# Patient Record
Sex: Female | Born: 1960 | Race: White | Hispanic: No | Marital: Married | Smoking: Current every day smoker
Health system: Southern US, Community
[De-identification: ages and names within clinical notes are randomized; demographics above are authoritative.]

## PROBLEM LIST (undated history)

## (undated) DIAGNOSIS — R569 Unspecified convulsions: Secondary | ICD-10-CM

## (undated) DIAGNOSIS — M5137 Other intervertebral disc degeneration, lumbosacral region: Secondary | ICD-10-CM

## (undated) DIAGNOSIS — M5136 Other intervertebral disc degeneration, lumbar region: Secondary | ICD-10-CM

## (undated) DIAGNOSIS — M51379 Other intervertebral disc degeneration, lumbosacral region without mention of lumbar back pain or lower extremity pain: Secondary | ICD-10-CM

## (undated) DIAGNOSIS — C719 Malignant neoplasm of brain, unspecified: Secondary | ICD-10-CM

## (undated) DIAGNOSIS — C801 Malignant (primary) neoplasm, unspecified: Secondary | ICD-10-CM

## (undated) DIAGNOSIS — K219 Gastro-esophageal reflux disease without esophagitis: Secondary | ICD-10-CM

## (undated) DIAGNOSIS — F419 Anxiety disorder, unspecified: Secondary | ICD-10-CM

## (undated) DIAGNOSIS — M1990 Unspecified osteoarthritis, unspecified site: Secondary | ICD-10-CM

## (undated) HISTORY — DX: Anxiety disorder, unspecified: F41.9

## (undated) HISTORY — DX: Unspecified convulsions: R56.9

## (undated) HISTORY — PX: ABDOMINAL HYSTERECTOMY: SHX81

## (undated) HISTORY — PX: OTHER SURGICAL HISTORY: SHX169

## (undated) HISTORY — PX: CHOLECYSTECTOMY: SHX55

## (undated) HISTORY — PX: APPENDECTOMY: SHX54

## (undated) HISTORY — PX: EXCISIONAL HEMORRHOIDECTOMY: SHX1541

---

## 2011-04-08 ENCOUNTER — Ambulatory Visit: Payer: Self-pay | Admitting: Internal Medicine

## 2011-04-20 ENCOUNTER — Ambulatory Visit: Payer: Self-pay | Admitting: Internal Medicine

## 2011-04-20 LAB — CBC CANCER CENTER
Basophil #: 0 x10 3/mm (ref 0.0–0.1)
Basophil %: 0.6 %
Eosinophil #: 0.2 x10 3/mm (ref 0.0–0.7)
Eosinophil %: 3.8 %
HCT: 37.9 % (ref 35.0–47.0)
HGB: 13 g/dL (ref 12.0–16.0)
Lymphocyte #: 1.8 x10 3/mm (ref 1.0–3.6)
Lymphocyte %: 35.2 %
MCH: 31.4 pg (ref 26.0–34.0)
MCHC: 34.4 g/dL (ref 32.0–36.0)
MCV: 91 fL (ref 80–100)
Monocyte #: 0.5 x10 3/mm (ref 0.0–0.7)
Monocyte %: 9.2 %
Neutrophil #: 2.6 x10 3/mm (ref 1.4–6.5)
Neutrophil %: 51.2 %
Platelet: 242 x10 3/mm (ref 150–440)
RBC: 4.15 10*6/uL (ref 3.80–5.20)
RDW: 13.4 % (ref 11.5–14.5)
WBC: 5.2 x10 3/mm (ref 3.6–11.0)

## 2011-04-20 LAB — COMPREHENSIVE METABOLIC PANEL
Albumin: 3.1 g/dL — ABNORMAL LOW (ref 3.4–5.0)
Alkaline Phosphatase: 102 U/L (ref 50–136)
Anion Gap: 7 (ref 7–16)
BUN: 14 mg/dL (ref 7–18)
Bilirubin,Total: 0.1 mg/dL — ABNORMAL LOW (ref 0.2–1.0)
Calcium, Total: 8.7 mg/dL (ref 8.5–10.1)
Chloride: 105 mmol/L (ref 98–107)
Co2: 31 mmol/L (ref 21–32)
Creatinine: 0.68 mg/dL (ref 0.60–1.30)
EGFR (African American): >60
EGFR (Non-African Amer.): >60
Glucose: 98 mg/dL (ref 65–99)
Osmolality: 285 (ref 275–301)
Potassium: 4 mmol/L (ref 3.5–5.1)
SGOT(AST): 14 U/L — ABNORMAL LOW (ref 15–37)
SGPT (ALT): 34 U/L
Sodium: 143 mmol/L (ref 136–145)
Total Protein: 7.4 g/dL (ref 6.4–8.2)

## 2011-04-20 LAB — APTT: Activated PTT: 31.5 secs (ref 23.6–35.9)

## 2011-04-20 LAB — PROTIME-INR
INR: 0.9
Prothrombin Time: 12.1 secs (ref 11.5–14.7)

## 2011-04-30 ENCOUNTER — Ambulatory Visit: Payer: Self-pay | Admitting: Internal Medicine

## 2011-10-18 ENCOUNTER — Ambulatory Visit: Payer: Self-pay | Admitting: Internal Medicine

## 2011-10-18 LAB — COMPREHENSIVE METABOLIC PANEL
Albumin: 3.7 g/dL (ref 3.4–5.0)
Alkaline Phosphatase: 69 U/L (ref 50–136)
Anion Gap: 10 (ref 7–16)
BUN: 21 mg/dL — ABNORMAL HIGH (ref 7–18)
Bilirubin,Total: 0.2 mg/dL (ref 0.2–1.0)
Calcium, Total: 9.4 mg/dL (ref 8.5–10.1)
Chloride: 102 mmol/L (ref 98–107)
Co2: 30 mmol/L (ref 21–32)
Creatinine: 0.71 mg/dL (ref 0.60–1.30)
EGFR (African American): >60
EGFR (Non-African Amer.): >60
Glucose: 91 mg/dL (ref 65–99)
Osmolality: 286 (ref 275–301)
Potassium: 4.3 mmol/L (ref 3.5–5.1)
SGOT(AST): 12 U/L — ABNORMAL LOW (ref 15–37)
SGPT (ALT): 20 U/L (ref 12–78)
Sodium: 142 mmol/L (ref 136–145)
Total Protein: 7.6 g/dL (ref 6.4–8.2)

## 2011-10-18 LAB — CBC CANCER CENTER
Basophil #: 0 x10 3/mm (ref 0.0–0.1)
Basophil %: 0.5 %
Eosinophil #: 0.2 x10 3/mm (ref 0.0–0.7)
Eosinophil %: 3.8 %
HCT: 41.2 % (ref 35.0–47.0)
HGB: 13.6 g/dL (ref 12.0–16.0)
Lymphocyte #: 2.4 x10 3/mm (ref 1.0–3.6)
Lymphocyte %: 37.3 %
MCH: 30.9 pg (ref 26.0–34.0)
MCHC: 32.9 g/dL (ref 32.0–36.0)
MCV: 94 fL (ref 80–100)
Monocyte #: 0.6 x10 3/mm (ref 0.2–0.9)
Monocyte %: 9.7 %
Neutrophil #: 3.1 x10 3/mm (ref 1.4–6.5)
Neutrophil %: 48.7 %
Platelet: 204 x10 3/mm (ref 150–440)
RBC: 4.39 10*6/uL (ref 3.80–5.20)
RDW: 13.7 % (ref 11.5–14.5)
WBC: 6.3 x10 3/mm (ref 3.6–11.0)

## 2011-10-31 ENCOUNTER — Ambulatory Visit: Payer: Self-pay | Admitting: Internal Medicine

## 2013-02-18 ENCOUNTER — Emergency Department: Payer: Self-pay | Admitting: Emergency Medicine

## 2013-02-18 LAB — CBC
HCT: 39.6 % (ref 35.0–47.0)
HGB: 12.6 g/dL (ref 12.0–16.0)
MCH: 29.6 pg (ref 26.0–34.0)
MCHC: 31.8 g/dL — ABNORMAL LOW (ref 32.0–36.0)
MCV: 93 fL (ref 80–100)
Platelet: 230 10*3/uL (ref 150–440)
RBC: 4.25 10*6/uL (ref 3.80–5.20)
RDW: 13.8 % (ref 11.5–14.5)
WBC: 14.7 10*3/uL — ABNORMAL HIGH (ref 3.6–11.0)

## 2013-02-18 LAB — BASIC METABOLIC PANEL
Anion Gap: 3 — ABNORMAL LOW (ref 7–16)
BUN: 33 mg/dL — ABNORMAL HIGH (ref 7–18)
Calcium, Total: 8.6 mg/dL (ref 8.5–10.1)
Chloride: 104 mmol/L (ref 98–107)
Co2: 30 mmol/L (ref 21–32)
Creatinine: 0.78 mg/dL (ref 0.60–1.30)
EGFR (African American): >60
EGFR (Non-African Amer.): >60
Glucose: 108 mg/dL — ABNORMAL HIGH (ref 65–99)
Osmolality: 282 (ref 275–301)
Potassium: 4 mmol/L (ref 3.5–5.1)
Sodium: 137 mmol/L (ref 136–145)

## 2013-02-18 LAB — TROPONIN I
Troponin-I: <0.02 ng/mL
Troponin-I: <0.02 ng/mL

## 2013-07-04 ENCOUNTER — Other Ambulatory Visit: Payer: Self-pay | Admitting: Obstetrics & Gynecology

## 2013-08-06 ENCOUNTER — Telehealth: Payer: Self-pay | Admitting: *Deleted

## 2013-08-09 MED ORDER — ESTRADIOL 0.52 MG/0.87 GM (0.06%) TD GEL: 1.0000 "application " | Freq: Every day | TRANSDERMAL | Status: DC

## 2013-08-09 NOTE — Telephone Encounter (Signed)
elestrin e prescribed

## 2013-08-20 ENCOUNTER — Encounter: Payer: Self-pay | Admitting: Obstetrics & Gynecology

## 2013-08-20 ENCOUNTER — Ambulatory Visit (INDEPENDENT_AMBULATORY_CARE_PROVIDER_SITE_OTHER): Payer: Medicare Other | Admitting: Obstetrics & Gynecology

## 2013-08-20 VITALS — BP 120/80 | Temp 98.4°F | Ht 64.0 in | Wt 168.0 lb

## 2013-08-20 DIAGNOSIS — N951 Menopausal and female climacteric states: Secondary | ICD-10-CM | POA: Insufficient documentation

## 2013-08-20 MED ORDER — ESTRADIOL 0.52 MG/0.87 GM (0.06%) TD GEL: TRANSDERMAL | Status: DC

## 2013-08-20 NOTE — Progress Notes (Signed)
Patient ID: Jill Harrell, female   DOB: Jan 15, 1961, 53 y.o.   MRN: [id number] Temperature 98.4 F (36.9 C), height 5\' 4"  (1.626 m), weight 168 lb (76.204 kg).  Pt here for refill of elestrin topically Doing well on it, continue two squirts daily  No complaints with it or problems  No burning with urination, frequency or urgency No nausea, vomiting or diarrhea Nor fever chills or other constitutional symptoms   No exam today   Menopausal symptoms: continue elestrin

## 2013-09-18 ENCOUNTER — Other Ambulatory Visit: Payer: Self-pay | Admitting: *Deleted

## 2013-09-18 MED ORDER — ESTRADIOL 0.52 MG/0.87 GM (0.06%) TD GEL: TRANSDERMAL | Status: DC

## 2013-09-24 ENCOUNTER — Telehealth: Payer: Self-pay | Admitting: Obstetrics & Gynecology

## 2013-09-25 MED ORDER — ESTRADIOL 0.52 MG/0.87 GM (0.06%) TD GEL: TRANSDERMAL | Status: DC

## 2013-09-25 NOTE — Telephone Encounter (Signed)
Double her elestrin

## 2013-09-25 NOTE — Telephone Encounter (Signed)
Left message letting pt know Dr. Elonda Husky sent new Rx electronically. Pt to double the Elestrin. [place]

## 2013-10-01 ENCOUNTER — Telehealth: Payer: Self-pay | Admitting: Obstetrics and Gynecology

## 2013-10-01 ENCOUNTER — Telehealth: Payer: Self-pay | Admitting: *Deleted

## 2013-10-01 MED ORDER — ESTRADIOL 2 MG PO TABS: 2.0000 mg | ORAL_TABLET | Freq: Every day | ORAL | Status: DC

## 2013-10-01 NOTE — Telephone Encounter (Signed)
Pt's insurance called and informed me that the pt would need either the tablet or the patch form of the estradiol. The insurance will cover 0.5mg , 1mg , 2mg  strength of the pill and a 90 day supply would only cost the pt around $27 and the weekly or biweekly patch any strength for a 90 day supply would cost the pt around $117. Can we please send this to the pt's pharmacy?

## 2013-10-01 NOTE — Telephone Encounter (Signed)
2mg  estradiol e prescribed

## 2013-10-01 NOTE — Telephone Encounter (Signed)
Pt aware that medication has been sent to the pharmacy.

## 2013-10-03 NOTE — Telephone Encounter (Signed)
Pt states that she had no issues getting her medication and has started taking it as directed.

## 2013-10-08 ENCOUNTER — Emergency Department (HOSPITAL_COMMUNITY): Payer: Medicare Other

## 2013-10-08 ENCOUNTER — Encounter (HOSPITAL_COMMUNITY): Payer: Self-pay | Admitting: Emergency Medicine

## 2013-10-08 ENCOUNTER — Emergency Department (HOSPITAL_COMMUNITY)
Admission: EM | Admit: 2013-10-08 | Discharge: 2013-10-08 | Disposition: A | Discharge status: Home / Self Care | Payer: Medicare Other | Attending: Emergency Medicine | Admitting: Emergency Medicine

## 2013-10-08 DIAGNOSIS — F17200 Nicotine dependence, unspecified, uncomplicated: Secondary | ICD-10-CM | POA: Insufficient documentation

## 2013-10-08 DIAGNOSIS — M129 Arthropathy, unspecified: Secondary | ICD-10-CM | POA: Insufficient documentation

## 2013-10-08 DIAGNOSIS — F411 Generalized anxiety disorder: Secondary | ICD-10-CM | POA: Insufficient documentation

## 2013-10-08 DIAGNOSIS — K5900 Constipation, unspecified: Secondary | ICD-10-CM | POA: Diagnosis not present

## 2013-10-08 DIAGNOSIS — M5430 Sciatica, unspecified side: Secondary | ICD-10-CM | POA: Diagnosis not present

## 2013-10-08 DIAGNOSIS — M545 Low back pain, unspecified: Secondary | ICD-10-CM | POA: Diagnosis present

## 2013-10-08 DIAGNOSIS — M5441 Lumbago with sciatica, right side: Secondary | ICD-10-CM

## 2013-10-08 DIAGNOSIS — Z85841 Personal history of malignant neoplasm of brain: Secondary | ICD-10-CM | POA: Insufficient documentation

## 2013-10-08 DIAGNOSIS — Z79899 Other long term (current) drug therapy: Secondary | ICD-10-CM | POA: Insufficient documentation

## 2013-10-08 DIAGNOSIS — Z7982 Long term (current) use of aspirin: Secondary | ICD-10-CM | POA: Diagnosis not present

## 2013-10-08 HISTORY — DX: Unspecified osteoarthritis, unspecified site: M19.90

## 2013-10-08 HISTORY — DX: Malignant neoplasm of brain, unspecified: C71.9

## 2013-10-08 HISTORY — DX: Malignant (primary) neoplasm, unspecified: C80.1

## 2013-10-08 MED ORDER — CYCLOBENZAPRINE HCL 10 MG PO TABS: 10.0000 mg | ORAL_TABLET | Freq: Two times a day (BID) | ORAL | Status: DC | PRN

## 2013-10-08 MED ORDER — OXYCODONE-ACETAMINOPHEN 5-325 MG PO TABS
2.0000 | ORAL_TABLET | Freq: Once | ORAL | Status: AC
  Administered 2013-10-08: 2 via ORAL
  Filled 2013-10-08: qty 2

## 2013-10-08 MED ORDER — OXYCODONE-ACETAMINOPHEN 5-325 MG PO TABS: 2.0000 | ORAL_TABLET | ORAL | Status: DC | PRN

## 2013-10-08 MED ORDER — IBUPROFEN 800 MG PO TABS: 800.0000 mg | ORAL_TABLET | Freq: Three times a day (TID) | ORAL | Status: DC

## 2013-10-08 NOTE — ED Notes (Signed)
Pt missed a step going down some stairs, did not fall, occurred about a week ago, c/o lower back pain, severe pain with walking and unable to straighten up while up

## 2013-10-08 NOTE — Discharge Instructions (Signed)
X-ray of the spine was normal;  however, given the large amount of stool in your colon. Increase fluids, fruit, fiber.    Use over-the-counter constipation medications including MiraLax, Dulcolax, magnesium citrate, Fleet enema.   Medication for pain, muscle spasm, inflammation

## 2013-10-08 NOTE — ED Provider Notes (Signed)
CSN: [id number]     Arrival date & time 10/08/13  1202 History  This chart was scribed for Nat Christen, MD by Ludger Nutting, ED Scribe. This patient was seen in room APA04/APA04 and the patient's care was started 1:24 PM.    Chief Complaint  Patient presents with  . Back Pain    The history is provided by the patient. No language interpreter was used.    HPI Comments: Jill Harrell is a 53 y.o. female who presents to the Emergency Department complaining of 1 week of constant, unchanged right sided lower back pain with radiation down the right leg. Patient states the pain began when she missed a step walking down stairs. She denies falling or any other injuries. She has taken OTC medications without relief. She denies history of back surgery or chronic back pain. She denies numbness or weakness.   PCP Sharen Hones at [facility]    Past Medical History  Diagnosis Date  . Anxiety   . Cancer   . Brain cancer   . Arthritis    Past Surgical History  Procedure Laterality Date  . Abdominal hysterectomy    . Cholecystectomy    . Brain surgery x2    . Appendectomy     Family History  Problem Relation Age of Onset  . Heart disease Father   . Hypotension Father   . Diabetes Father    History  Substance Use Topics  . Smoking status: Current Every Day Smoker    Types: Cigarettes  . Smokeless tobacco: Not on file  . Alcohol Use: No   OB History   Grav Para Term Preterm Abortions TAB SAB Ect Mult Living                 Review of Systems  A complete 10 system review of systems was obtained and all systems are negative except as noted in the HPI and PMH.    Allergies  Review of patient's allergies indicates no known allergies.  Home Medications   Prior to Admission medications   Medication Sig Start Date End Date Taking? Authorizing Provider  ALPRAZolam [name]) 1 MG tablet Take 1 mg by mouth 3 (three) times daily as needed for anxiety.    Yes Historical Provider, MD  aspirin 81 MG tablet  Take 81 mg by mouth daily.   Yes Historical Provider, MD  BLACK COHOSH PO Take 1 tablet by mouth daily.    Yes Historical Provider, MD  cyclobenzaprine (FLEXERIL) 5 MG tablet Take 5 mg by mouth 3 (three) times daily as needed for muscle spasms.   Yes Historical Provider, MD  divalproex (DEPAKOTE) 500 MG DR tablet Take 1,500 mg by mouth at bedtime.    Yes Historical Provider, MD  doxylamine, Sleep, (UNISOM) 25 MG tablet Take 25 mg by mouth at bedtime as needed for sleep.   Yes Historical Provider, MD  estradiol (ESTRACE) 2 MG tablet Take 1 tablet (2 mg total) by mouth daily. 10/01/13  Yes [name], MD  ferrous fumarate (HEMOCYTE - 106 MG FE) 325 (106 FE) MG TABS tablet Take 1 tablet by mouth.   Yes Historical Provider, MD  glucosamine-chondroitin 500-400 MG tablet Take 1 tablet by mouth daily.    Yes Historical Provider, MD  HYDROcodone-acetaminophen (NORCO) 10-325 MG per tablet Take 1 tablet by mouth every 6 (six) hours as needed.   Yes Historical Provider, MD  Magnesium Salicylate Tetrahyd (DOANS EXTRA STRENGTH) 580 MG TABS Take 2 tablets by mouth every 2 (  two) hours as needed (back pain).   Yes Historical Provider, MD  methadone (DOLOPHINE) 10 MG tablet Take 10 mg by mouth every 8 (eight) hours.   Yes Historical Provider, MD  zolpidem (AMBIEN) 5 MG tablet Take 5 mg by mouth at bedtime as needed for sleep.   Yes Historical Provider, MD  cyclobenzaprine (FLEXERIL) 10 MG tablet Take 1 tablet (10 mg total) by mouth 2 (two) times daily as needed for muscle spasms. 10/08/13   Nat Christen, MD  ibuprofen (ADVIL,MOTRIN) 800 MG tablet Take 1 tablet (800 mg total) by mouth 3 (three) times daily. 10/08/13   Nat Christen, MD  oxyCODONE-acetaminophen (PERCOCET) 5-325 MG per tablet Take 2 tablets by mouth every 4 (four) hours as needed. 10/08/13   Nat Christen, MD   BP 124/78  Pulse 70  Temp(Src) 98.6 F (37 C) (Oral)  Resp 17  Ht 5\' 4"  (1.626 m)  Wt 157 lb (71.215 kg)  BMI 26.94 kg/m2  SpO2 95% Physical Exam   Nursing note and vitals reviewed. Constitutional: She is oriented to person, place, and time. She appears well-developed and well-nourished.  HENT:  Head: Normocephalic and atraumatic.  Eyes: Conjunctivae and EOM are normal. Pupils are equal, round, and reactive to light.  Neck: Normal range of motion. Neck supple.  Cardiovascular: Normal rate, regular rhythm and normal heart sounds.   Pulmonary/Chest: Effort normal and breath sounds normal.  Abdominal: Soft. Bowel sounds are normal.  Musculoskeletal: Normal range of motion.  Tenderness to L3-4 region. Pain with movement of the right leg.   Neurological: She is alert and oriented to person, place, and time.  Skin: Skin is warm and dry.  Psychiatric: She has a normal mood and affect. Her behavior is normal.    ED Course  Procedures (including critical care time)  DIAGNOSTIC STUDIES: Oxygen Saturation is 100% on RA, normal by my interpretation.    COORDINATION OF CARE: 1:28 PM Will order XRAY of lumbar spine. Will prescribe pain medication and muscle relaxants.  Discussed treatment plan with pt at bedside and pt agreed to plan.  Labs Review Labs Reviewed - No data to display  Imaging Review Dg Lumbar Spine Complete  10/08/2013   CLINICAL DATA:  Low back pain radiating to the right buttock.  EXAM: LUMBAR SPINE - COMPLETE 4+ VIEW  COMPARISON:  02/18/2013  FINDINGS: Prominent stool throughout the colon favors constipation. No lumbar spine fracture or malalignment noted. No significant lumbar abnormality is observed.  IMPRESSION: 1. No significant lumbar spine abnormality is observed. 2.  Prominent stool throughout the colon favors constipation.   Electronically Signed   By: Sherryl Barters M.D.   On: 10/08/2013 14:41     EKG Interpretation None      MDM   Final diagnoses:  Right-sided low back pain with right-sided sciatica  Constipation, unspecified constipation type    No bowel or bladder incontinence. Plain films of  lumbar spine negative for bony abnormalities. Excessive stool noted. Discharge medications Percocet, Flexeril 10 mg, ibuprofen 800 mg  I personally performed the services described in this documentation, which was scribed in my presence. The recorded information has been reviewed and is accurate.   Nat Christen, MD 10/08/13 [phone]

## 2013-11-09 ENCOUNTER — Ambulatory Visit: Payer: Self-pay | Admitting: Oncology

## 2013-12-19 ENCOUNTER — Telehealth: Payer: Self-pay | Admitting: Obstetrics & Gynecology

## 2013-12-19 NOTE — Telephone Encounter (Signed)
Pt states the Estradiol tablets are not working is there an alternative?

## 2013-12-26 NOTE — Telephone Encounter (Signed)
That is all her insurance will cover, she can try taking 1 twice a day for a while and see if that helps, then let us know

## 2013-12-27 ENCOUNTER — Telehealth: Payer: Self-pay | Admitting: Obstetrics & Gynecology

## 2013-12-27 NOTE — Telephone Encounter (Signed)
Pt informed per Dr. Elonda Husky to take her Estradiol 1 twice a day for a while to see if there is improvement. Pt states will be getting new insurance coverage.

## 2014-01-08 ENCOUNTER — Ambulatory Visit (INDEPENDENT_AMBULATORY_CARE_PROVIDER_SITE_OTHER): Payer: Medicare Other | Admitting: Obstetrics & Gynecology

## 2014-01-08 ENCOUNTER — Encounter: Payer: Self-pay | Admitting: Obstetrics & Gynecology

## 2014-01-08 VITALS — BP 120/80 | Ht 64.0 in | Wt 169.0 lb

## 2014-01-08 DIAGNOSIS — Z1211 Encounter for screening for malignant neoplasm of colon: Secondary | ICD-10-CM

## 2014-01-08 DIAGNOSIS — Z01419 Encounter for gynecological examination (general) (routine) without abnormal findings: Secondary | ICD-10-CM

## 2014-01-08 DIAGNOSIS — Z1212 Encounter for screening for malignant neoplasm of rectum: Secondary | ICD-10-CM

## 2014-01-08 MED ORDER — ESTRADIOL 0.52 MG/0.87 GM (0.06%) TD GEL: TRANSDERMAL | Status: DC

## 2014-01-08 MED ORDER — ESTRADIOL 2 MG PO TABS: 2.0000 mg | ORAL_TABLET | Freq: Every day | ORAL | Status: DC

## 2014-01-08 NOTE — Progress Notes (Signed)
Patient ID: Jill Harrell, female   DOB: 03-16-1960, 53 y.o.   MRN: [id number] Subjective:     Jill Harrell is a 53 y.o. female here for a routine exam.  No LMP recorded. Patient has had a hysterectomy. No obstetric history on file. Birth Control Method:  n/a Menstrual Calendar(currently): n/a  Current complaints: Hot flashes worsening over past 1 to 2 months, previously taking Estradiol 2mg  PO daily, called and advised to increase to Estradiol 2mg  PO BID without significant relief. Also reports, "skin bump" on Left Labial region noticed about 5 weeks ago, some improvement, initially felt like a "hard bump" tender to touch, without any redness, itching, draining.  PMH: - History of brain cancer, followed by [facility] s/p surgical resection 2008, chemo, continues with MRI Head q 9 months   Recent Gynecologic History No LMP recorded. Patient has had a hysterectomy. Last Pap: n/a Last mammogram: 03/2013,  normal  Past Medical History  Diagnosis Date  . Anxiety   . Cancer   . Brain cancer   . Arthritis     Past Surgical History  Procedure Laterality Date  . Abdominal hysterectomy    . Cholecystectomy    . Brain surgery x2    . Appendectomy      OB History    No data available      History   Social History  . Marital Status: Married    Spouse Name: N/A    Number of Children: N/A  . Years of Education: N/A   Social History Main Topics  . Smoking status: Current Every Day Smoker    Types: Cigarettes  . Smokeless tobacco: None  . Alcohol Use: No  . Drug Use: No  . Sexual Activity: None   Other Topics Concern  . None   Social History Narrative    Family History  Problem Relation Age of Onset  . Heart disease Father   . Hypotension Father   . Diabetes Father     Current outpatient prescriptions: ALPRAZolam (XANAX) 1 MG tablet, Take 1 mg by mouth 3 (three) times daily as needed for anxiety. , Disp: , Rfl: ;  aspirin 81 MG tablet, Take 81 mg by mouth daily., Disp: ,  Rfl: ;  BLACK COHOSH PO, Take 1 tablet by mouth daily. , Disp: , Rfl: ;  divalproex (DEPAKOTE) 500 MG DR tablet, Take 1,500 mg by mouth at bedtime. , Disp: , Rfl:  doxylamine, Sleep, (UNISOM) 25 MG tablet, Take 25 mg by mouth at bedtime as needed for sleep., Disp: , Rfl: ;  estradiol (ESTRACE) 2 MG tablet, Take 1 tablet (2 mg total) by mouth daily., Disp: 90 tablet, Rfl: 3;  ferrous fumarate (HEMOCYTE - 106 MG FE) 325 (106 FE) MG TABS tablet, Take 1 tablet by mouth., Disp: , Rfl: ;  HYDROcodone-acetaminophen (NORCO) 10-325 MG per tablet, Take 1 tablet by mouth every 6 (six) hours as needed., Disp: , Rfl:  methadone (DOLOPHINE) 10 MG tablet, Take 10 mg by mouth every 8 (eight) hours., Disp: , Rfl: ;  cyclobenzaprine (FLEXERIL) 10 MG tablet, Take 1 tablet (10 mg total) by mouth 2 (two) times daily as needed for muscle spasms., Disp: 15 tablet, Rfl: 0;  cyclobenzaprine (FLEXERIL) 5 MG tablet, Take 5 mg by mouth 3 (three) times daily as needed for muscle spasms., Disp: , Rfl:  Estradiol (ELESTRIN) 0.52 MG/0.87 GM (0.06%) GEL, Apply 2 squirts topically daily, Disp: 2 Bottle, Rfl: 11;  glucosamine-chondroitin 500-400 MG tablet, Take 1 tablet  by mouth daily. , Disp: , Rfl: ;  ibuprofen (ADVIL,MOTRIN) 800 MG tablet, Take 1 tablet (800 mg total) by mouth 3 (three) times daily., Disp: 15 tablet, Rfl: 0 Magnesium Salicylate Tetrahyd (DOANS EXTRA STRENGTH) 580 MG TABS, Take 2 tablets by mouth every 2 (two) hours as needed (back pain)., Disp: , Rfl: ;  oxyCODONE-acetaminophen (PERCOCET) 5-325 MG per tablet, Take 2 tablets by mouth every 4 (four) hours as needed., Disp: 15 tablet, Rfl: 0;  zolpidem (AMBIEN) 5 MG tablet, Take 5 mg by mouth at bedtime as needed for sleep., Disp: , Rfl:   Review of Systems  Review of Systems  Constitutional: Negative for fever, chills, weight loss, malaise/fatigue and diaphoresis.  HENT: Negative for hearing loss, ear pain, nosebleeds, congestion, sore throat, neck pain, tinnitus and ear  discharge.   Eyes: Negative for blurred vision, double vision, photophobia, pain, discharge and redness.  Respiratory: Negative for cough, hemoptysis, sputum production, shortness of breath, wheezing and stridor.   Cardiovascular: Negative for chest pain, palpitations, orthopnea, claudication, leg swelling and PND.  Gastrointestinal: negative for abdominal pain. Negative for heartburn, nausea, vomiting, diarrhea, constipation, blood in stool and melena.  Genitourinary: Negative for dysuria, urgency, frequency, hematuria and flank pain.  Musculoskeletal: Negative for myalgias, back pain, joint pain and falls.  Skin: Negative for itching and rash.  Neurological: Negative for dizziness, tingling, tremors, sensory change, speech change, focal weakness, seizures, loss of consciousness, weakness and headaches.  Endo/Heme/Allergies: Negative for environmental allergies and polydipsia. Does not bruise/bleed easily.  Psychiatric/Behavioral: Negative for depression, suicidal ideas, hallucinations, memory loss and substance abuse. The patient is not nervous/anxious and does not have insomnia.        Objective:  Blood pressure 120/80, height 5\' 4"  (1.626 m), weight 169 lb (76.658 kg).   Physical Exam  Vitals reviewed. Constitutional: She is oriented to person, place, and time. She appears well-developed and well-nourished.  HENT:  Head: Normocephalic and atraumatic.        Right Ear: External ear normal.  Left Ear: External ear normal.  Nose: Nose normal.  Mouth/Throat: Oropharynx is clear and moist.  Eyes: Conjunctivae and EOM are normal. Pupils are equal, round, and reactive to light. Right eye exhibits no discharge. Left eye exhibits no discharge. No scleral icterus.  Neck: Normal range of motion. Neck supple. No tracheal deviation present. No thyromegaly present.  Cardiovascular: Normal rate, regular rhythm, normal heart sounds and intact distal pulses.  Exam reveals no gallop and no friction rub.    No murmur heard. Respiratory: Effort normal and breath sounds normal. No respiratory distress. She has no wheezes. She has no rales. She exhibits no tenderness.  GI: Soft. Bowel sounds are normal. She exhibits no distension and no mass. There is no tenderness. There is no rebound and no guarding.  Genitourinary:  Breasts no masses skin changes or nipple changes bilaterally      Vulva is normal without lesions Vagina is pink moist without discharge Cervix surgically removed with hysterectomy, no abnormalities on exam. Pap not performed due to no cervix. Uterus is normal size shape and contour Adnexa surgically removed, some residual chronic tenderness on Right side, without mass. Rectal   hemoccult negative, normal tone, no masses  Musculoskeletal: Normal range of motion. She exhibits no edema and no tenderness.  Neurological: She is alert and oriented to person, place, and time. She has normal reflexes. She displays normal reflexes. No cranial nerve deficit. She exhibits normal muscle tone. Coordination normal.  Skin: Skin is  warm and dry. No rash noted. No erythema. No pallor.  Psychiatric: She has a normal mood and affect. Her behavior is normal. Judgment and thought content normal.       Assessment:    Healthy female exam.    Plan:    Education reviewed: depression evaluation, low fat, low cholesterol diet, self breast exams and smoking cessation. Hormone replacement therapy: hormone replacement therapy: Resume Estradiol gel daily (previously non-adherent), continue Esradial PO 2mg  daily (stop taking BID). Mammogram ordered. Follow up in: 1 year.

## 2014-01-16 ENCOUNTER — Telehealth: Payer: Self-pay | Admitting: Obstetrics & Gynecology

## 2014-01-16 NOTE — Telephone Encounter (Signed)
Pt states her pharmacy said she need PA on Elestrin gel Dr. Elonda Husky prescribed.

## 2014-02-01 ENCOUNTER — Telehealth: Payer: Self-pay | Admitting: *Deleted

## 2014-02-01 NOTE — Telephone Encounter (Signed)
Pt informed insurance would not give Prior Auth for Elestrin gel but would cover Estradiol Gel generic. Pt aware will call RX to pharmacy today. Pt verbalized understanding.

## 2014-02-01 NOTE — Telephone Encounter (Signed)
Pt insurance will not cover Elestrin gel, will only cover estrace cream or estradiol patches please advise.

## 2014-02-16 ENCOUNTER — Emergency Department (HOSPITAL_COMMUNITY)
Admission: EM | Admit: 2014-02-16 | Discharge: 2014-02-16 | Disposition: A | Discharge status: Home / Self Care | Payer: Medicare Other | Attending: Emergency Medicine | Admitting: Emergency Medicine

## 2014-02-16 ENCOUNTER — Encounter (HOSPITAL_COMMUNITY): Payer: Self-pay | Admitting: Emergency Medicine

## 2014-02-16 DIAGNOSIS — Z85841 Personal history of malignant neoplasm of brain: Secondary | ICD-10-CM | POA: Insufficient documentation

## 2014-02-16 DIAGNOSIS — M5416 Radiculopathy, lumbar region: Secondary | ICD-10-CM | POA: Diagnosis not present

## 2014-02-16 DIAGNOSIS — M25551 Pain in right hip: Secondary | ICD-10-CM | POA: Diagnosis present

## 2014-02-16 DIAGNOSIS — M5431 Sciatica, right side: Secondary | ICD-10-CM | POA: Diagnosis not present

## 2014-02-16 MED ORDER — HYDROMORPHONE HCL 1 MG/ML IJ SOLN
1.0000 mg | Freq: Once | INTRAMUSCULAR | Status: AC
  Administered 2014-02-16: 1 mg via INTRAMUSCULAR
  Filled 2014-02-16: qty 1

## 2014-02-16 NOTE — Discharge Instructions (Signed)
Continue to take your pain medicines per normal. Follow-up with your primary doctor and specialist early this week as he may require MRI for further details of your pain. Return to the ER if you develop weakness, persistent numbness, bowel or bladder changes, fevers or other concerns.  If you were given medicines take as directed.  If you are on coumadin or contraceptives realize their levels and effectiveness is altered by many different medicines.  If you have any reaction (rash, tongues swelling, other) to the medicines stop taking and see a physician.   Please follow up as directed and return to the ER or see a physician for new or worsening symptoms.  Thank you. Filed Vitals:   02/16/14 0827  BP: 102/61  Pulse: 85  Temp: 97.7 F (36.5 C)  TempSrc: Oral  Resp: 16  Height: 5\' 4"  (1.626 m)  Weight: 147 lb (66.679 kg)  SpO2: 97%

## 2014-02-16 NOTE — ED Notes (Addendum)
Patient c/o right hip pain. Per patient seen chiropractor on Monday for herniated discs> Per patient pain started on Tuesday and she  had to travel to and from [facility]. Patient seen by chiropractor again on Friday but was unable to get adjustment due to swelling. Patient reports that chiropractor called neurologist for and appointment. Per patient she is now unable to straighten right leg and has pain that radiates behind right knee and in calf.

## 2014-02-16 NOTE — ED Provider Notes (Signed)
CSN: [id number]     Arrival date & time 02/16/14  0818 History  This chart was scribed for Mariea Clonts, MD by Stephania Fragmin, ED Scribe. This patient was seen in room APA18/APA18 and the patient's care was started at 8:43 AM.    Chief Complaint  Patient presents with  . Hip Pain   Patient is a 53 y.o. female presenting with hip pain. The history is provided by the spouse and the patient. No language interpreter was used.  Hip Pain This is a chronic problem. The current episode started more than 1 week ago. The problem occurs constantly. Treatments tried: Hydrocodone 10-325. The treatment provided mild relief.    HPI Comments: Jill Harrell is a 53 y.o. female with a history of glioblastoma who presents to the Emergency Department complaining of constant pain to her right leg extending to the arch of right foot -- on the front, back, and side -- onset over two months go. She complains of associated weakness in her legs and states that she was unable to lift her leg yesterday. Per husband, patient's disc is out, and her chiropractor usually pops it back in. However, her chiropractor wouldn't do it 3 days ago because her leg was swollen, and instead stated that she needs to see a neurologist. Per patient, her orthopedic doctor recommended she see a PT. Patient last had an MRI of her back done 3 weeks. She has tried Hydrocodone 10-325 with minimal relief, as well as Ambien to help her sleep, but she has run out of Ambien. Patient has glioblastoma, treated by Dr. Tommi Rumps at [facility]. She denies a history of back surgery. She also denies fever, chills, and incontinence. Patient states that she uses a walker. She appears and states that she is very sleepy while in the room.   Past Medical History  Diagnosis Date  . Anxiety   . Cancer   . Brain cancer   . Arthritis    Past Surgical History  Procedure Laterality Date  . Abdominal hysterectomy    . Cholecystectomy    . Brain surgery x2    .  Appendectomy     Family History  Problem Relation Age of Onset  . Heart disease Father   . Hypotension Father   . Diabetes Father    History  Substance Use Topics  . Smoking status: Current Every Day Smoker -- 1.00 packs/day for 23 years    Types: Cigarettes  . Smokeless tobacco: Never Used  . Alcohol Use: No   OB History    Gravida Para Term Preterm AB TAB SAB Ectopic Multiple Living            0     Review of Systems  Constitutional: Negative for fever and chills.  Musculoskeletal: Positive for myalgias.  Neurological: Positive for weakness.  All other systems reviewed and are negative.     Allergies  Review of patient's allergies indicates no known allergies.  Home Medications   Prior to Admission medications   Medication Sig Start Date End Date Taking? Authorizing Provider  ALPRAZolam [name]) 1 MG tablet Take 1 mg by mouth 3 (three) times daily as needed for anxiety.    Yes Historical Provider, MD  aspirin EC 81 MG tablet Take 81 mg by mouth daily.   Yes Historical Provider, MD  BLACK COHOSH PO Take 1 tablet by mouth daily.    Yes Historical Provider, MD  cyclobenzaprine (FLEXERIL) 5 MG tablet Take 5 mg by mouth 3 (  three) times daily as needed for muscle spasms.   Yes Historical Provider, MD  divalproex (DEPAKOTE) 500 MG DR tablet Take 1,500 mg by mouth at bedtime.    Yes Historical Provider, MD  doxylamine, Sleep, (UNISOM) 25 MG tablet Take 25 mg by mouth at bedtime as needed for sleep.   Yes Historical Provider, MD  estradiol (ESTRACE) 2 MG tablet Take 1 tablet (2 mg total) by mouth daily. 01/08/14  Yes [name], MD  ferrous fumarate (HEMOCYTE - 106 MG FE) 325 (106 FE) MG TABS tablet Take 1 tablet by mouth.   Yes Historical Provider, MD  glucosamine-chondroitin 500-400 MG tablet Take 1 tablet by mouth daily.    Yes Historical Provider, MD  HYDROcodone-acetaminophen (NORCO) 10-325 MG per tablet Take 1 tablet by mouth every 6 (six) hours as needed (pain).    Yes  Historical Provider, MD  methadone (DOLOPHINE) 10 MG tablet Take 10 mg by mouth every 8 (eight) hours.   Yes Historical Provider, MD  zolpidem (AMBIEN) 5 MG tablet Take 5 mg by mouth at bedtime as needed for sleep.   Yes Historical Provider, MD  cyclobenzaprine (FLEXERIL) 10 MG tablet Take 1 tablet (10 mg total) by mouth 2 (two) times daily as needed for muscle spasms. Patient not taking: Reported on 02/16/2014 10/08/13   Nat Christen, MD  Estradiol (ELESTRIN) 0.52 MG/0.87 GM (0.06%) GEL Apply 2 squirts topically daily Patient not taking: Reported on 02/16/2014 01/08/14   [name], MD  ibuprofen (ADVIL,MOTRIN) 800 MG tablet Take 1 tablet (800 mg total) by mouth 3 (three) times daily. Patient not taking: Reported on 02/16/2014 10/08/13   Nat Christen, MD  Magnesium Salicylate Tetrahyd (DOANS EXTRA STRENGTH) 580 MG TABS Take 2 tablets by mouth every 2 (two) hours as needed (back pain).    Historical Provider, MD  oxyCODONE-acetaminophen (PERCOCET) 5-325 MG per tablet Take 2 tablets by mouth every 4 (four) hours as needed. Patient not taking: Reported on 02/16/2014 10/08/13   Nat Christen, MD   BP 102/61 mmHg  Pulse 85  Temp(Src) 97.7 F (36.5 C) (Oral)  Resp 16  Ht 5\' 4"  (1.626 m)  Wt 147 lb (66.679 kg)  BMI 25.22 kg/m2  SpO2 97% Physical Exam  Constitutional: She is oriented to person, place, and time. She appears well-developed.  HENT:  Head: Normocephalic.  Eyes: Conjunctivae are normal.  Neck: No tracheal deviation present.  Cardiovascular:  No murmur heard. Equal 2+ pulses in patella and Achilles.   Musculoskeletal: Normal range of motion. She exhibits tenderness. She exhibits no edema.  Paraspinal lumbosacral junction tenderness.  Neurological: She is oriented to person, place, and time. GCS eye subscore is 4. GCS verbal subscore is 5. GCS motor subscore is 6.  Reflex Scores:      Patellar reflexes are 2+ on the right side and 2+ on the left side.      Achilles reflexes are 1+ on  the right side and 1+ on the left side. 5+ strength with flexor extension of knee, hip and toe with pain throughout exam. Left side is less painful.  Sensation intact in the major nerve areas of both legs; however, decreased in dorsum of foot.   Skin: Skin is warm.  Psychiatric: She has a normal mood and affect.  Nursing note and vitals reviewed.   ED Course  Procedures (including critical care time)  DIAGNOSTIC STUDIES: Oxygen Saturation is 97% on room air, normal by my interpretation.    COORDINATION OF CARE: 8:48  AM - Discussed treatment plan with pt at bedside and pt agreed to plan.   Labs Review Labs Reviewed - No data to display  Imaging Review No results found.   EKG Interpretation None      MDM   Final diagnoses:  Sciatica, right  Lumbar back pain with radiculopathy affecting right lower extremity    I personally performed the services described in this documentation, which was scribed in my presence. The recorded information has been reviewed and is accurate.  Patient with unfortunate brain cancer history presents with acute on chronic right back pain with radiation down posterior right leg. Patient has had this in the past however this is more severe. Patient has normal neuro exam of the lower extremities in the ER. No fever, vitals unremarkable. Patient has no history of spread of cancer to the bone.  Discussed strict reasons to return and close follow-up with oncologist and patient's primary Dr. as if symptoms do not improve she will need an MRI outpatient. Discussed strict reasons to return for emergent MRI.  On recheck patient comfortable pain control. Results and differential diagnosis were discussed with the patient/parent/guardian. Close follow up outpatient was discussed, comfortable with the plan.   Medications  HYDROmorphone (DILAUDID) injection 1 mg (1 mg Intramuscular Given 02/16/14 0914)    Filed Vitals:   02/16/14 0827  BP: 102/61  Pulse: 85   Temp: 97.7 F (36.5 C)  TempSrc: Oral  Resp: 16  Height: 5\' 4"  (1.626 m)  Weight: 147 lb (66.679 kg)  SpO2: 97%    Final diagnoses:  Sciatica, right  Lumbar back pain with radiculopathy affecting right lower extremity      Mariea Clonts, MD 02/16/14 1022

## 2014-04-26 ENCOUNTER — Encounter: Payer: Self-pay | Admitting: Family Medicine

## 2014-05-06 ENCOUNTER — Ambulatory Visit: Payer: Medicare Other | Admitting: Family Medicine

## 2014-05-06 ENCOUNTER — Ambulatory Visit (INDEPENDENT_AMBULATORY_CARE_PROVIDER_SITE_OTHER): Payer: Medicare Other | Admitting: Family Medicine

## 2014-05-06 ENCOUNTER — Encounter: Payer: Self-pay | Admitting: Family Medicine

## 2014-05-06 VITALS — BP 94/68 | HR 80 | Temp 97.7°F | Resp 18 | Ht 64.0 in | Wt 183.0 lb

## 2014-05-06 DIAGNOSIS — Z23 Encounter for immunization: Secondary | ICD-10-CM

## 2014-05-06 DIAGNOSIS — J019 Acute sinusitis, unspecified: Secondary | ICD-10-CM | POA: Diagnosis not present

## 2014-05-06 MED ORDER — AMOXICILLIN-POT CLAVULANATE 875-125 MG PO TABS: 1.0000 | ORAL_TABLET | Freq: Two times a day (BID) | ORAL | Status: DC

## 2014-05-06 MED ORDER — FLUCONAZOLE 150 MG PO TABS: 150.0000 mg | ORAL_TABLET | Freq: Once | ORAL | Status: DC

## 2014-05-06 NOTE — Progress Notes (Signed)
Subjective:    Patient ID: Jill Harrell, female    DOB: [birth year], 54 y.o.   MRN: [id number]  HPI Patient is a very pleasant 54 year old white female who is here today to establish care. She has a very complicated past medical history. 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 She underwent surgical resection on 2 separate occasions as well as chemotherapy. Patient is stage IV and incurable. However she has been in stable condition for more than 6 years.  Initially she was only given 6 months to live but that is more than 8 years ago. Due to the surgery, the patient does have a history of seizure disorder. She takes Depakote for seizure prevention. She has not had seizures in several years. She is on methadone and Norco for chronic pain. This is prescribed by her oncologist at [facility]. She also has generalized anxiety disorder for which she takes Xanax 3 times a day as well as Ambien to help her sleep. She has never taken an SSRI for prevention of her anxiety. At the present time she is not interested in starting one of these medication. Her biggest concern is a sinus infection. She reports pain in her frontal and maxillary sinuses that has been there since February. She reports postnasal drip and headaches and fever. She is taking Levaquin without relief. She is tried Flonase without relief. She is tried Allegra-D without relief.  Patient already has a colonoscopy scheduled at [facility] along with a Pap smear and mammogram. She does not need to schedule these things. Because her cancer has been stable for so many years she is proceeding with regular cancer screening. She is overdue for a pneumonia vaccine. Her flu shot is up-to-date. Her lab work is checked every month at the cancer center. Past Medical History  Diagnosis Date  . Anxiety   . Cancer   . Brain cancer     2009 glioblastoma (stage 4) Dr. Bayard Hugger  . Seizures   . Arthritis     hip and wrist   Past  Surgical History  Procedure Laterality Date  . Abdominal hysterectomy    . Cholecystectomy    . Brain surgery x2    . Appendectomy     Current Outpatient Prescriptions on File Prior to Visit  Medication Sig Dispense Refill  . ALPRAZolam (XANAX) 1 MG tablet Take 1 mg by mouth 3 (three) times daily as needed for anxiety.     . divalproex (DEPAKOTE) 500 MG DR tablet Take 1,500 mg by mouth at bedtime.     . fexofenadine-pseudoephedrine (ALLEGRA-D 24) 180-240 MG per 24 hr tablet Take 1 tablet by mouth daily.    Marland Kitchen HYDROcodone-acetaminophen (NORCO) 10-325 MG per tablet Take 1 tablet by mouth every 6 (six) hours as needed (pain).     . methadone (DOLOPHINE) 10 MG tablet Take 10 mg by mouth every 8 (eight) hours.    Marland Kitchen zolpidem (AMBIEN) 5 MG tablet Take 5 mg by mouth at bedtime as needed for sleep.     No current facility-administered medications on file prior to visit.   No Known Allergies History   Social History  . Marital Status: Married    Spouse Name: N/A  . Number of Children: N/A  . Years of Education: N/A   Occupational History  . Not on file.   Social History Main Topics  . Smoking status: Current Every Day Smoker -- 1.00 packs/day for 23 years    Types: Cigarettes  .  Smokeless tobacco: Never Used  . Alcohol Use: No  . Drug Use: No  . Sexual Activity: Yes    Birth Control/ Protection: Other-see comments   Other Topics Concern  . Not on file   Social History Narrative   Family History  Problem Relation Age of Onset  . Heart disease Father   . Hypotension Father   . Diabetes Father   . Hyperlipidemia Maternal Grandfather   . Hypertension Maternal Grandfather   . Heart disease Maternal Grandfather       Review of Systems  All other systems reviewed and are negative.      Objective:   Physical Exam  Constitutional: She appears well-developed and well-nourished. No distress.  HENT:  Head: Normocephalic and atraumatic.  Right Ear: External ear normal.    Left Ear: External ear normal.  Nose: Mucosal edema and rhinorrhea present. Right sinus exhibits maxillary sinus tenderness and frontal sinus tenderness. Left sinus exhibits maxillary sinus tenderness and frontal sinus tenderness.  Mouth/Throat: Oropharynx is clear and moist. No oropharyngeal exudate.  Neck: Neck supple.  Cardiovascular: Normal rate, regular rhythm and normal heart sounds.   Pulmonary/Chest: Effort normal and breath sounds normal. No respiratory distress. She has no wheezes. She has no rales.  Abdominal: Soft. Bowel sounds are normal.  Lymphadenopathy:    She has no cervical adenopathy.  Skin: She is not diaphoretic.  Vitals reviewed.         Assessment & Plan:  Acute rhinosinusitis - Plan: amoxicillin-clavulanate (AUGMENTIN) 875-125 MG per tablet, fluconazole (DIFLUCAN) 150 MG tablet  Need for prophylactic vaccination against Streptococcus pneumoniae (pneumococcus) - Plan: Pneumococcal polysaccharide vaccine 23-valent greater than or equal to 2yo subcutaneous/IM  Begin Augmentin 875 mg by mouth twice a day for 10 days. Also gave the patient prescription for Diflucan to be used if needed for yeast infection. If this does not help, the patient would probably benefit from a steroid taper pack however I would want to run this by her physician at [facility] prior to giving the patient's steroids having just met her. Patient also received Pneumovax 23 today. I will give her Prevnar 13 in one year. The remainder of her lab work in her cancer screening is up-to-date and being managed at [facility]. Therefore I'll be glad to see the patient annually and as needed.

## 2014-05-16 ENCOUNTER — Other Ambulatory Visit: Payer: Self-pay | Admitting: *Deleted

## 2014-05-16 MED ORDER — ESTRADIOL 1 MG/GM TD GEL: 1.0000 | Freq: Every day | TRANSDERMAL | Status: DC

## 2014-05-20 ENCOUNTER — Ambulatory Visit (INDEPENDENT_AMBULATORY_CARE_PROVIDER_SITE_OTHER): Payer: Medicare Other | Admitting: Obstetrics & Gynecology

## 2014-05-20 ENCOUNTER — Other Ambulatory Visit: Payer: Self-pay | Admitting: Obstetrics & Gynecology

## 2014-05-20 VITALS — BP 100/70 | HR 72 | Wt 176.0 lb

## 2014-05-20 DIAGNOSIS — M940 Chondrocostal junction syndrome [Tietze]: Secondary | ICD-10-CM | POA: Diagnosis not present

## 2014-05-20 DIAGNOSIS — N951 Menopausal and female climacteric states: Secondary | ICD-10-CM

## 2014-05-20 DIAGNOSIS — Z1231 Encounter for screening mammogram for malignant neoplasm of breast: Secondary | ICD-10-CM

## 2014-05-20 MED ORDER — PIROXICAM 20 MG PO CAPS: 20.0000 mg | ORAL_CAPSULE | Freq: Every day | ORAL | Status: DC

## 2014-05-20 NOTE — Progress Notes (Signed)
Patient ID: Jill Harrell, female   DOB: 1960-03-17, 54 y.o.   MRN: [id number]   Chief Complaint  Patient presents with  . gyn visit    c/c lt breast sore. RX Estradiol gel.     HPI:    54 y.o. No obstetric history on file. No LMP recorded. Patient has had a hysterectomy.  Complaining of pain left breast for 6 weeks, she just had a normal mammogram in December Location:  Left > right breast. Quality:  sharp. Severity:  moderate. Timing:  episodic. Duration:  6 weeks. Context:  No preciptating or worsening issues. Modifying factors:   Signs/Symptoms:      Current outpatient prescriptions:  .  ALPRAZolam (XANAX) 1 MG tablet, Take 1 mg by mouth 3 (three) times daily as needed for anxiety. , Disp: , Rfl:  .  amoxicillin-clavulanate (AUGMENTIN) 875-125 MG per tablet, Take 1 tablet by mouth 2 (two) times daily., Disp: 20 tablet, Rfl: 0 .  aspirin EC 81 MG tablet, Take by mouth., Disp: , Rfl:  .  Calcium-Vitamin D 600-200 MG-UNIT per tablet, Take by mouth., Disp: , Rfl:  .  divalproex (DEPAKOTE) 500 MG DR tablet, Take 1,500 mg by mouth at bedtime. , Disp: , Rfl:  .  estradiol (ESTRACE) 2 MG tablet, Take 2 mg by mouth daily., Disp: , Rfl:  .  fexofenadine-pseudoephedrine (ALLEGRA-D 24) 180-240 MG per 24 hr tablet, Take 1 tablet by mouth daily., Disp: , Rfl:  .  fluticasone (FLONASE) 50 MCG/ACT nasal spray, Place 2 sprays into both nostrils daily., Disp: , Rfl:  .  HYDROcodone-acetaminophen (NORCO) 10-325 MG per tablet, Take 1 tablet by mouth every 6 (six) hours as needed (pain). , Disp: , Rfl:  .  methadone (DOLOPHINE) 10 MG tablet, Take 10 mg by mouth every 8 (eight) hours., Disp: , Rfl:  .  Multiple Vitamins-Minerals (MULTIVITAMIN WITH MINERALS) tablet, Take 1 tablet by mouth daily., Disp: , Rfl:  .  Omega-3 Fatty Acids (FISH OIL) 1000 MG CAPS, Take by mouth., Disp: , Rfl:  .  zolpidem (AMBIEN) 5 MG tablet, Take 5 mg by mouth at bedtime as needed for sleep., Disp: , Rfl:  .  Estradiol  (DIVIGEL) 1 MG/GM GEL, Place 1 packet onto the skin daily. (Patient not taking: Reported on 05/20/2014), Disp: 30 g, Rfl: 11 .  ferrous sulfate 325 (65 FE) MG tablet, Take by mouth., Disp: , Rfl:  .  fluconazole (DIFLUCAN) 150 MG tablet, Take 1 tablet (150 mg total) by mouth once. (Patient not taking: Reported on 05/20/2014), Disp: 1 tablet, Rfl: 0 .  piroxicam (FELDENE) 20 MG capsule, Take 1 capsule (20 mg total) by mouth daily., Disp: 30 capsule, Rfl: 1  Problem Pertinent ROS:       No breast discharge or erythema or evidence of infection, no hot areas No SOB No chest pain  Extended ROS:        PMFSH:             Past Medical History  Diagnosis Date  . Anxiety   . Cancer   . Brain cancer     2009 glioblastoma (stage 4) Dr. Bayard Hugger  . Seizures   . Arthritis     hip and wrist    Past Surgical History  Procedure Laterality Date  . Abdominal hysterectomy    . Cholecystectomy    . Brain surgery x2    . Appendectomy      OB History    Gravida Para Term Preterm  AB TAB SAB Ectopic Multiple Living            0      No Known Allergies  History   Social History  . Marital Status: Married    Spouse Name: N/A  . Number of Children: N/A  . Years of Education: N/A   Social History Main Topics  . Smoking status: Current Every Day Smoker -- 1.00 packs/day for 23 years    Types: Cigarettes  . Smokeless tobacco: Never Used  . Alcohol Use: No  . Drug Use: No  . Sexual Activity: Yes    Birth Control/ Protection: Other-see comments   Other Topics Concern  . Not on file   Social History Narrative    Family History  Problem Relation Age of Onset  . Heart disease Father   . Hypotension Father   . Diabetes Father   . Hyperlipidemia Maternal Grandfather   . Hypertension Maternal Grandfather   . Heart disease Maternal Grandfather      Examination:  Vitals:  Blood pressure 100/70, pulse 72, weight 176 lb (79.833 kg).    Physical Examination:     Breasts  isolated from chest wall, no pain in breasts themselves However there is tenderness in the chest wall when moving the breast around the area on the chest wall remains the same No skin or nipple changes   DATA orders and reviews: Labs were not ordered today:   Imaging studies were not ordered today:    Lab tests were not reviewed today:    Imaging studies were not reviewed today:    I did not independently review/view images, tracing or specimen(not simply the report) myself.  Prescription Drug Management:  New Prescriptions: Feldene 20 mg daily prn, for costochondritis Renewed Prescriptions:  Estradiol gel divigel 1mg  Current prescription changes:     Impression/Plan(Problem Based): 1.  costochondritis      (new problem) : Additional workup is needed:  Mammogram just to rule out association  2.  Menopausal symptoms      (follow up of a pre-existing problem:are improving) : Additional workup is not needed:  Divigel 1mg

## 2014-05-27 ENCOUNTER — Ambulatory Visit (HOSPITAL_COMMUNITY)
Admission: RE | Admit: 2014-05-27 | Discharge: 2014-05-27 | Disposition: A | Discharge status: Home / Self Care | Payer: Medicare Other | Source: Ambulatory Visit | Attending: Obstetrics & Gynecology | Admitting: Obstetrics & Gynecology

## 2014-05-27 DIAGNOSIS — Z1231 Encounter for screening mammogram for malignant neoplasm of breast: Secondary | ICD-10-CM | POA: Insufficient documentation

## 2014-06-24 ENCOUNTER — Encounter: Payer: Self-pay | Admitting: Family Medicine

## 2014-06-24 ENCOUNTER — Ambulatory Visit (INDEPENDENT_AMBULATORY_CARE_PROVIDER_SITE_OTHER): Payer: Medicare Other | Admitting: Family Medicine

## 2014-06-24 VITALS — BP 110/64 | HR 82 | Temp 97.8°F | Resp 18 | Ht 64.0 in | Wt 176.0 lb

## 2014-06-24 DIAGNOSIS — F411 Generalized anxiety disorder: Secondary | ICD-10-CM

## 2014-06-24 MED ORDER — VENLAFAXINE HCL ER 37.5 MG PO CP24: ORAL_CAPSULE | ORAL | Status: DC

## 2014-06-24 NOTE — Progress Notes (Signed)
Subjective:    Patient ID: Jill Harrell, female    DOB: Mar 18, 1960, 54 y.o.   MRN: [id number]  HPI 05/06/14 Patient is a very pleasant 54 year old white female who is here today to establish care. She has a very complicated past medical history. 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 She underwent surgical resection on 2 separate occasions as well as chemotherapy. Patient is stage IV and incurable. However she has been in stable condition for more than 6 years.  Initially she was only given 6 months to live but that is more than 8 years ago. Due to the surgery, the patient does have a history of seizure disorder. She takes Depakote for seizure prevention. She has not had seizures in several years. She is on methadone and Norco for chronic pain. This is prescribed by her oncologist at [facility]. She also has generalized anxiety disorder for which she takes Xanax 3 times a day as well as Ambien to help her sleep. She has never taken an SSRI for prevention of her anxiety. At the present time she is not interested in starting one of these medication. Her biggest concern is a sinus infection. She reports pain in her frontal and maxillary sinuses that has been there since February. She reports postnasal drip and headaches and fever. She is taking Levaquin without relief. She is tried Flonase without relief. She is tried Allegra-D without relief.  Patient already has a colonoscopy scheduled at [facility] along with a Pap smear and mammogram. She does not need me to schedule these things. Because her cancer has been stable for so many years she is proceeding with regular cancer screening. She is overdue for a pneumonia vaccine. Her flu shot is up-to-date. Her lab work is checked every month at the cancer center.  At that time, my plan was:  Begin Augmentin 52 mg by mouth twice a day for 10 days. Also gave the patient prescription for Diflucan to be used if needed for yeast infection. If  this does not help, the patient would probably benefit from a steroid taper pack however I would want to run this by her physician at [facility] prior to giving the patient's steroids having just met her. Patient also received Pneumovax 23 today. I will give her Prevnar 13 in one year. The remainder of her lab work in her cancer screening is up-to-date and being managed at [facility]. Therefore I'll be glad to see the patient annually and as needed.  06/24/14 Interested in taking an antidepressant medication. Patient reports depression. She reports anhedonia. She reports decreased energy and she denies suicidal ideation. She does have frequent severe panic attacks for which she has to take Xanax. She denies any symptoms of bipolar or mania. She is taking Prozac in the past and had side effects from the Prozac. She would like an antidepressant medication that may help give her more energy and may also help combat hot flashes Past Medical History  Diagnosis Date  . Anxiety   . Cancer   . Brain cancer     2009 glioblastoma (stage 4) Dr. Bayard Hugger  . Seizures   . Arthritis     hip and wrist   Past Surgical History  Procedure Laterality Date  . Abdominal hysterectomy    . Cholecystectomy    . Brain surgery x2    . Appendectomy     Current Outpatient Prescriptions on File Prior to Visit  Medication Sig Dispense Refill  .  ALPRAZolam (XANAX) 1 MG tablet Take 1 mg by mouth 3 (three) times daily as needed for anxiety.     Marland Kitchen aspirin EC 81 MG tablet Take by mouth.    . Calcium-Vitamin D 600-200 MG-UNIT per tablet Take by mouth.    . divalproex (DEPAKOTE) 500 MG DR tablet Take 1,500 mg by mouth at bedtime.     . Estradiol (DIVIGEL) 1 MG/GM GEL Place 1 packet onto the skin daily. (Patient not taking: Reported on 05/20/2014) 30 g 11  . estradiol (ESTRACE) 2 MG tablet Take 2 mg by mouth daily.    . ferrous sulfate 325 (65 FE) MG tablet Take by mouth.    . fexofenadine-pseudoephedrine (ALLEGRA-D 24) 180-240 MG  per 24 hr tablet Take 1 tablet by mouth daily.    . fluconazole (DIFLUCAN) 150 MG tablet Take 1 tablet (150 mg total) by mouth once. (Patient not taking: Reported on 05/20/2014) 1 tablet 0  . fluticasone (FLONASE) 50 MCG/ACT nasal spray Place 2 sprays into both nostrils daily.    Marland Kitchen HYDROcodone-acetaminophen (NORCO) 10-325 MG per tablet Take 1 tablet by mouth every 6 (six) hours as needed (pain).     . methadone (DOLOPHINE) 10 MG tablet Take 10 mg by mouth every 8 (eight) hours.    . Multiple Vitamins-Minerals (MULTIVITAMIN WITH MINERALS) tablet Take 1 tablet by mouth daily.    . Omega-3 Fatty Acids (FISH OIL) 1000 MG CAPS Take by mouth.    . piroxicam (FELDENE) 20 MG capsule Take 1 capsule (20 mg total) by mouth daily. 30 capsule 1  . zolpidem (AMBIEN) 5 MG tablet Take 5 mg by mouth at bedtime as needed for sleep.     No current facility-administered medications on file prior to visit.   No Known Allergies History   Social History  . Marital Status: Married    Spouse Name: N/A  . Number of Children: N/A  . Years of Education: N/A   Occupational History  . Not on file.   Social History Main Topics  . Smoking status: Current Every Day Smoker -- 1.00 packs/day for 23 years    Types: Cigarettes  . Smokeless tobacco: Never Used  . Alcohol Use: No  . Drug Use: No  . Sexual Activity: Yes    Birth Control/ Protection: Other-see comments   Other Topics Concern  . Not on file   Social History Narrative   Family History  Problem Relation Age of Onset  . Heart disease Father   . Hypotension Father   . Diabetes Father   . Hyperlipidemia Maternal Grandfather   . Hypertension Maternal Grandfather   . Heart disease Maternal Grandfather       Review of Systems  All other systems reviewed and are negative.      Objective:   Physical Exam  Constitutional: She appears well-developed and well-nourished. No distress.  HENT:  Head: Normocephalic and atraumatic.  Right Ear:  External ear normal.  Left Ear: External ear normal.  Nose: Mucosal edema and rhinorrhea present. Right sinus exhibits maxillary sinus tenderness and frontal sinus tenderness. Left sinus exhibits maxillary sinus tenderness and frontal sinus tenderness.  Mouth/Throat: Oropharynx is clear and moist. No oropharyngeal exudate.  Neck: Neck supple.  Cardiovascular: Normal rate, regular rhythm and normal heart sounds.   Pulmonary/Chest: Effort normal and breath sounds normal. No respiratory distress. She has no wheezes. She has no rales.  Abdominal: Soft. Bowel sounds are normal.  Lymphadenopathy:    She has no cervical adenopathy.  Skin: She is not diaphoretic.  Vitals reviewed.         Assessment & Plan:  GAD (generalized anxiety disorder) - Plan: venlafaxine XR (EFFEXOR-XR) 37.5 MG 24 hr capsule, DISCONTINUED: venlafaxine XR (EFFEXOR-XR) 37.5 MG 24 hr capsule  We will start the patient on Effexor XR 37.5 mg by mouth every morning. In one week increase to 75 mg by mouth daily. If the patient is tolerating that well in a month we will continue the dose we could even consider increasing to help decrease the frequency of panic attacks.

## 2014-07-30 ENCOUNTER — Emergency Department (HOSPITAL_COMMUNITY)
Admission: EM | Admit: 2014-07-30 | Discharge: 2014-07-30 | Disposition: A | Discharge status: Home / Self Care | Payer: Medicare Other | Attending: Emergency Medicine | Admitting: Emergency Medicine

## 2014-07-30 ENCOUNTER — Encounter (HOSPITAL_COMMUNITY): Payer: Self-pay | Admitting: Emergency Medicine

## 2014-07-30 DIAGNOSIS — G43909 Migraine, unspecified, not intractable, without status migrainosus: Secondary | ICD-10-CM | POA: Diagnosis not present

## 2014-07-30 DIAGNOSIS — Z7982 Long term (current) use of aspirin: Secondary | ICD-10-CM | POA: Diagnosis not present

## 2014-07-30 DIAGNOSIS — R51 Headache: Secondary | ICD-10-CM | POA: Diagnosis present

## 2014-07-30 DIAGNOSIS — Z79899 Other long term (current) drug therapy: Secondary | ICD-10-CM | POA: Diagnosis not present

## 2014-07-30 DIAGNOSIS — Z7951 Long term (current) use of inhaled steroids: Secondary | ICD-10-CM | POA: Insufficient documentation

## 2014-07-30 DIAGNOSIS — Z720 Tobacco use: Secondary | ICD-10-CM | POA: Diagnosis not present

## 2014-07-30 DIAGNOSIS — M1990 Unspecified osteoarthritis, unspecified site: Secondary | ICD-10-CM | POA: Diagnosis not present

## 2014-07-30 DIAGNOSIS — Z85841 Personal history of malignant neoplasm of brain: Secondary | ICD-10-CM | POA: Diagnosis not present

## 2014-07-30 DIAGNOSIS — F419 Anxiety disorder, unspecified: Secondary | ICD-10-CM | POA: Insufficient documentation

## 2014-07-30 DIAGNOSIS — G40909 Epilepsy, unspecified, not intractable, without status epilepticus: Secondary | ICD-10-CM | POA: Diagnosis not present

## 2014-07-30 DIAGNOSIS — G43009 Migraine without aura, not intractable, without status migrainosus: Secondary | ICD-10-CM

## 2014-07-30 MED ORDER — HYDROMORPHONE HCL 1 MG/ML IJ SOLN
1.0000 mg | Freq: Once | INTRAMUSCULAR | Status: AC
  Administered 2014-07-30: 1 mg via INTRAMUSCULAR
  Filled 2014-07-30: qty 1

## 2014-07-30 MED ORDER — KETOROLAC TROMETHAMINE 30 MG/ML IJ SOLN
60.0000 mg | Freq: Once | INTRAMUSCULAR | Status: AC
  Administered 2014-07-30: 60 mg via INTRAMUSCULAR
  Filled 2014-07-30: qty 2

## 2014-07-30 MED ORDER — ONDANSETRON 8 MG PO TBDP
8.0000 mg | ORAL_TABLET | Freq: Once | ORAL | Status: AC
  Administered 2014-07-30: 8 mg via ORAL
  Filled 2014-07-30: qty 1

## 2014-07-30 NOTE — ED Notes (Signed)
Pt c/o headache and states she took hydrocodone and methadone with no relief.

## 2014-07-30 NOTE — ED Provider Notes (Signed)
TIME SEEN: 2:40 AM  CHIEF COMPLAINT: Migraine headache  HPI: Pt is a 54 y.o. female with history of glioblastoma status post resection in 2008 and again in 2009 and status post chemotherapy who reports she has had a normal brain MRI February 2016 at [facility], history of chronic back pain who presents to the emergency department with complaints of a migraine headache. States she's had similar headaches multiple times in the past. States she has had to come to the hospital for treatment for her migraines before. States this started approximately 2 hours prior to arrival. History of hydrocodone and methadone at home without relief. Denies any head injury. No fever. Is not on anticoagulation. Denies numbness, tingling or focal weakness. Is also complaining of her chronic back pain. No urinary retention, new urinary incontinence. No bowel incontinence. No history of injury to the back recently. Denies it this was a thunderclap, sudden onset headache.  ROS: See HPI Constitutional: no fever  Eyes: no drainage  ENT: no runny nose   Cardiovascular:  no chest pain  Resp: no SOB  GI: no vomiting GU: no dysuria Integumentary: no rash  Allergy: no hives  Musculoskeletal: no leg swelling  Neurological: no slurred speech ROS otherwise negative  PAST MEDICAL HISTORY/PAST SURGICAL HISTORY:  Past Medical History  Diagnosis Date  . Anxiety   . Cancer   . Brain cancer     2009 glioblastoma (stage 4) Dr. Bayard Hugger  . Seizures   . Arthritis     hip and wrist    MEDICATIONS:  Prior to Admission medications   Medication Sig Start Date End Date Taking? Authorizing Provider  ALPRAZolam [name]) 1 MG tablet Take 1 mg by mouth 3 (three) times daily as needed for anxiety.     Historical Provider, MD  aspirin EC 81 MG tablet Take by mouth.    Historical Provider, MD  Calcium-Vitamin D 600-200 MG-UNIT per tablet Take by mouth.    Historical Provider, MD  divalproex (DEPAKOTE) 500 MG DR tablet Take 1,500 mg by  mouth at bedtime.     Historical Provider, MD  Estradiol (DIVIGEL) 1 MG/GM GEL Place 1 packet onto the skin daily. 05/16/14   [name], MD  estradiol (ESTRACE) 2 MG tablet Take 2 mg by mouth daily.    Historical Provider, MD  ferrous sulfate 325 (65 FE) MG tablet Take by mouth.    Historical Provider, MD  fluticasone (FLONASE) 50 MCG/ACT nasal spray Place 2 sprays into both nostrils daily.    Historical Provider, MD  HYDROcodone-acetaminophen (NORCO) 10-325 MG per tablet Take 1 tablet by mouth every 6 (six) hours as needed (pain).     Historical Provider, MD  loratadine-pseudoephedrine (CLARITIN-D 24-HOUR) 10-240 MG per 24 hr tablet Take 1 tablet by mouth daily.    Historical Provider, MD  methadone (DOLOPHINE) 10 MG tablet Take 10 mg by mouth every 8 (eight) hours.    Historical Provider, MD  Multiple Vitamins-Minerals (MULTIVITAMIN WITH MINERALS) tablet Take 1 tablet by mouth daily.    Historical Provider, MD  Omega-3 Fatty Acids (FISH OIL) 1000 MG CAPS Take by mouth.    Historical Provider, MD  venlafaxine XR (EFFEXOR-XR) 37.5 MG 24 hr capsule 1 pill poqam x 1 week and then increase to 2 pills a day thereafter 06/24/14   [name], MD  zolpidem (AMBIEN) 5 MG tablet Take 5 mg by mouth at bedtime as needed for sleep.    Historical Provider, MD    ALLERGIES:  No Known Allergies  SOCIAL HISTORY:  History  Substance Use Topics  . Smoking status: Current Every Day Smoker -- 1.00 packs/day for 23 years    Types: Cigarettes  . Smokeless tobacco: Never Used  . Alcohol Use: No    FAMILY HISTORY: Family History  Problem Relation Age of Onset  . Heart disease Father   . Hypotension Father   . Diabetes Father   . Hyperlipidemia Maternal Grandfather   . Hypertension Maternal Grandfather   . Heart disease Maternal Grandfather     EXAM: BP 108/45 mmHg  Pulse 72  Temp(Src) 98.1 F (36.7 C) (Oral)  Resp 20  Ht 5\' 4"  (1.626 m)  Wt 160 lb (72.576 kg)  BMI 27.45 kg/m2  SpO2  93% CONSTITUTIONAL: Alert and oriented and responds appropriately to questions. Well-appearing; well-nourished, appears uncomfortable but nontoxic HEAD: Normocephalic, well-healed surgical scar and indentation of the skull over the right temporal region EYES: Conjunctivae clear, PERRL, photophobia, extraocular movements intact ENT: normal nose; no rhinorrhea; moist mucous membranes; pharynx without lesions noted NECK: Supple, no meningismus, no LAD  CARD: RRR; S1 and S2 appreciated; no murmurs, no clicks, no rubs, no gallops RESP: Normal chest excursion without splinting or tachypnea; breath sounds clear and equal bilaterally; no wheezes, no rhonchi, no rales, no hypoxia or respiratory distress, speaking full sentences ABD/GI: Normal bowel sounds; non-distended; soft, non-tender, no rebound, no guarding, no peritoneal signs BACK:  The back appears normal and is non-tender to palpation, there is no CVA tenderness EXT: Normal ROM in all joints; non-tender to palpation; no edema; normal capillary refill; no cyanosis, no calf tenderness or swelling    SKIN: Normal color for age and race; warm NEURO: Moves all extremities equally, sensation to light touch intact diffusely, cranial nerves II through XII intact, no pronator drift PSYCH: The patient's mood and manner are appropriate. Grooming and personal hygiene are appropriate.  MEDICAL DECISION MAKING: Patient here with her typical migraine headache. Also complaining of chronic back pain. Neurologically intact. Report she has had glioblastoma in the past but has had recent negative MRI. No fevers, history of head injury. States she has had similar headaches in the past. States when she has had to come to the hospital before she gets an IM injection of the medication that normally works within 10 minutes. She is not sure of the name of this medication. Given she is chronically on narcotics will give her dose of Dilaudid and Zofran in the ED. I do not feel  she needs any emergent head imaging.  ED PROGRESS: Patient reports minimal improvement with Dilaudid. Will give dose of Toradol and reassess.   4:00 AM Pt pain free after Toradol. We'll discharge home. Discussed return precautions. She verbalized understanding and is comfortable with this plan.  [place], DO 07/30/14 [phone]

## 2014-07-30 NOTE — Discharge Instructions (Signed)

## 2014-08-01 ENCOUNTER — Ambulatory Visit (INDEPENDENT_AMBULATORY_CARE_PROVIDER_SITE_OTHER): Payer: Medicare Other | Admitting: Family Medicine

## 2014-08-01 ENCOUNTER — Encounter: Payer: Self-pay | Admitting: Family Medicine

## 2014-08-01 VITALS — BP 100/64 | HR 80 | Temp 98.0°F | Resp 18 | Ht 64.0 in | Wt 178.0 lb

## 2014-08-01 DIAGNOSIS — Z79899 Other long term (current) drug therapy: Secondary | ICD-10-CM | POA: Diagnosis not present

## 2014-08-01 DIAGNOSIS — R5382 Chronic fatigue, unspecified: Secondary | ICD-10-CM

## 2014-08-01 MED ORDER — VARENICLINE TARTRATE 0.5 MG X 11 & 1 MG X 42 PO MISC: ORAL | Status: DC

## 2014-08-01 NOTE — Progress Notes (Signed)
Subjective:    Patient ID: Jill Harrell, female    DOB: 09/26/1960, 54 y.o.   MRN: [id number]  HPI 05/06/14 Patient is a very pleasant 54 year old white female who is here today to establish care. She has a very complicated past medical history. 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 She underwent surgical resection on 2 separate occasions as well as chemotherapy. Patient is stage IV and incurable. However she has been in stable condition for more than 6 years.  Initially she was only given 6 months to live but that is more than 8 years ago. Due to the surgery, the patient does have a history of seizure disorder. She takes Depakote for seizure prevention. She has not had seizures in several years. She is on methadone and Norco for chronic pain. This is prescribed by her oncologist at [facility]. She also has generalized anxiety disorder for which she takes Xanax 3 times a day as well as Ambien to help her sleep. She has never taken an SSRI for prevention of her anxiety. At the present time she is not interested in starting one of these medication. Her biggest concern is a sinus infection. She reports pain in her frontal and maxillary sinuses that has been there since February. She reports postnasal drip and headaches and fever. She is taking Levaquin without relief. She is tried Flonase without relief. She is tried Allegra-D without relief.  Patient already has a colonoscopy scheduled at [facility] along with a Pap smear and mammogram. She does not need me to schedule these things. Because her cancer has been stable for so many years she is proceeding with regular cancer screening. She is overdue for a pneumonia vaccine. Her flu shot is up-to-date. Her lab work is checked every month at the cancer center.  At that time, my plan was:  Begin Augmentin 875 mg by mouth twice a day for 10 days. Also gave the patient prescription for Diflucan to be used if needed for yeast infection. If  this does not help, the patient would probably benefit from a steroid taper pack however I would want to run this by her physician at [facility] prior to giving the patient's steroids having just met her. Patient also received Pneumovax 23 today. I will give her Prevnar 13 in one year. The remainder of her lab work in her cancer screening is up-to-date and being managed at [facility]. Therefore I'll be glad to see the patient annually and as needed.  06/24/14 Interested in taking an antidepressant medication. Patient reports depression. She reports anhedonia. She reports decreased energy and she denies suicidal ideation. She does have frequent severe panic attacks for which she has to take Xanax. She denies any symptoms of bipolar or mania. She is taking Prozac in the past and had side effects from the Prozac. She would like an antidepressant medication that may help give her more energy and may also help combat hot flashes.  At that time, my plan was: We will start the patient on Effexor XR 37.5 mg by mouth every morning. In one week increase to 75 mg by mouth daily. If the patient is tolerating that well in a month we will continue the dose we could even consider increasing to help decrease the frequency of panic attacks.  08/02/14 Patient stopped effexor after 4 days due to intractable nausea.  The patient believes she may have fibromyalgia. She states the last several months she is developed pain and stiffness in  her neck and her shoulders and her hips and in her thighs. She denies any injuries to these areas. She also has chronic fatigue. Internet research has had low at her to a conclusion that she may have fibromyalgia. However she also admits that she does not sleep well at night. She will sleep only 2-3 hours at a time. She then finds that she sleeps all day long. She never feels rested. She has no energy. She can sleep all day and when she is awake she feels lightheaded and "drunk "or even "hung over. Of note she  does take her methadone in the morning. She also uses hydrocodone throughout the day for breakthrough pain in addition to Xanax. She is also on Depakote for seizure prophylaxis. I explained to the patient that some of her symptoms may be from overmedication and she says that it is possible. She agrees that it is also possible that the reason she is not sleeping well at night is because she is sleeping so much during the day and because of her lack of physical exercise throughout the day. She is also interested in using Chantix for smoking cessation.  Past Medical History  Diagnosis Date  . Anxiety   . Cancer   . Brain cancer     2009 glioblastoma (stage 4) Dr. Bayard Hugger  . Seizures   . Arthritis     hip and wrist   Past Surgical History  Procedure Laterality Date  . Abdominal hysterectomy    . Cholecystectomy    . Brain surgery x2    . Appendectomy     Current Outpatient Prescriptions on File Prior to Visit  Medication Sig Dispense Refill  . ALPRAZolam (XANAX) 1 MG tablet Take 1 mg by mouth 3 (three) times daily as needed for anxiety.     Marland Kitchen aspirin EC 81 MG tablet Take by mouth.    . Calcium-Vitamin D 600-200 MG-UNIT per tablet Take by mouth.    . divalproex (DEPAKOTE) 500 MG DR tablet Take 1,500 mg by mouth at bedtime.     . Estradiol (DIVIGEL) 1 MG/GM GEL Place 1 packet onto the skin daily. 30 g 11  . estradiol (ESTRACE) 2 MG tablet Take 2 mg by mouth daily.    . ferrous sulfate 325 (65 FE) MG tablet Take by mouth.    . fluticasone (FLONASE) 50 MCG/ACT nasal spray Place 2 sprays into both nostrils daily.    Marland Kitchen HYDROcodone-acetaminophen (NORCO) 10-325 MG per tablet Take 1 tablet by mouth every 6 (six) hours as needed (pain).     Marland Kitchen loratadine-pseudoephedrine (CLARITIN-D 24-HOUR) 10-240 MG per 24 hr tablet Take 1 tablet by mouth daily.    . methadone (DOLOPHINE) 10 MG tablet Take 10 mg by mouth every 8 (eight) hours.    . Multiple Vitamins-Minerals (MULTIVITAMIN WITH MINERALS)  tablet Take 1 tablet by mouth daily.    . Omega-3 Fatty Acids (FISH OIL) 1000 MG CAPS Take by mouth.    . venlafaxine XR (EFFEXOR-XR) 37.5 MG 24 hr capsule 1 pill poqam x 1 week and then increase to 2 pills a day thereafter 60 capsule 3  . zolpidem (AMBIEN) 5 MG tablet Take 5 mg by mouth at bedtime as needed for sleep.     No current facility-administered medications on file prior to visit.   No Known Allergies History   Social History  . Marital Status: Married    Spouse Name: N/A  . Number of Children: N/A  . Years of  Education: N/A   Occupational History  . Not on file.   Social History Main Topics  . Smoking status: Current Every Day Smoker -- 1.00 packs/day for 23 years    Types: Cigarettes  . Smokeless tobacco: Never Used  . Alcohol Use: No  . Drug Use: No  . Sexual Activity: Yes    Birth Control/ Protection: Other-see comments   Other Topics Concern  . Not on file   Social History Narrative      Review of Systems  All other systems reviewed and are negative.      Objective:   Physical Exam  Constitutional: She is oriented to person, place, and time. She appears well-developed and well-nourished.  Cardiovascular: Normal rate, regular rhythm and normal heart sounds.   Pulmonary/Chest: Effort normal and breath sounds normal.  Neurological: She is alert and oriented to person, place, and time. She has normal reflexes. She displays normal reflexes. No cranial nerve deficit. She exhibits normal muscle tone. Coordination normal.  Vitals reviewed.         Assessment & Plan:  Chronic fatigue  Polypharmacy  Fibromyalgia is certainly a possibility. However I believe most likely cause of her symptoms is fatigued secondary to polypharmacy and overmedication or medication side effect.  I believe that the patient was more alert during the day and more active during the day, she will sleep better at night. I then believe that some of the muscle tension she is  developed in her neck and shoulders and legs would improve after she was resting better and more consistently at night. Differential diagnosis includes hypersomnolence due to obstructive sleep apnea, narcolepsy, fibromyalgia, Lyme disease. I will give the patient Chantix to help with smoking cessation. I have suggested trying the patient on a stimulant such as Provigil in the morning to increase her alertness during the day such that she can increase her activity during the day and then see if this improves her sleeping patterns at night. I hesitate to give her even more sedative medications for insomnia or muscle relaxers given her medication list at the present time. Patient is willing to try this but she would like me to discuss it with her neurosurgeon Dr. Imagene Riches (sp?) at [facility] first.

## 2014-08-02 ENCOUNTER — Telehealth: Payer: Self-pay | Admitting: Family Medicine

## 2014-08-02 ENCOUNTER — Encounter: Payer: Self-pay | Admitting: Family Medicine

## 2014-08-02 NOTE — Telephone Encounter (Signed)
Have no clue what she is talking about.  Do you know what she is referring too

## 2014-08-02 NOTE — Telephone Encounter (Signed)
Not yet, we were very busy today.  I will try Monday.

## 2014-08-02 NOTE — Telephone Encounter (Signed)
[phone] PT states that she was seen in our office 6/2 and [name] was suppose to contact a Dr at [facility] in regards to one of her medications and she is wanting to know if he has done so.

## 2014-08-06 ENCOUNTER — Telehealth: Payer: Self-pay | Admitting: Family Medicine

## 2014-08-06 NOTE — Telephone Encounter (Signed)
[facility]

## 2014-08-06 NOTE — Telephone Encounter (Signed)
Notify patient we have called and left a message for her doctor and I am waiting to hear back.

## 2014-08-06 NOTE — Telephone Encounter (Signed)
Pt called back again wanting to know if you have contacted the Dr. At [facility] in reference to some medication. She did not tel me the name of it. I informed her that Dr. Dennard Schaumann will try to contact today and will hear something from [place] once he has contacted. Pt states she will call [facility] her self and have someone from there to contact us.  Received a call around 12:30pm from a nurse at [facility] stating that pt called and left message to have dr. To call us in reference to medication. I told the nurse that Dr. Dennard Schaumann will be calling to confirm when he is able d/t to seeing pt's. Nurse states she understands and will wait for the call.

## 2014-08-08 ENCOUNTER — Telehealth: Payer: Self-pay | Admitting: Family Medicine

## 2014-08-08 MED ORDER — METHYLPHENIDATE HCL 10 MG PO TABS: 10.0000 mg | ORAL_TABLET | ORAL | Status: DC

## 2014-08-08 NOTE — Telephone Encounter (Signed)
Pt would like to try medication and she states that she would sign a waiver to take the medication if necessary

## 2014-08-08 NOTE — Telephone Encounter (Signed)
RX printed, left up front and patient aware to pick up  

## 2014-08-08 NOTE — Telephone Encounter (Signed)
-----   Message from [name], MD sent at 08/08/2014  8:24 AM EDT ----- Can we call patient and tell her we have left message but I have not heard back.  Would she like to try methylphenidate 10 mg poqam for fatigue in the morning to see if the stimulant can help with her fatigue?

## 2014-08-08 NOTE — Telephone Encounter (Signed)
Please notify patient.

## 2014-08-08 NOTE — Telephone Encounter (Signed)
Cecila from [facility] call to say that the doctor agree that patient can take the Ritalin medication [phone]

## 2014-08-08 NOTE — Telephone Encounter (Signed)
Methylphenidate 10 mg poqam

## 2014-08-09 NOTE — Telephone Encounter (Signed)
Pt aware via vm 

## 2014-08-13 ENCOUNTER — Telehealth: Payer: Self-pay | Admitting: Family Medicine

## 2014-09-10 ENCOUNTER — Telehealth: Payer: Self-pay | Admitting: Family Medicine

## 2014-09-10 NOTE — Telephone Encounter (Signed)
?   OK to Refill  

## 2014-09-10 NOTE — Telephone Encounter (Signed)
[facility] with ritalin refill.

## 2014-09-10 NOTE — Telephone Encounter (Signed)
[phone] PT is needing a refill on methylphenidate (RITALIN) 10 MG tablet  PT has infection in her tooth and she doesn't have a dentist apt till 7/26 (and this is with a new dentist) amoxicillin [place]

## 2014-09-11 MED ORDER — AMOXICILLIN 500 MG PO TABS: 500.0000 mg | ORAL_TABLET | Freq: Two times a day (BID) | ORAL | Status: DC

## 2014-09-11 MED ORDER — METHYLPHENIDATE HCL 10 MG PO TABS: 10.0000 mg | ORAL_TABLET | ORAL | Status: DC

## 2014-09-11 NOTE — Telephone Encounter (Signed)
Amox sent to pharm as per [facility] and rx printed and will leave for him to sign in the am and pt aware to pick up tom.

## 2014-10-03 ENCOUNTER — Ambulatory Visit: Payer: Medicare Other | Admitting: Physician Assistant

## 2014-10-07 ENCOUNTER — Ambulatory Visit: Payer: Medicare Other | Admitting: Physician Assistant

## 2014-10-24 ENCOUNTER — Other Ambulatory Visit: Payer: Medicare Other

## 2014-10-24 DIAGNOSIS — R7989 Other specified abnormal findings of blood chemistry: Secondary | ICD-10-CM

## 2014-10-24 DIAGNOSIS — R945 Abnormal results of liver function studies: Principal | ICD-10-CM

## 2014-10-25 ENCOUNTER — Encounter: Payer: Self-pay | Admitting: Family Medicine

## 2014-10-25 LAB — COMPLETE METABOLIC PANEL WITH GFR
ALT: 12 U/L (ref 6–29)
AST: 16 U/L (ref 10–35)
Albumin: 3.5 g/dL — ABNORMAL LOW (ref 3.6–5.1)
Alkaline Phosphatase: 51 U/L (ref 33–130)
BUN: 19 mg/dL (ref 7–25)
CO2: 26 mmol/L (ref 20–31)
Calcium: 8.5 mg/dL — ABNORMAL LOW (ref 8.6–10.4)
Chloride: 107 mmol/L (ref 98–110)
Creat: 0.72 mg/dL (ref 0.50–1.05)
GFR, Est African American: >89 mL/min (ref >=60)
GFR, Est Non African American: >89 mL/min (ref >=60)
Glucose, Bld: 87 mg/dL (ref 70–99)
Potassium: 4.2 mmol/L (ref 3.5–5.3)
Sodium: 142 mmol/L (ref 135–146)
Total Bilirubin: 0.3 mg/dL (ref 0.2–1.2)
Total Protein: 6.2 g/dL (ref 6.1–8.1)

## 2014-11-07 ENCOUNTER — Telehealth: Payer: Self-pay | Admitting: Family Medicine

## 2014-11-07 NOTE — Telephone Encounter (Signed)
?   OK to Refill  

## 2014-11-07 NOTE — Telephone Encounter (Signed)
Patient is calling to ask for rx for her ritalin  Call her at [phone]

## 2014-11-07 NOTE — Telephone Encounter (Signed)
ok 

## 2014-11-08 MED ORDER — METHYLPHENIDATE HCL 10 MG PO TABS: 10.0000 mg | ORAL_TABLET | ORAL | Status: DC

## 2014-11-08 NOTE — Telephone Encounter (Signed)
RX printed, left up front and patient aware to pick up after 2 pm  

## 2014-11-26 ENCOUNTER — Ambulatory Visit: Payer: Medicare Other | Admitting: Family Medicine

## 2014-12-23 ENCOUNTER — Encounter: Payer: Self-pay | Admitting: Family Medicine

## 2014-12-23 ENCOUNTER — Ambulatory Visit (INDEPENDENT_AMBULATORY_CARE_PROVIDER_SITE_OTHER): Payer: Medicare Other | Admitting: Family Medicine

## 2014-12-23 VITALS — BP 120/68 | HR 76 | Temp 97.6°F | Resp 18 | Ht 64.0 in | Wt 166.0 lb

## 2014-12-23 DIAGNOSIS — S46311A Strain of muscle, fascia and tendon of triceps, right arm, initial encounter: Secondary | ICD-10-CM | POA: Diagnosis not present

## 2014-12-23 NOTE — Progress Notes (Signed)
Subjective:    Patient ID: Jill Harrell, female    DOB: 1960/03/17, 54 y.o.   MRN: [id number]  HPI  2 weeks ago, the patient developed a painful mass on her posterior lateral upper right arm just below the deltoid. The mass is approximately 3-4 cm in diameter. It feels like a large muscle bundle. She is unable to abduct her shoulder at all. She is unable to extend her elbow. She has severe pain with any range of motion in the right shoulder. It appears that she is either ruptured a tendon of the triceps.  I feel the location of the muscle mass is too inferior to be anything to do with rotator cuff. It is also too lateral to be part of the biceps. However it certainly appears to be a swollen mass of muscle Past Medical History  Diagnosis Date  . Anxiety   . Cancer ([place])   . 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 Bayard Hugger  . Seizures ([place])   . Arthritis     hip and wrist   Past Surgical History  Procedure Laterality Date  . Abdominal hysterectomy    . Cholecystectomy    . Brain surgery x2    . Appendectomy     Current Outpatient Prescriptions on File Prior to Visit  Medication Sig Dispense Refill  . ALPRAZolam (XANAX) 1 MG tablet Take 1 mg by mouth 3 (three) times daily as needed for anxiety.     Marland Kitchen aspirin EC 81 MG tablet Take by mouth.    . Calcium-Vitamin D 600-200 MG-UNIT per tablet Take by mouth.    . divalproex (DEPAKOTE) 500 MG DR tablet Take 1,500 mg by mouth at bedtime.     . Estradiol (DIVIGEL) 1 MG/GM GEL Place 1 packet onto the skin daily. 30 g 11  . estradiol (ESTRACE) 2 MG tablet Take 2 mg by mouth daily.    . ferrous sulfate 325 (65 FE) MG tablet Take by mouth.    . fluticasone (FLONASE) 50 MCG/ACT nasal spray Place 2 sprays into both nostrils daily.    Marland Kitchen HYDROcodone-acetaminophen (NORCO) 10-325 MG per tablet Take 1 tablet by mouth every 6 (six) hours as needed (pain).     Marland Kitchen loratadine-pseudoephedrine (CLARITIN-D 24-HOUR) 10-240 MG per 24 hr  tablet Take 1 tablet by mouth daily.    . methadone (DOLOPHINE) 10 MG tablet Take 10 mg by mouth every 8 (eight) hours.    . methylphenidate (RITALIN) 10 MG tablet Take 1 tablet (10 mg total) by mouth every morning. 30 tablet 0  . Multiple Vitamins-Minerals (MULTIVITAMIN WITH MINERALS) tablet Take 1 tablet by mouth daily.    . Omega-3 Fatty Acids (FISH OIL) 1000 MG CAPS Take by mouth.    . varenicline (CHANTIX STARTING MONTH PAK) 0.5 MG X 11 & 1 MG X 42 tablet Take one 0.5 mg tablet by mouth once daily for 3 days, then increase to one 0.5 mg tablet twice daily for 4 days, then increase to one 1 mg tablet twice daily. 53 tablet 0  . zolpidem (AMBIEN) 5 MG tablet Take 5 mg by mouth at bedtime as needed for sleep.     No current facility-administered medications on file prior to visit.   No Known Allergies Social History   Social History  . Marital Status: Married    Spouse Name: N/A  . Number of Children: N/A  . Years of Education: N/A   Occupational History  .  Not on file.   Social History Main Topics  . Smoking status: Current Every Day Smoker -- 1.00 packs/day for 23 years    Types: Cigarettes  . Smokeless tobacco: Never Used  . Alcohol Use: No  . Drug Use: No  . Sexual Activity: Yes    Birth Control/ Protection: Other-see comments   Other Topics Concern  . Not on file   Social History Narrative     Review of Systems  All other systems reviewed and are negative.      Objective:   Physical Exam  Cardiovascular: Normal rate and regular rhythm.   Pulmonary/Chest: Effort normal and breath sounds normal.  Musculoskeletal:       Right shoulder: She exhibits decreased range of motion, tenderness, swelling, deformity, pain and decreased strength.       Right upper arm: She exhibits tenderness and deformity.  Vitals reviewed.  3 x 4 cm muscle mass on the posterior lateral upper right humerus consistent with a ruptured triceps.       Assessment & Plan:  Triceps tendon  rupture, right, initial encounter - Plan: MR Humerus Right W Wo Contrast  Given the location, I am not 100% sure which muscle group has ruptured but I believe it to be the tricep or possibly a distal portion of the deltoid. I will obtain an MRI of the right humerus to evaluate further. Patient was placed in a sling for comfort until we are able to obtain MRI.

## 2014-12-25 ENCOUNTER — Telehealth: Payer: Self-pay | Admitting: *Deleted

## 2014-12-25 NOTE — Telephone Encounter (Signed)
Pt has appt scheduled for Nov 11 at 4:30pm at [facility], per pt insurance does not require precert. Forestine Na is putting pt on call wait list, left message to return my call to pt.

## 2014-12-26 NOTE — Telephone Encounter (Signed)
Pt called back and aware of appt 

## 2015-01-10 ENCOUNTER — Ambulatory Visit (HOSPITAL_COMMUNITY)
Admission: RE | Admit: 2015-01-10 | Discharge: 2015-01-10 | Disposition: A | Discharge status: Home / Self Care | Payer: Medicare Other | Source: Ambulatory Visit | Attending: Family Medicine | Admitting: Family Medicine

## 2015-01-10 DIAGNOSIS — M79621 Pain in right upper arm: Secondary | ICD-10-CM | POA: Insufficient documentation

## 2015-01-10 DIAGNOSIS — M25621 Stiffness of right elbow, not elsewhere classified: Secondary | ICD-10-CM | POA: Diagnosis not present

## 2015-01-10 DIAGNOSIS — M25421 Effusion, right elbow: Secondary | ICD-10-CM | POA: Diagnosis not present

## 2015-01-10 DIAGNOSIS — M87821 Other osteonecrosis, right humerus: Secondary | ICD-10-CM | POA: Insufficient documentation

## 2015-01-10 DIAGNOSIS — S46311A Strain of muscle, fascia and tendon of triceps, right arm, initial encounter: Secondary | ICD-10-CM

## 2015-01-10 MED ORDER — GADOBENATE DIMEGLUMINE 529 MG/ML IV SOLN
15.0000 mL | Freq: Once | INTRAVENOUS | Status: AC | PRN
  Administered 2015-01-10: 15 mL via INTRAVENOUS

## 2015-01-16 ENCOUNTER — Other Ambulatory Visit: Payer: Self-pay | Admitting: Family Medicine

## 2015-01-16 DIAGNOSIS — M7581 Other shoulder lesions, right shoulder: Principal | ICD-10-CM

## 2015-01-16 DIAGNOSIS — M87021 Idiopathic aseptic necrosis of right humerus: Secondary | ICD-10-CM

## 2015-01-16 DIAGNOSIS — M778 Other enthesopathies, not elsewhere classified: Secondary | ICD-10-CM

## 2015-01-22 ENCOUNTER — Telehealth: Payer: Self-pay | Admitting: Family Medicine

## 2015-01-22 NOTE — Telephone Encounter (Signed)
Pt is calling to request a referral to an allergist. She is having a lot of trouble with her sinuses, head and eyes and it does not seem to be resolving with OTC meds.  Please advise [phone]

## 2015-01-27 ENCOUNTER — Telehealth: Payer: Self-pay | Admitting: Family Medicine

## 2015-01-27 NOTE — Telephone Encounter (Signed)
?   With referral, I believe she needs to see an ENT vs Allergist

## 2015-01-27 NOTE — Telephone Encounter (Signed)
Pt aware of message

## 2015-01-27 NOTE — Telephone Encounter (Signed)
Tried to call pt and no answer and no vm 

## 2015-01-27 NOTE — Telephone Encounter (Signed)
What patch is she referring to?  There are none on her med list.  As long as it is not fentanyl, I would be fine with the others (i.e lidoderm, flector, etc).

## 2015-01-27 NOTE — Telephone Encounter (Signed)
Contacted pt to inform her needs ov and pt stated that she has no idea what I was speaking about and does not need an allergist., pt stated she was inquiring about her orthopedic appt coming up ijn Dec. Please see phone note dated 01/27/15

## 2015-01-27 NOTE — Telephone Encounter (Signed)
No, cannot combine fentanyl and dilaudid and methadone.

## 2015-01-27 NOTE — Telephone Encounter (Signed)
I'd be glad to see her because prednisone would likely help.

## 2015-01-27 NOTE — Telephone Encounter (Signed)
Pt is referring to Fentanyl because her Aunt had them and she tried it and was working and wanted to know if she could get some presribed. Please advise

## 2015-01-27 NOTE — Telephone Encounter (Signed)
Patient called and states that she isn't able to see the orthopedic doctor until 02/10/15 and she is in pain with her shoulder. She would like to know if you can call her in a patch for pain. Please advise.

## 2015-01-30 ENCOUNTER — Ambulatory Visit: Payer: Medicare Other | Admitting: Family Medicine

## 2015-02-14 ENCOUNTER — Telehealth: Payer: Self-pay | Admitting: Family Medicine

## 2015-02-14 MED ORDER — METHYLPHENIDATE HCL 10 MG PO TABS: 10.0000 mg | ORAL_TABLET | ORAL | Status: DC

## 2015-02-14 NOTE — Telephone Encounter (Signed)
?   OK to Refill  

## 2015-02-14 NOTE — Telephone Encounter (Signed)
ok 

## 2015-02-14 NOTE — Telephone Encounter (Signed)
Rx printed and given to husband while at [facility]

## 2015-02-14 NOTE — Telephone Encounter (Signed)
Patient is calling to get her ritalin rx, her husband has appt today and would like to know if it can be picked up today  [phone] (M)

## 2015-03-19 DIAGNOSIS — G4700 Insomnia, unspecified: Secondary | ICD-10-CM | POA: Diagnosis not present

## 2015-03-19 DIAGNOSIS — C711 Malignant neoplasm of frontal lobe: Secondary | ICD-10-CM | POA: Diagnosis not present

## 2015-03-19 DIAGNOSIS — F411 Generalized anxiety disorder: Secondary | ICD-10-CM | POA: Diagnosis not present

## 2015-03-19 DIAGNOSIS — G894 Chronic pain syndrome: Secondary | ICD-10-CM | POA: Diagnosis not present

## 2015-03-20 ENCOUNTER — Telehealth: Payer: Self-pay | Admitting: Family Medicine

## 2015-03-20 DIAGNOSIS — C719 Malignant neoplasm of brain, unspecified: Secondary | ICD-10-CM

## 2015-03-20 DIAGNOSIS — Z859 Personal history of malignant neoplasm, unspecified: Secondary | ICD-10-CM

## 2015-03-20 NOTE — Telephone Encounter (Signed)
Patient would like to know if you can refer her to a Hematologist/oncologist in [place] within the [employer] plan. She states she went yesterday to [facility] for her month follow up and decided that was too much. You can call her at [phone].

## 2015-03-21 NOTE — Telephone Encounter (Signed)
[facility]

## 2015-03-24 DIAGNOSIS — M1990 Unspecified osteoarthritis, unspecified site: Secondary | ICD-10-CM | POA: Insufficient documentation

## 2015-03-24 DIAGNOSIS — G43909 Migraine, unspecified, not intractable, without status migrainosus: Secondary | ICD-10-CM | POA: Diagnosis not present

## 2015-03-24 DIAGNOSIS — Z85841 Personal history of malignant neoplasm of brain: Secondary | ICD-10-CM | POA: Insufficient documentation

## 2015-03-24 DIAGNOSIS — F17210 Nicotine dependence, cigarettes, uncomplicated: Secondary | ICD-10-CM | POA: Insufficient documentation

## 2015-03-24 DIAGNOSIS — Z79899 Other long term (current) drug therapy: Secondary | ICD-10-CM | POA: Insufficient documentation

## 2015-03-24 DIAGNOSIS — Z7982 Long term (current) use of aspirin: Secondary | ICD-10-CM | POA: Diagnosis not present

## 2015-03-24 DIAGNOSIS — Z9889 Other specified postprocedural states: Secondary | ICD-10-CM | POA: Insufficient documentation

## 2015-03-24 DIAGNOSIS — Z7951 Long term (current) use of inhaled steroids: Secondary | ICD-10-CM | POA: Diagnosis not present

## 2015-03-24 DIAGNOSIS — Z793 Long term (current) use of hormonal contraceptives: Secondary | ICD-10-CM | POA: Diagnosis not present

## 2015-03-24 DIAGNOSIS — F419 Anxiety disorder, unspecified: Secondary | ICD-10-CM | POA: Insufficient documentation

## 2015-03-24 DIAGNOSIS — G8929 Other chronic pain: Secondary | ICD-10-CM | POA: Insufficient documentation

## 2015-03-24 DIAGNOSIS — G43009 Migraine without aura, not intractable, without status migrainosus: Secondary | ICD-10-CM | POA: Diagnosis not present

## 2015-03-24 DIAGNOSIS — R51 Headache: Secondary | ICD-10-CM | POA: Diagnosis not present

## 2015-03-25 ENCOUNTER — Encounter (HOSPITAL_COMMUNITY): Payer: Self-pay | Admitting: *Deleted

## 2015-03-25 ENCOUNTER — Ambulatory Visit: Payer: Medicare Other | Admitting: Family Medicine

## 2015-03-25 ENCOUNTER — Emergency Department (HOSPITAL_COMMUNITY)
Admission: EM | Admit: 2015-03-25 | Discharge: 2015-03-25 | Disposition: A | Discharge status: Home / Self Care | Payer: PPO | Attending: Emergency Medicine | Admitting: Emergency Medicine

## 2015-03-25 DIAGNOSIS — G43009 Migraine without aura, not intractable, without status migrainosus: Secondary | ICD-10-CM

## 2015-03-25 MED ORDER — SODIUM CHLORIDE 0.9 % IV BOLUS (SEPSIS)
1000.0000 mL | Freq: Once | INTRAVENOUS | Status: AC
  Administered 2015-03-25: 1000 mL via INTRAVENOUS

## 2015-03-25 MED ORDER — DEXAMETHASONE SODIUM PHOSPHATE 10 MG/ML IJ SOLN
10.0000 mg | Freq: Once | INTRAMUSCULAR | Status: AC
  Administered 2015-03-25: 10 mg via INTRAVENOUS
  Filled 2015-03-25: qty 1

## 2015-03-25 MED ORDER — DIPHENHYDRAMINE HCL 50 MG/ML IJ SOLN
25.0000 mg | Freq: Once | INTRAMUSCULAR | Status: AC
  Administered 2015-03-25: 25 mg via INTRAVENOUS
  Filled 2015-03-25: qty 1

## 2015-03-25 MED ORDER — METOCLOPRAMIDE HCL 5 MG/ML IJ SOLN
10.0000 mg | Freq: Once | INTRAMUSCULAR | Status: AC
  Administered 2015-03-25: 10 mg via INTRAVENOUS
  Filled 2015-03-25: qty 2

## 2015-03-25 NOTE — ED Provider Notes (Signed)
CSN: [id number]     Arrival date & time 03/24/15  2359 History  By signing my name below, I, Jill Harrell, attest that this documentation has been prepared under the direction and in the presence of Jill Porter, MD at [phone]. Electronically Signed: Doran Harrell, ED Scribe. 03/25/2015. 1:35 AM.   Chief Complaint  Patient presents with  . Migraine    The history is provided by the patient. No language interpreter was used.   HPI Comments: Jill Harrell is a 55 y.o. female with a h/o brain cancer and migraines who presents to the Emergency Department complaining of a constant throbbing HA that began 3 days ago, January 20. Pt localized her pain to the top and right front of her head. Pt reports photophobia, phonophobia, vomiting x 2. Pt states she usually gets HA every couple of months. Pt denies any numbness and paresthesia. This is like headaches she has had since she was young. She has a FH of migraines also.  Pt is a current smoker. Pt takes methadone and hydrocodone for chronic pain and they did not help tonight. No known allergies to medications.  PCP [name]  Past Medical History  Diagnosis Date  . Anxiety   . Cancer ([place])   . Brain cancer ([place])     2009 glioblastoma (stage 4) Dr. Bayard Hugger  . Seizures ([place])   . Arthritis     hip and wrist   Past Surgical History  Procedure Laterality Date  . Abdominal hysterectomy    . Cholecystectomy    . Brain surgery x2    . Appendectomy     Family History  Problem Relation Age of Onset  . Heart disease Father   . Hypotension Father   . Diabetes Father   . Hyperlipidemia Maternal Grandfather   . Hypertension Maternal Grandfather   . Heart disease Maternal Grandfather    Social History  Substance Use Topics  . Smoking status: Current Every Day Smoker -- 1.00 packs/day for 23 years    Types: Cigarettes  . Smokeless tobacco: Never Used  . Alcohol Use: No  smokes 1/2 ppd Lives with spouse  OB History    Gravida Para Term  Preterm AB TAB SAB Ectopic Multiple Living            0     Review of Systems  Constitutional: Negative for fever and chills.  Respiratory: Negative for cough and shortness of breath.   Cardiovascular: Negative for chest pain.  Gastrointestinal: Positive for vomiting. Negative for nausea.  Neurological: Positive for headaches. Negative for weakness and numbness.  Psychiatric/Behavioral: Negative for confusion.      Allergies  Contrast media  Home Medications   Prior to Admission medications   Medication Sig Start Date End Date Taking? Authorizing Provider  ALPRAZolam [name]) 1 MG tablet Take 1 mg by mouth 3 (three) times daily as needed for anxiety.     Historical Provider, MD  aspirin EC 81 MG tablet Take by mouth.    Historical Provider, MD  Calcium-Vitamin D 600-200 MG-UNIT per tablet Take by mouth.    Historical Provider, MD  divalproex (DEPAKOTE) 500 MG DR tablet Take 1,500 mg by mouth at bedtime.     Historical Provider, MD  Estradiol (DIVIGEL) 1 MG/GM GEL Place 1 packet onto the skin daily. 05/16/14   [name], MD  estradiol (ESTRACE) 2 MG tablet Take 2 mg by mouth daily.    Historical Provider, MD  ferrous sulfate 325 (65 FE)  MG tablet Take by mouth.    Historical Provider, MD  fluticasone (FLONASE) 50 MCG/ACT nasal spray Place 2 sprays into both nostrils daily.    Historical Provider, MD  HYDROcodone-acetaminophen (NORCO) 10-325 MG per tablet Take 1 tablet by mouth every 6 (six) hours as needed (pain).     Historical Provider, MD  loratadine-pseudoephedrine (CLARITIN-D 24-HOUR) 10-240 MG per 24 hr tablet Take 1 tablet by mouth daily.    Historical Provider, MD  methadone (DOLOPHINE) 10 MG tablet Take 10 mg by mouth every 8 (eight) hours.    Historical Provider, MD  methylphenidate (RITALIN) 10 MG tablet Take 1 tablet (10 mg total) by mouth every morning. 02/14/15   [name], MD  Multiple Vitamins-Minerals (MULTIVITAMIN WITH MINERALS) tablet Take 1 tablet by  mouth daily.    Historical Provider, MD  Omega-3 Fatty Acids (FISH OIL) 1000 MG CAPS Take by mouth.    Historical Provider, MD  varenicline (CHANTIX STARTING MONTH PAK) 0.5 MG X 11 & 1 MG X 42 tablet Take one 0.5 mg tablet by mouth once daily for 3 days, then increase to one 0.5 mg tablet twice daily for 4 days, then increase to one 1 mg tablet twice daily. 08/01/14   [name], MD  zolpidem (AMBIEN) 5 MG tablet Take 5 mg by mouth at bedtime as needed for sleep.    Historical Provider, MD   BP 105/52 mmHg  Pulse 78  Temp(Src) 97.8 F (36.6 C) (Oral)  Resp 18  Ht 5\' 5"  (1.651 m)  Wt 160 lb (72.576 kg)  BMI 26.63 kg/m2  SpO2 95%  Vital signs normal   Physical Exam  Constitutional: She is oriented to person, place, and time. She appears well-developed and well-nourished.  Non-toxic appearance. She does not appear ill. No distress.  HENT:  Right Ear: External ear normal.  Left Ear: External ear normal.  Nose: Nose normal. No mucosal edema or rhinorrhea.  Mouth/Throat: Oropharynx is clear and moist and mucous membranes are normal. No dental abscesses or uvula swelling.  Right craniotomy scar with hair loss in same area.   Eyes: Conjunctivae and EOM are normal. Pupils are equal, round, and reactive to light.  Pupils are miotic  Neck: Normal range of motion and full passive range of motion without pain. Neck supple.  Cardiovascular: Normal rate, regular rhythm and normal heart sounds.  Exam reveals no gallop and no friction rub.   No murmur heard. Pulmonary/Chest: Effort normal and breath sounds normal. No respiratory distress. She has no wheezes. She has no rhonchi. She has no rales. She exhibits no tenderness and no crepitus.  Abdominal: Soft. Normal appearance and bowel sounds are normal. She exhibits no distension. There is no tenderness. There is no rebound and no guarding.  Musculoskeletal: Normal range of motion. She exhibits no edema or tenderness.  Moves all extremities well.    Neurological: She is alert and oriented to person, place, and time. She has normal strength. No cranial nerve deficit.  Slow slurred speech  Skin: Skin is warm, dry and intact. No rash noted. No erythema. No pallor.  Psychiatric: She has a normal mood and affect. Her speech is normal and behavior is normal. Her mood appears not anxious.  Nursing note and vitals reviewed.   ED Course  Procedures   Medications  sodium chloride 0.9 % bolus 1,000 mL (0 mLs Intravenous Stopped 03/25/15 0212)  metoCLOPramide (REGLAN) injection 10 mg (10 mg Intravenous Given 03/25/15 0052)  diphenhydrAMINE (BENADRYL) injection  25 mg (25 mg Intravenous Given 03/25/15 0052)  dexamethasone (DECADRON) injection 10 mg (10 mg Intravenous Given 03/25/15 0052)   DIAGNOSTIC STUDIES: Oxygen Saturation is 95% on room air, normal by my interpretation.    COORDINATION OF CARE: 12:44 AM Will give fluids and migraine cocktail medication. Discussed treatment plan with pt at bedside and pt agreed to plan.  Patient was rechecked at discharge. She states her headache is much improved. She feels ready to be discharged. Her husband states she seems improved.     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  COMPARISON: April 18, 2014  TECHNIQUE/PROTOCOL: Standard adult brain protocol.  CONTRAST: 98mL MultiHance IV. This MRI was performed before and after IV administration of contrast material. IV contrast was administered to improve disease detection and further define anatomy.  *    Complications:  No immediate patient complications or events noted.  FINDINGS: Right frontal craniotomy and underlying surgical defect. There is abnormal T2 hyperintensity involving the right frontal lobe. To a lesser extent there is T2 hyperintensity in the left frontal lobe and when compared to the  prior exam this is unchanged. There is minimal thin peripheral intrinsic T1 hyperintensity along the posterior margins of the surgical cavity. No definite enhancement.  No acute infarction or hemorrhage. The extra-axial spaces, basal cisterns, ventricles and sulci are normal. Intracranial flow-voids appear normal. Mildly prominent optic nerve sheaths. The mastoid sinuses, and visualized paranasal sinuses are unremarkable.  IMPRESSION: Stable exam without evidence of disease progression.   Electronically Reviewed by:  Jerrol Banana, MD Electronically Reviewed on:  02/27/2015 9:59 AM  I have reviewed the images and concur with the above findings.  Electronically Signed by:  Romelle Starcher, MD Electronically Signed on:  02/27/2015 2:33 PM     MDM    patient has a history of a drain tumor that is stable according to her last MRI 3 weeks ago at [facility]. She has a long history of migraine headaches, she presents tonight with a typical migraine headache. Her symptoms improved with a migraine cocktail. She had no worrisome symptoms to suggest something else was causing her headache tonight.    Final diagnoses:  Migraine without aura and without status migrainosus, not intractable    Plan discharge  Jill Porter, MD, FACEP   I personally performed the services described in this documentation, which was scribed in my presence. The recorded information has been reviewed and considered.  Jill Porter, MD, Barbette Or, MD 03/25/15 [phone]

## 2015-03-25 NOTE — Telephone Encounter (Signed)
Call pt husband if can not contact pt

## 2015-03-25 NOTE — ED Notes (Signed)
Pt c/o migraine x 3 days; pt has stage 4 brain cancer and states her regular meds are not helping with the pain like they usually do

## 2015-03-25 NOTE — Telephone Encounter (Signed)
Pt called back and states she is needing to go somewhere local for her oncology

## 2015-03-25 NOTE — Discharge Instructions (Signed)
Go home and rest. Recheck if you are getting worse instead of better.    Recurrent Migraine Headache A migraine headache is very bad, throbbing pain on one or both sides of your head. Recurrent migraines keep coming back. Talk to your doctor about what things may bring on (trigger) your migraine headaches. HOME CARE  Only take medicines as told by your doctor.  Lie down in a dark, quiet room when you have a migraine.  Keep a journal to find out if certain things bring on migraine headaches. For example, write down:  What you eat and drink.  How much sleep you get.  Any change to your diet or medicines.  Lessen how much alcohol you drink.  Quit smoking if you smoke.  Get enough sleep.  Lessen any stress in your life.  Keep lights dim if bright lights bother you or make your migraines worse. GET HELP IF:  Medicine does not help your migraines.  Your pain keeps coming back.  You have a fever. GET HELP RIGHT AWAY IF:   Your migraine becomes really bad.  You have a stiff neck.  You have trouble seeing.  Your muscles are weak, or you lose muscle control.  You lose your balance or have trouble walking.  You feel like you will pass out (faint), or you pass out.  You have really bad symptoms that are different than your first symptoms. MAKE SURE YOU:   Understand these instructions.  Will watch your condition.  Will get help right away if you are not doing well or get worse.   This information is not intended to replace advice given to you by your health care provider. Make sure you discuss any questions you have with your health care provider.   Document Released: 11/25/2007 Document Revised: 02/20/2013 Document Reviewed: 10/23/2012 Elsevier Interactive Patient Education [employer].

## 2015-04-01 ENCOUNTER — Other Ambulatory Visit (HOSPITAL_COMMUNITY): Payer: Self-pay | Admitting: Oncology

## 2015-04-01 NOTE — Telephone Encounter (Signed)
Referral placed.

## 2015-04-10 ENCOUNTER — Ambulatory Visit (INDEPENDENT_AMBULATORY_CARE_PROVIDER_SITE_OTHER): Payer: PPO | Admitting: Physician Assistant

## 2015-04-10 ENCOUNTER — Encounter: Payer: Self-pay | Admitting: Physician Assistant

## 2015-04-10 VITALS — BP 118/64 | HR 72 | Temp 97.8°F | Resp 18 | Wt 168.0 lb

## 2015-04-10 DIAGNOSIS — R4182 Altered mental status, unspecified: Secondary | ICD-10-CM | POA: Diagnosis not present

## 2015-04-10 DIAGNOSIS — R569 Unspecified convulsions: Secondary | ICD-10-CM | POA: Insufficient documentation

## 2015-04-10 DIAGNOSIS — C719 Malignant neoplasm of brain, unspecified: Secondary | ICD-10-CM

## 2015-04-10 LAB — CBC WITH DIFFERENTIAL/PLATELET
Basophils Absolute: 0 10*3/uL (ref 0.0–0.1)
Basophils Relative: 0 % (ref 0–1)
Eosinophils Absolute: 0.1 10*3/uL (ref 0.0–0.7)
Eosinophils Relative: 2 % (ref 0–5)
HCT: 40.2 % (ref 36.0–46.0)
Hemoglobin: 13.4 g/dL (ref 12.0–15.0)
Lymphocytes Relative: 49 % — ABNORMAL HIGH (ref 12–46)
Lymphs Abs: 3.4 10*3/uL (ref 0.7–4.0)
MCH: 29.9 pg (ref 26.0–34.0)
MCHC: 33.3 g/dL (ref 30.0–36.0)
MCV: 89.7 fL (ref 78.0–100.0)
MPV: 9.9 fL (ref 8.6–12.4)
Monocytes Absolute: 0.6 10*3/uL (ref 0.1–1.0)
Monocytes Relative: 8 % (ref 3–12)
Neutro Abs: 2.8 10*3/uL (ref 1.7–7.7)
Neutrophils Relative %: 41 % — ABNORMAL LOW (ref 43–77)
Platelets: 318 10*3/uL (ref 150–400)
RBC: 4.48 MIL/uL (ref 3.87–5.11)
RDW: 13.8 % (ref 11.5–15.5)
WBC: 6.9 10*3/uL (ref 4.0–10.5)

## 2015-04-10 LAB — URINALYSIS, ROUTINE W REFLEX MICROSCOPIC
Bilirubin Urine: NEGATIVE
Glucose, UA: NEGATIVE
Hgb urine dipstick: NEGATIVE
Ketones, ur: NEGATIVE
Leukocytes, UA: NEGATIVE
Nitrite: NEGATIVE
Protein, ur: NEGATIVE
Specific Gravity, Urine: 1.03 (ref 1.001–1.035)
pH: 5.5 (ref 5.0–8.0)

## 2015-04-10 NOTE — Progress Notes (Addendum)
Patient ID: Jill Harrell MRN: [id number], DOB: [birth year], 55 y.o. Date of Encounter: @DATE @  Chief Complaint:  Chief Complaint  Patient presents with  . x 4 days feels like in slow motion    has stage IV brain tumor, called spec told to see PCP for eval and have depakote level done, check for UTI  . Medication Refill    ritalin    HPI: 55 y.o. year old female  presents with above.   States that she has brain cancer. States that her doctors are at [facility] and her oncologist is in [place].  States that she did call those doctors offices about current symptoms but those doctors are in surgery currently today-- recommended she come here for some lab work and then they are going to call her tonight when they get out of surgery. They wanted her to come here and have urinalysis a Depakote level and check her blood pressure etc. to rule out other causes for her current symptoms.  She states that she has been noticing the following symptoms for the past few days: States that on Sunday night she started feeling pain in her right front for head. Says when she felt of the area with her hand, she feels knots at the surface that she has not felt there before. Says that these symptoms have also started since then. Says that one morning she tried to pick up her orange juice and spilled it. Says that she has been standing up to do something and forget why she stood up. Says that people have asked her questions and she hears something different than what they asked and so she answers them inappropriately. She says "I think I know what I am doing but my brain does not know what my body is doing" Says that she has been having problems with her grip holding to a cup or a mug so she has been doing that using 2 hands for the last few days. Says that even with using 2 hands on her coffee mug this morning the coffee mug hit her chin instead of her mouth and she spilled hot coffee on her chest and her  gown. Says that she had similar symptoms in 2008 when her cancer was diagnosed and she is very scared.  I reviewed her medication list which includes methadone. She states that she has been on that same dose of methadone for 8 years and that this has not changed and this is not contributing to her current symptoms.   Past Medical History  Diagnosis Date  . Anxiety   . Cancer (HCC)   . 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 Bayard Hugger  . Seizures ([place])   . Arthritis     hip and wrist     Home Meds: Outpatient Prescriptions Prior to Visit  Medication Sig Dispense Refill  . ALPRAZolam (XANAX) 1 MG tablet Take 1 mg by mouth 3 (three) times daily as needed for anxiety.     Marland Kitchen aspirin EC 81 MG tablet Take by mouth.    . Calcium-Vitamin D 600-200 MG-UNIT per tablet Take by mouth.    . divalproex (DEPAKOTE) 500 MG DR tablet Take 1,500 mg by mouth at bedtime.     . ferrous sulfate 325 (65 FE) MG tablet Take by mouth.    Marland Kitchen HYDROcodone-acetaminophen (NORCO) 10-325 MG per tablet Take 1 tablet by mouth every 6 (six) hours as needed (pain).     Marland Kitchen  methadone (DOLOPHINE) 10 MG tablet Take 10 mg by mouth every 8 (eight) hours.    . methylphenidate (RITALIN) 10 MG tablet Take 1 tablet (10 mg total) by mouth every morning. 30 tablet 0  . Multiple Vitamins-Minerals (MULTIVITAMIN WITH MINERALS) tablet Take 1 tablet by mouth daily.    . Omega-3 Fatty Acids (FISH OIL) 1000 MG CAPS Take by mouth.    . zolpidem (AMBIEN) 5 MG tablet Take 5 mg by mouth at bedtime as needed for sleep.    . Estradiol (DIVIGEL) 1 MG/GM GEL Place 1 packet onto the skin daily. (Patient not taking: Reported on 04/10/2015) 30 g 11  . estradiol (ESTRACE) 2 MG tablet Take 2 mg by mouth daily. Reported on 04/10/2015    . fluticasone (FLONASE) 50 MCG/ACT nasal spray Place 2 sprays into both nostrils daily. Reported on 04/10/2015    . loratadine-pseudoephedrine (CLARITIN-D 24-HOUR) 10-240 MG per 24 hr tablet Take 1 tablet by  mouth daily. Reported on 04/10/2015    . varenicline (CHANTIX STARTING MONTH PAK) 0.5 MG X 11 & 1 MG X 42 tablet Take one 0.5 mg tablet by mouth once daily for 3 days, then increase to one 0.5 mg tablet twice daily for 4 days, then increase to one 1 mg tablet twice daily. (Patient not taking: Reported on 04/10/2015) 53 tablet 0   No facility-administered medications prior to visit.    Allergies:  Allergies  Allergen Reactions  . Contrast Media [Iodinated Diagnostic Agents] Nausea And Vomiting    Social History   Social History  . Marital Status: Married    Spouse Name: N/A  . Number of Children: N/A  . Years of Education: N/A   Occupational History  . Not on file.   Social History Main Topics  . Smoking status: Current Every Day Smoker -- 1.00 packs/day for 23 years    Types: Cigarettes  . Smokeless tobacco: Never Used  . Alcohol Use: No  . Drug Use: No  . Sexual Activity: Yes    Birth Control/ Protection: Other-see comments   Other Topics Concern  . Not on file   Social History Narrative    Family History  Problem Relation Age of Onset  . Heart disease Father   . Hypotension Father   . Diabetes Father   . Hyperlipidemia Maternal Grandfather   . Hypertension Maternal Grandfather   . Heart disease Maternal Grandfather      Review of Systems:  See HPI for pertinent ROS. All other ROS negative.    Physical Exam: Blood pressure 118/64, pulse 72, temperature 97.8 F (36.6 C), temperature source Oral, resp. rate 18, weight 168 lb (76.204 kg)., Body mass index is 27.96 kg/(m^2). General: WNWD WF. Wearing scarf on her head. Appears in no acute distress. Neck: Supple. No thyromegaly. No lymphadenopathy. Lungs: Clear bilaterally to auscultation without wheezes, rales, or rhonchi. Breathing is unlabored. Heart: RRR with S1 S2. No murmurs, rubs, or gallops. Musculoskeletal:  Strength and tone normal for age. Extremities/Skin: Warm and dry. Neuro: Alert and oriented X 3.  Moves all extremities spontaneously. Gait is normal. CNII-XII grossly in tact. Psych:  Responds to questions appropriately with a normal affect.     ASSESSMENT AND PLAN:  55 y.o. year old female with  1. Altered mental status, unspecified altered mental status type Symptoms very concerning for being secondary to her cancer. Will check the following labs and fax all results to [facility] --Phone: [phone]---Fax---Not written clearly---will call above # to confirm fax #--looks  like [phone] (or [phone]) - Urinalysis, Routine w reflex microscopic (not at [facility]) - CBC with Differential/Platelet - COMPLETE METABOLIC PANEL WITH GFR - TSH - Urinalysis, Routine w reflex microscopic (not at [facility]) - Valproic acid level  2. Malignant neoplasm of brain, unspecified location ([place])  3. Seizures ([place]) - Valproic acid level  She states that her Oncologist is going to call her later today, when they get out of surgery.  Pt aware to call 911 to go to ER via EMS if symptoms worsen.  As well, I will f/u with sending results to her Oncologist.   Signed, Karis Juba, [place], [facility] 04/10/2015 3:20 PM

## 2015-04-11 LAB — COMPLETE METABOLIC PANEL WITH GFR
ALT: 8 U/L (ref 6–29)
AST: 11 U/L (ref 10–35)
Albumin: 3.7 g/dL (ref 3.6–5.1)
Alkaline Phosphatase: 52 U/L (ref 33–130)
BUN: 26 mg/dL — ABNORMAL HIGH (ref 7–25)
CO2: 24 mmol/L (ref 20–31)
Calcium: 9.3 mg/dL (ref 8.6–10.4)
Chloride: 101 mmol/L (ref 98–110)
Creat: 0.6 mg/dL (ref 0.50–1.05)
GFR, Est African American: >89 mL/min (ref >=60)
GFR, Est Non African American: >89 mL/min (ref >=60)
Glucose, Bld: 76 mg/dL (ref 70–99)
Potassium: 4.6 mmol/L (ref 3.5–5.3)
Sodium: 135 mmol/L (ref 135–146)
Total Bilirubin: 0.2 mg/dL (ref 0.2–1.2)
Total Protein: 6.8 g/dL (ref 6.1–8.1)

## 2015-04-11 LAB — TSH: TSH: 2.34 mIU/L

## 2015-04-11 LAB — VALPROIC ACID LEVEL: Valproic Acid Lvl: 61.5 ug/mL (ref 50.0–100.0)

## 2015-04-14 ENCOUNTER — Ambulatory Visit (HOSPITAL_COMMUNITY): Payer: PPO | Admitting: Hematology & Oncology

## 2015-04-16 DIAGNOSIS — R110 Nausea: Secondary | ICD-10-CM | POA: Diagnosis not present

## 2015-04-16 DIAGNOSIS — G4700 Insomnia, unspecified: Secondary | ICD-10-CM | POA: Diagnosis not present

## 2015-04-16 DIAGNOSIS — Z23 Encounter for immunization: Secondary | ICD-10-CM | POA: Diagnosis not present

## 2015-04-16 DIAGNOSIS — C711 Malignant neoplasm of frontal lobe: Secondary | ICD-10-CM | POA: Diagnosis not present

## 2015-04-16 DIAGNOSIS — F411 Generalized anxiety disorder: Secondary | ICD-10-CM | POA: Diagnosis not present

## 2015-04-16 DIAGNOSIS — G894 Chronic pain syndrome: Secondary | ICD-10-CM | POA: Diagnosis not present

## 2015-04-17 ENCOUNTER — Other Ambulatory Visit: Payer: Self-pay | Admitting: Family Medicine

## 2015-04-17 NOTE — Telephone Encounter (Signed)
Okay 

## 2015-04-17 NOTE — Telephone Encounter (Signed)
Are we going to refill this??  LRF 02/14/15 #30

## 2015-04-17 NOTE — Telephone Encounter (Signed)
Patient requesting a prescription on hermethylphenidate (RITALIN) 10 MG tablet  she would like to pick this up tomorrow.    Her CB# is  [phone]

## 2015-04-18 MED ORDER — METHYLPHENIDATE HCL 10 MG PO TABS: 10.0000 mg | ORAL_TABLET | ORAL | Status: DC

## 2015-04-18 NOTE — Telephone Encounter (Signed)
Rx printed, left pt message ready for pick up

## 2015-04-21 ENCOUNTER — Encounter (HOSPITAL_COMMUNITY): Payer: PPO | Attending: Hematology & Oncology | Admitting: Hematology & Oncology

## 2015-04-21 ENCOUNTER — Encounter (HOSPITAL_COMMUNITY): Payer: Self-pay | Admitting: Hematology & Oncology

## 2015-04-21 VITALS — BP 118/45 | HR 76 | Temp 98.2°F | Resp 18 | Wt 170.5 lb

## 2015-04-21 DIAGNOSIS — R569 Unspecified convulsions: Secondary | ICD-10-CM

## 2015-04-21 DIAGNOSIS — G43909 Migraine, unspecified, not intractable, without status migrainosus: Secondary | ICD-10-CM | POA: Insufficient documentation

## 2015-04-21 DIAGNOSIS — Z720 Tobacco use: Secondary | ICD-10-CM | POA: Diagnosis not present

## 2015-04-21 DIAGNOSIS — G43709 Chronic migraine without aura, not intractable, without status migrainosus: Secondary | ICD-10-CM

## 2015-04-21 DIAGNOSIS — C719 Malignant neoplasm of brain, unspecified: Secondary | ICD-10-CM | POA: Insufficient documentation

## 2015-04-21 MED ORDER — METHADONE HCL 10 MG PO TABS: 10.0000 mg | ORAL_TABLET | Freq: Three times a day (TID) | ORAL | Status: DC

## 2015-04-21 MED ORDER — DIVALPROEX SODIUM 500 MG PO DR TAB: 1500.0000 mg | DELAYED_RELEASE_TABLET | Freq: Every day | ORAL | Status: DC

## 2015-04-21 MED ORDER — HYDROCODONE-ACETAMINOPHEN 10-325 MG PO TABS: 1.0000 | ORAL_TABLET | Freq: Four times a day (QID) | ORAL | Status: DC | PRN

## 2015-04-21 MED ORDER — ALPRAZOLAM 1 MG PO TABS: 1.0000 mg | ORAL_TABLET | Freq: Three times a day (TID) | ORAL | Status: DC | PRN

## 2015-04-21 NOTE — Progress Notes (Signed)
[place] at [place] NOTE  Patient Care Team: [name], MD as PCP - General (Family Medicine)  CHIEF COMPLAINTS/PURPOSE OF CONSULTATION:  07/08/06: Craniotomy and resection performed by Dr. Dyann Kief. Patient opted for alternative therapies only.  08/07/07: New patient evaluation at [facility]. Recently moved from [place] to [place].  08/23/07: Status post Brain LAB guided right frontal craniotomy with resection of tumor. 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  08/30/07: Patient presents on postoperative day seven following right frontal craniotomy for recurrent disease. Patient admitted for emergency psychiatric evaluation secondary to pre-existing history, steroids, and tumor location; please refer to admission notes.  09/14/07: Patient has not initiated treatment as of this note. Patient initiates radiation therapy with concurrent low dose daily Temozolomide.  02/19/08: Status post five-day Temozolomide since October 2009 with a two-week break due to thrombocytopenia.  07/18/08: Patient is status post six cycles of five-day Temozolomide and two cycles of metronomic Temozolomide and will now come off treatment due to toxicities.    HISTORY OF PRESENTING ILLNESS:  Jill Harrell 55 y.o. female is here because of glioblastoma originally diagnosed in 2008.  Jill Harrell is accompanied by her husband today.  Original symptoms that initiated her diagnosis included memory loss, dilated pupils, headaches, forgetting names and issues focusing.  She came from [place] when she was originally diagnosed with a glioblastoma in 2008. She had her first brain surgery in [place] with chemotherapy, Temodar, and radiation treatment. She was then referred to [facility] for follow-up. She was never told she was cured because there is an "area very deep in her brain that they cannot get to." She follows up at  [facility] every third week of the month where they fill several medications regularly. She prefers to have an oncologist locally to fill her prescriptions, however she would like to continue having her scans done at [facility].  She uses one 5 mg ambien nightly. She takes her depakote before bed.  She has never had a colonoscopy. She gets regular mammograms here at [facility], her next is in March. She has received a flu shot this year. She has never received a pneumonia shot.  She would like to be tested for Hepatitis C.  The patient experienced discomfort while laying back on the exam table due to the bright overhead lights and light coming in from the window, stating "I can't handle bright lights". She carries sunglasses with her.   She is here today to discuss ongoing follow up for her glioblastoma originally diagnosed in 2008. She denies vision changes, hearing changes, ataxia, seizures, numbness, tingling, nausea, vomiting, changes in bowel or bladder habits, chest pain, cough, shortness of breath, and lower extremity pain/swelling. MEDICAL HISTORY:  Past Medical History  Diagnosis Date  . Anxiety   . Cancer ([place])   . Brain cancer ([place])     2009 glioblastoma (stage 4) Dr. Bayard Hugger  . Seizures ([place])   . Arthritis     hip and wrist    SURGICAL HISTORY: Past Surgical History  Procedure Laterality Date  . Abdominal hysterectomy    . Cholecystectomy    . Brain surgery x2    . Appendectomy      SOCIAL HISTORY: Social History   Social History  . Marital Status: Married    Spouse Name: N/A  . Number of Children: N/A  . Years of Education: N/A   Occupational History  . Not on  file.   Social History Main Topics  . Smoking status: Current Every Day Smoker -- 1.00 packs/day for 23 years    Types: Cigarettes  . Smokeless tobacco: Never Used  . Alcohol Use: No  . Drug Use: No  . Sexual Activity: Yes    Birth Control/ Protection: Other-see comments   Other Topics Concern  .  Not on file   Social History Narrative  Married 4 years in November 0 children Born in [place], [place]. Her biological family is from here. 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 Currently smokes, she has been cutting back. Works with [employer].   FAMILY HISTORY: Family History  Problem Relation Age of Onset  . Heart disease Father   . Hypotension Father   . Diabetes Father   . Hyperlipidemia Maternal Grandfather   . Hypertension Maternal Grandfather   . Heart disease Maternal Grandfather    indicated that her mother is alive. She indicated that her father is alive. She indicated that her maternal grandmother is deceased. She indicated that her maternal grandfather is deceased.   She is not close to her biological parents. All of her natural grandparents are deceased. Raised by her grandparents. Her aunts are still living. She is close to her biological maternal aunts. She has no natural siblings. 3 half brothers and 1 half sister, all living.  ALLERGIES:  is allergic to contrast media.  MEDICATIONS:  Current Outpatient Prescriptions  Medication Sig Dispense Refill  . ALPRAZolam (XANAX) 1 MG tablet Take 1 mg by mouth 3 (three) times daily as needed for anxiety.     . Calcium-Vitamin D 600-200 MG-UNIT per tablet Take by mouth.    . divalproex (DEPAKOTE) 500 MG DR tablet Take 1,500 mg by mouth at bedtime.     Marland Kitchen HYDROcodone-acetaminophen (NORCO) 10-325 MG per tablet Take 1 tablet by mouth every 6 (six) hours as needed (pain).     . methadone (DOLOPHINE) 10 MG tablet Take 10 mg by mouth every 8 (eight) hours.    . methylphenidate (RITALIN) 10 MG tablet Take 1 tablet (10 mg total) by mouth every morning. 30 tablet 0  . Multiple Vitamins-Minerals (MULTIVITAMIN WITH MINERALS) tablet Take 1 tablet by mouth daily.    . ondansetron (ZOFRAN) 8 MG tablet TAKE 1 TABLET BY MOUTH EVERY 8 HOURS AS NEEDED FOR NAUSEA AND VOMITING  1  . varenicline  (CHANTIX STARTING MONTH PAK) 0.5 MG X 11 & 1 MG X 42 tablet Take one 0.5 mg tablet by mouth once daily for 3 days, then increase to one 0.5 mg tablet twice daily for 4 days, then increase to one 1 mg tablet twice daily. 53 tablet 0  . zolpidem (AMBIEN) 5 MG tablet Take 5 mg by mouth at bedtime as needed for sleep.    . Omega-3 Fatty Acids (FISH OIL) 1000 MG CAPS Take by mouth.     No current facility-administered medications for this visit.    Review of Systems  Constitutional: Negative.   HENT: Negative.   Eyes: Negative.   Respiratory: Negative.   Cardiovascular: Negative.   Gastrointestinal: Negative.   Genitourinary: Negative.   Musculoskeletal: Negative.   Skin: Negative.   Neurological: Negative.        Sensitive to bright light.  Endo/Heme/Allergies: Negative.   Psychiatric/Behavioral: Positive for memory loss.  All other systems reviewed and are negative.  14 point ROS was done and is otherwise as detailed above or  in HPI   PHYSICAL EXAMINATION: ECOG PERFORMANCE STATUS: 1 - Symptomatic but completely ambulatory  Filed Vitals:   04/21/15 1520  BP: 118/45  Pulse: 76  Temp: 98.2 F (36.8 C)  Resp: 18   Filed Weights   04/21/15 1531  Weight: 170 lb 8 oz (77.338 kg)    Physical Exam  Constitutional: She is oriented to person, place, and time and well-developed, well-nourished, and in no distress.  HENT:  Head: Normocephalic and atraumatic.  Nose: Nose normal.  Mouth/Throat: Oropharynx is clear and moist. No oropharyngeal exudate.  Large craniotomy scar on the anterior skull with a little bit of a deficit there. 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  Eyes: Conjunctivae and EOM are normal. Pupils are equal, round, and reactive to light. Right eye exhibits no discharge. Left eye exhibits no discharge. No scleral icterus.  Neck: Normal range of motion. Neck supple. No tracheal deviation present. No thyromegaly present.  Cardiovascular: Normal rate, regular rhythm and normal heart  sounds.  Exam reveals no gallop and no friction rub.   No murmur heard. Pulmonary/Chest: Effort normal and breath sounds normal. She has no wheezes. She has no rales.  Abdominal: Soft. Bowel sounds are normal. She exhibits no distension and no mass. There is no tenderness. There is no rebound and no guarding.  Musculoskeletal: Normal range of motion. She exhibits no edema.  Lymphadenopathy:    She has no cervical adenopathy.  Neurological: She is alert and oriented to person, place, and time. She has normal reflexes. No cranial nerve deficit. Gait normal. Coordination normal.  Skin: Skin is warm and dry. No rash noted.  Psychiatric: Mood, memory, affect and judgment normal.  Nursing note and vitals reviewed.  LABORATORY DATA:  I have reviewed the data as listed Lab Results  Component Value Date   WBC 6.9 04/10/2015   HGB 13.4 04/10/2015   HCT 40.2 04/10/2015   MCV 89.7 04/10/2015   PLT 318 04/10/2015   CMP     Component Value Date/Time   NA 135 04/10/2015 1532   NA 137 02/18/2013 1233   K 4.6 04/10/2015 1532   K 4.0 02/18/2013 1233   CL 101 04/10/2015 1532   CL 104 02/18/2013 1233   CO2 24 04/10/2015 1532   CO2 30 02/18/2013 1233   GLUCOSE 76 04/10/2015 1532   GLUCOSE 108* 02/18/2013 1233   BUN 26* 04/10/2015 1532   BUN 33* 02/18/2013 1233   CREATININE 0.60 04/10/2015 1532   CREATININE 0.78 02/18/2013 1233   CALCIUM 9.3 04/10/2015 1532   CALCIUM 8.6 02/18/2013 1233   PROT 6.8 04/10/2015 1532   PROT 7.6 10/18/2011 0919   ALBUMIN 3.7 04/10/2015 1532   ALBUMIN 3.7 10/18/2011 0919   AST 11 04/10/2015 1532   AST 12* 10/18/2011 0919   ALT 8 04/10/2015 1532   ALT 20 10/18/2011 0919   ALKPHOS 52 04/10/2015 1532   ALKPHOS 69 10/18/2011 0919   BILITOT 0.2 04/10/2015 1532   BILITOT 0.2 10/18/2011 0919   GFRNONAA >89 04/10/2015 1532   GFRNONAA >60 02/18/2013 1233   GFRNONAA >60 04/20/2011 1608   GFRAA >89 04/10/2015 1532   GFRAA >60 02/18/2013 1233   GFRAA >60 04/20/2011  1608     RADIOGRAPHIC STUDIES: I have personally reviewed the radiological images as listed and agreed with the findings in the report.  Result Narrative  MRI BRAIN WITHOUT AND WITH CONTRAST   INDICATION: C71.1 Malignant neoplasm of frontal lobe ([place]), brain tumor provided.  COMPARISON: April 18, 2014  TECHNIQUE/PROTOCOL: Standard adult brain protocol.  CONTRAST: 25mL MultiHance IV. This MRI was performed before and after IV administration of contrast material. IV contrast was administered to improve disease detection and further define anatomy.  Complications:  No immediate patient complications or events noted.  FINDINGS: Right frontal craniotomy and underlying surgical defect. There is abnormal T2 hyperintensity involving the right frontal lobe. To a lesser extent there is T2 hyperintensity in the left frontal lobe and when compared to the prior exam this is unchanged. There is minimal thin peripheral intrinsic T1 hyperintensity along the posterior margins of the surgical cavity. No definite enhancement.  No acute infarction or hemorrhage. The extra-axial spaces, basal cisterns, ventricles and sulci are normal. Intracranial flow-voids appear normal. Mildly prominent optic nerve sheaths. The mastoid sinuses, and visualized paranasal sinuses are unremarkable.  IMPRESSION: Stable exam without evidence of disease progression.   Electronically Reviewed by:  Jerrol Banana, MD Electronically Reviewed on:  02/27/2015 9:59 AM  I have reviewed the images and concur with the above findings.  Electronically Signed by:  Romelle Starcher, MD Electronically Signed on:  02/27/2015 2:33 PM    ASSESSMENT & PLAN:  07/08/06: Craniotomy and resection performed by Dr. Dyann Kief. Patient opted for alternative therapies only.  08/07/07: New patient evaluation at [facility]. Recently moved from [place] to [place].  08/23/07: Status post Brain  LAB guided right frontal craniotomy with resection of tumor. 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  08/30/07: Patient presents on postoperative day seven following right frontal craniotomy for recurrent disease. Patient admitted for emergency psychiatric evaluation secondary to pre-existing history, steroids, and tumor location; please refer to admission notes.  09/14/07: Patient has not initiated treatment as of this note. Patient initiates radiation therapy with concurrent low dose daily Temozolomide.  02/19/08: Status post five-day Temozolomide since October 2009 with a two-week break due to thrombocytopenia.  07/18/08: Patient is status post six cycles of five-day Temozolomide and two cycles of metronomic Temozolomide and will now come off treatment due to toxicities.  Last MRI Brain performed on 02/26/2015 at [facility]  She prefers to have an oncologist locally to fill her prescriptions, however she would like to continue having her scans performed at [facility]. We have refilled her methadone, norco, ambien, xanax and depakote ER. These medications are listed on her MAR at [facility] as well.   She will return in 1 month for follow up with Kirby Crigler, PA-C. At her next visit, she will receive her first pneumonia shot and she will be tested for Hepatitis C. We will perform a CBC, CMP and depakote level at this next visit, as well.  Orders Placed This Encounter  Procedures  . Phenytoin level, free and total  . Hepatitis c antibody (reflex)    Standing Status: Future     Number of Occurrences:      Standing Expiration Date: 04/20/2016  . CBC with Differential    Standing Status: Future     Number of Occurrences:      Standing Expiration Date: 04/20/2016  . Comprehensive metabolic panel    Standing Status: Future     Number of Occurrences:      Standing Expiration Date: 04/20/2016  . Phenytoin level, free and total    Standing Status: Future     Number of Occurrences:       Standing Expiration Date: 04/20/2016    All questions were answered. The patient knows to call the clinic with any problems,  questions or concerns.  This document serves as a record of services personally performed by Ancil Linsey, MD. It was created on her behalf by Arlyce Harman, a trained medical scribe. The creation of this record is based on the scribe's personal observations and the provider's statements to them. This document has been checked and approved by the attending provider.  I have reviewed the above documentation for accuracy and completeness, and I agree with the above.  This note was electronically signed.  Molli Hazard, MD  04/21/2015 3:43 PM

## 2015-04-21 NOTE — Patient Instructions (Addendum)
[place] at [facility] Discharge Instructions  RECOMMENDATIONS MADE BY THE CONSULTANT AND ANY TEST RESULTS WILL BE SENT TO YOUR REFERRING PHYSICIAN.  Exam and discussion by Dr Whitney Muse today HEP C with next office visit  Return to see the doctor in 1 month with labs Prevnar at the next office visit Refilled your medication Please call the clinic if you have any questions or concerns    Thank you for choosing [place] at [facility] to provide your oncology and hematology care.  To afford each patient quality time with our provider, please arrive at least 15 minutes before your scheduled appointment time.   Beginning January 23rd 2017 lab work for the [facility] will be done in the  Main lab at [facility] on 1st floor. If you have a lab appointment with the [facility] please come in thru the  Main Entrance and check in at the main information desk  You need to re-schedule your appointment should you arrive 10 or more minutes late.  We strive to give you quality time with our providers, and arriving late affects you and other patients whose appointments are after yours.  Also, if you no show three or more times for appointments you may be dismissed from the clinic at the providers discretion.     Again, thank you for choosing [facility].  Our hope is that these requests will decrease the amount of time that you wait before being seen by our physicians.       _____________________________________________________________  Should you have questions after your visit to [facility], please contact our office at [phone] between the hours of 8:30 a.m. and 4:30 p.m.  Voicemails left after 4:30 p.m. will not be returned until the following business day.  For prescription refill requests, have your pharmacy contact our office.

## 2015-04-25 ENCOUNTER — Telehealth: Payer: Self-pay | Admitting: Family Medicine

## 2015-04-25 MED ORDER — FLUTICASONE PROPIONATE 50 MCG/ACT NA SUSP: 2.0000 | Freq: Every day | NASAL | Status: DC

## 2015-04-25 NOTE — Telephone Encounter (Signed)
Allergies are very bad.  OTC not helping. Please advise something she can take??  Already taking Claritin.  Looking for some sort of nasal spray.

## 2015-04-25 NOTE — Telephone Encounter (Signed)
Rx to pharmacy, left pt message about Rx

## 2015-04-25 NOTE — Telephone Encounter (Signed)
flonase 2 sprays each nostril once daily

## 2015-05-08 ENCOUNTER — Ambulatory Visit (INDEPENDENT_AMBULATORY_CARE_PROVIDER_SITE_OTHER): Payer: PPO | Admitting: Physician Assistant

## 2015-05-08 ENCOUNTER — Encounter: Payer: Self-pay | Admitting: Physician Assistant

## 2015-05-08 VITALS — BP 104/60 | HR 80 | Temp 98.0°F | Resp 18 | Wt 175.0 lb

## 2015-05-08 DIAGNOSIS — J029 Acute pharyngitis, unspecified: Secondary | ICD-10-CM

## 2015-05-08 LAB — STREP GROUP A AG, W/REFLEX TO CULT: STREGTOCOCCUS GROUP A AG SCREEN: NOT DETECTED

## 2015-05-08 NOTE — Progress Notes (Signed)
Patient ID: Jill Harrell MRN: [id number], DOB: [birth year], 55 y.o. Date of Encounter: 05/08/2015, 4:04 PM    Chief Complaint:  Chief Complaint  Patient presents with  . sick x 2 days    severe sore throat     HPI: 55 y.o. year old female presents with above.   Says the left side of her throat feels sore. Has been using lozenges and spray. Says that she is able to blow very little out of her nose but what does come out is watery clear. No chest congestion or cough. No fevers or chills.     Home Meds:   Outpatient Prescriptions Prior to Visit  Medication Sig Dispense Refill  . ALPRAZolam (XANAX) 1 MG tablet Take 1 tablet (1 mg total) by mouth 3 (three) times daily as needed for anxiety. 120 tablet 1  . Calcium-Vitamin D 600-200 MG-UNIT per tablet Take by mouth.    . divalproex (DEPAKOTE) 500 MG DR tablet Take 3 tablets (1,500 mg total) by mouth at bedtime. 90 tablet 3  . fluticasone (FLONASE) 50 MCG/ACT nasal spray Place 2 sprays into both nostrils daily. 16 g 6  . HYDROcodone-acetaminophen (NORCO) 10-325 MG tablet Take 1 tablet by mouth every 6 (six) hours as needed (pain). 120 tablet 0  . methadone (DOLOPHINE) 10 MG tablet Take 1 tablet (10 mg total) by mouth every 8 (eight) hours. 240 tablet 0  . methylphenidate (RITALIN) 10 MG tablet Take 1 tablet (10 mg total) by mouth every morning. 30 tablet 0  . Multiple Vitamins-Minerals (MULTIVITAMIN WITH MINERALS) tablet Take 1 tablet by mouth daily.    . Omega-3 Fatty Acids (FISH OIL) 1000 MG CAPS Take by mouth.    . ondansetron (ZOFRAN) 8 MG tablet TAKE 1 TABLET BY MOUTH EVERY 8 HOURS AS NEEDED FOR NAUSEA AND VOMITING  1  . varenicline (CHANTIX STARTING MONTH PAK) 0.5 MG X 11 & 1 MG X 42 tablet Take one 0.5 mg tablet by mouth once daily for 3 days, then increase to one 0.5 mg tablet twice daily for 4 days, then increase to one 1 mg tablet twice daily. 53 tablet 0  . zolpidem (AMBIEN) 5 MG tablet Take 5 mg by mouth at bedtime as needed  for sleep.     No facility-administered medications prior to visit.    Allergies:  Allergies  Allergen Reactions  . Contrast Media [Iodinated Diagnostic Agents] Nausea And Vomiting      Review of Systems: See HPI for pertinent ROS. All other ROS negative.    Physical Exam: Blood pressure 104/60, pulse 80, temperature 98 F (36.7 C), temperature source Oral, resp. rate 18, weight 175 lb (79.379 kg)., Body mass index is 29.12 kg/(m^2). General:  WF. Appears in no acute distress. HEENT: Normocephalic, atraumatic, eyes without discharge, sclera non-icteric, nares are without discharge. Bilateral auditory canals clear, TM's are without perforation, pearly grey and translucent with reflective cone of light bilaterally. Oral cavity moist, posterior pharynx without exudate, erythema, peritonsillar abscess.  Neck: Supple. No thyromegaly. No lymphadenopathy. She has tenderness with palpation of left side of neck with palpation along trapezius.  Lungs: Clear bilaterally to auscultation without wheezes, rales, or rhonchi. Breathing is unlabored. Heart: Regular rhythm. No murmurs, rubs, or gallops. Msk:  Strength and tone normal for age. Extremities/Skin: Warm and dry.  Neuro: Alert and oriented X 3. Moves all extremities spontaneously. Gait is normal. CNII-XII grossly in tact. Psych:  Responds to questions appropriately with a normal affect.   Results  for orders placed or performed in visit on 05/08/15  STREP GROUP A AG, W/REFLEX TO CULT  Result Value Ref Range   SOURCE THROAT    STREGTOCOCCUS GROUP A AG SCREEN Not Detected      ASSESSMENT AND PLAN:  55 y.o. year old female with  1. Viral pharyngitis Continue to use lozenges spray Tylenol Motrin for symptom relief. If symptoms worsen significantly or develops fever, then follow-up. Otherwise if symptoms persist greater than 7-10 days, then follow-up as well.  2. Sorethroat - STREP GROUP A AG, W/REFLEX TO CULT   Signed, [address]  [place], [place], [facility] 05/08/2015 4:04 PM

## 2015-05-22 ENCOUNTER — Ambulatory Visit (HOSPITAL_COMMUNITY): Payer: PPO

## 2015-05-22 ENCOUNTER — Ambulatory Visit (HOSPITAL_COMMUNITY): Payer: PPO | Admitting: Oncology

## 2015-05-22 ENCOUNTER — Other Ambulatory Visit (HOSPITAL_COMMUNITY): Payer: PPO

## 2015-05-23 ENCOUNTER — Encounter (HOSPITAL_COMMUNITY): Payer: Self-pay | Admitting: Oncology

## 2015-05-23 ENCOUNTER — Encounter (HOSPITAL_COMMUNITY): Payer: PPO | Attending: Hematology & Oncology | Admitting: Oncology

## 2015-05-23 ENCOUNTER — Encounter (HOSPITAL_COMMUNITY): Payer: PPO

## 2015-05-23 DIAGNOSIS — R51 Headache: Secondary | ICD-10-CM | POA: Diagnosis not present

## 2015-05-23 DIAGNOSIS — Z0000 Encounter for general adult medical examination without abnormal findings: Secondary | ICD-10-CM | POA: Diagnosis not present

## 2015-05-23 DIAGNOSIS — R569 Unspecified convulsions: Secondary | ICD-10-CM | POA: Diagnosis not present

## 2015-05-23 DIAGNOSIS — C711 Malignant neoplasm of frontal lobe: Secondary | ICD-10-CM

## 2015-05-23 DIAGNOSIS — C719 Malignant neoplasm of brain, unspecified: Secondary | ICD-10-CM | POA: Diagnosis not present

## 2015-05-23 DIAGNOSIS — Z23 Encounter for immunization: Secondary | ICD-10-CM | POA: Diagnosis not present

## 2015-05-23 LAB — CBC WITH DIFFERENTIAL/PLATELET
Basophils Absolute: 0 10*3/uL (ref 0.0–0.1)
Basophils Relative: 0 %
Eosinophils Absolute: 0.1 10*3/uL (ref 0.0–0.7)
Eosinophils Relative: 2 %
HCT: 39.3 % (ref 36.0–46.0)
Hemoglobin: 13.1 g/dL (ref 12.0–15.0)
Lymphocytes Relative: 24 %
Lymphs Abs: 2 10*3/uL (ref 0.7–4.0)
MCH: 30.3 pg (ref 26.0–34.0)
MCHC: 33.3 g/dL (ref 30.0–36.0)
MCV: 90.8 fL (ref 78.0–100.0)
Monocytes Absolute: 0.5 10*3/uL (ref 0.1–1.0)
Monocytes Relative: 6 %
Neutro Abs: 5.7 10*3/uL (ref 1.7–7.7)
Neutrophils Relative %: 68 %
Platelets: 233 10*3/uL (ref 150–400)
RBC: 4.33 MIL/uL (ref 3.87–5.11)
RDW: 13.2 % (ref 11.5–15.5)
WBC: 8.3 10*3/uL (ref 4.0–10.5)

## 2015-05-23 LAB — COMPREHENSIVE METABOLIC PANEL
ALT: 28 U/L (ref 14–54)
AST: 27 U/L (ref 15–41)
Albumin: 3.5 g/dL (ref 3.5–5.0)
Alkaline Phosphatase: 50 U/L (ref 38–126)
Anion gap: 9 (ref 5–15)
BUN: 16 mg/dL (ref 6–20)
CO2: 25 mmol/L (ref 22–32)
Calcium: 8.7 mg/dL — ABNORMAL LOW (ref 8.9–10.3)
Chloride: 107 mmol/L (ref 101–111)
Creatinine, Ser: 0.53 mg/dL (ref 0.44–1.00)
GFR calc Af Amer: >60 mL/min (ref >60)
GFR calc non Af Amer: >60 mL/min (ref >60)
Glucose, Bld: 120 mg/dL — ABNORMAL HIGH (ref 65–99)
Potassium: 4 mmol/L (ref 3.5–5.1)
Sodium: 141 mmol/L (ref 135–145)
Total Bilirubin: 0.3 mg/dL (ref 0.3–1.2)
Total Protein: 6.7 g/dL (ref 6.5–8.1)

## 2015-05-23 MED ORDER — PNEUMOCOCCAL 13-VAL CONJ VACC IM SUSP
INTRAMUSCULAR | Status: AC
  Filled 2015-05-23: qty 0.5

## 2015-05-23 MED ORDER — ONDANSETRON HCL 8 MG PO TABS: 8.0000 mg | ORAL_TABLET | Freq: Three times a day (TID) | ORAL | Status: DC | PRN

## 2015-05-23 MED ORDER — PNEUMOCOCCAL 13-VAL CONJ VACC IM SUSP
0.5000 mL | Freq: Once | INTRAMUSCULAR | Status: AC
  Administered 2015-05-23: 0.5 mL via INTRAMUSCULAR

## 2015-05-23 MED ORDER — HYDROCODONE-ACETAMINOPHEN 10-325 MG PO TABS: 1.0000 | ORAL_TABLET | Freq: Four times a day (QID) | ORAL | Status: DC | PRN

## 2015-05-23 MED ORDER — ALPRAZOLAM 1 MG PO TABS: 1.0000 mg | ORAL_TABLET | Freq: Three times a day (TID) | ORAL | Status: DC | PRN

## 2015-05-23 MED ORDER — METHADONE HCL 10 MG PO TABS: 10.0000 mg | ORAL_TABLET | Freq: Three times a day (TID) | ORAL | Status: DC

## 2015-05-23 MED ORDER — ZOLPIDEM TARTRATE 5 MG PO TABS: 5.0000 mg | ORAL_TABLET | Freq: Every evening | ORAL | Status: DC | PRN

## 2015-05-23 NOTE — Assessment & Plan Note (Addendum)
07/08/06: Craniotomy and resection performed by Dr. Dyann Kief. Patient opted for alternative therapies only.  08/07/07: New patient evaluation at [facility]. Recently moved from [place] to [place].  08/23/07: Status post Brain LAB guided right frontal craniotomy with resection of tumor. 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  08/30/07: Patient presents on postoperative day seven following right frontal craniotomy for recurrent disease. Patient admitted for emergency psychiatric evaluation secondary to pre-existing history, steroids, and tumor location; please refer to admission notes.  09/14/07: Patient has not initiated treatment as of this note. Patient initiates radiation therapy with concurrent low dose daily Temozolomide.  02/19/08: Status post five-day Temozolomide since October 2009 with a two-week break due to thrombocytopenia.  07/18/08: Patient is status post six cycles of five-day Temozolomide and two cycles of metronomic Temozolomide and will now come off treatment due to toxicities.   Labs today as ordered: CBC diff, CMET, Phenytoin level, and Hepatitis C antibody.  Prevnar 13 today.  Will give PSV23 in >/= 8 weeks according to UpToDate algorithm for pneumococcal vaccination in immunocompromised adults (>/= 19 years).  I have refilled the following medications for the patient: methadone, hydrocodone, ambien, Zofran, alprazolam.  No labs in 4 weeks.  Labs in 8 weeks: CBC diff, CMET, Phenytoin level  Order is placed for PSV23 in 2 months (weeks post .  She notes a severe headache x 3 days.  She was going to report to the ED last night, but thought she would wait until today.  She has chronic headaches monthly.  She denies this being the worst headache ever.  She would like to be escorted to the ED and we will oblige.  Return in 4 weeks for follow-up.

## 2015-05-23 NOTE — Progress Notes (Signed)
Jill Harrell presents today for injection per MD orders. Prenar 13 administered IM in right Upper Arm. Administration without incident. Patient tolerated well.

## 2015-05-23 NOTE — Progress Notes (Signed)
Jill Fraction, MD [address] [place] [address]  Glioblastoma determined by biopsy of brain ([place]) - Plan: CBC with Differential, Comprehensive metabolic panel  Seizures ([place]) - Plan: CBC with Differential, Comprehensive metabolic panel, Phenytoin level, free and total  Preventative health care - Plan: Pneumococcal polysaccharide vaccine 23-valent greater than or equal to 53yo subcutaneous/IM  CURRENT THERAPY: Surveillance  INTERVAL HISTORY: Jill Harrell 55 y.o. female returns for followup of glioblastoma originally diagnosed in 2008.  Oncology History: 07/08/06: Craniotomy and resection performed by Dr. Dyann Kief. Patient opted for alternative therapies only.  08/07/07: New patient evaluation at [facility]. Recently moved from [place] to [place].  08/23/07: Status post Brain LAB guided right frontal craniotomy with resection of tumor. 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  08/30/07: Patient presents on postoperative day seven following right frontal craniotomy for recurrent disease. Patient admitted for emergency psychiatric evaluation secondary to pre-existing history, steroids, and tumor location; please refer to admission notes.  09/14/07: Patient has not initiated treatment as of this note. Patient initiates radiation therapy with concurrent low dose daily Temozolomide.  02/19/08: Status post five-day Temozolomide since October 2009 with a two-week break due to thrombocytopenia.  07/18/08: Patient is status post six cycles of five-day Temozolomide and two cycles of metronomic Temozolomide and will now come off treatment due to toxicities.   I personally reviewed and went over laboratory results with the patient.  The results are noted within this dictation.  Labs will be updated today. Her laboratory work is stable. Her phenytoin level and hepatitis testing is pending at this  time.  She needs refills on a number of medications and I will address that today.  She is scheduled for Prevnar 13 injection today and will administer as planned.  Patient is seen in the exam room in complete darkness. She is uncomfortable and suffering from a migraine headache. She notes these on a monthly basis. She notes that these are usually treated in the emergency department and are common.  Today is not her worst headache she reports.  It has been ongoing x 3 days.  She notes that it is exacerbated by light and noise.  She notes nausea with vomiting as well.  Past Medical History  Diagnosis Date  . Anxiety   . Cancer ([place])   . Brain cancer ([place])     2009 glioblastoma (stage 4) Dr. Bayard Hugger  . Seizures ([place])   . Arthritis     hip and wrist    has Symptomatic menopausal or female climacteric states; Seizures ([place]); Glioblastoma determined by biopsy of brain ([place]); and Migraines on her problem list.     is allergic to contrast media.  Current Outpatient Prescriptions on File Prior to Visit  Medication Sig Dispense Refill  . Calcium-Vitamin D 600-200 MG-UNIT per tablet Take by mouth.    . divalproex (DEPAKOTE) 500 MG DR tablet Take 3 tablets (1,500 mg total) by mouth at bedtime. 90 tablet 3  . fluticasone (FLONASE) 50 MCG/ACT nasal spray Place 2 sprays into both nostrils daily. 16 g 6  . Loratadine-Pseudoephedrine (CLARITIN-D 24 HOUR PO) Take by mouth.    . methylphenidate (RITALIN) 10 MG tablet Take 1 tablet (10 mg total) by mouth every morning. 30 tablet 0  . Multiple Vitamins-Minerals (MULTIVITAMIN WITH MINERALS) tablet Take 1 tablet by mouth daily.    . Omega-3 Fatty Acids (FISH OIL) 1000 MG CAPS  Take by mouth.    . varenicline (CHANTIX STARTING MONTH PAK) 0.5 MG X 11 & 1 MG X 42 tablet Take one 0.5 mg tablet by mouth once daily for 3 days, then increase to one 0.5 mg tablet twice daily for 4 days, then increase to one 1 mg tablet twice daily. 53 tablet 0   No current  facility-administered medications on file prior to visit.    Past Surgical History  Procedure Laterality Date  . Abdominal hysterectomy    . Cholecystectomy    . Brain surgery x2    . Appendectomy      Denies any headaches, dizziness, double vision, fevers, chills, night sweats, nausea, vomiting, diarrhea, constipation, chest pain, heart palpitations, shortness of breath, blood in stool, black tarry stool, urinary pain, urinary burning, urinary frequency, hematuria.   PHYSICAL EXAMINATION  ECOG PERFORMANCE STATUS: 1 - Symptomatic but completely ambulatory  There were no vitals filed for this visit.  GENERAL:alert, cooperative, smiling and sitting on exam table with sunglasses in place, cooperative, and pleasant, but uncomfortable. SKIN: skin color, texture, turgor are normal, no rashes or significant lesions HEAD: Normocephalic, No masses, lesions, tenderness or abnormalities, right craniotomy scar noted. EYES: sunglasses in place EARS: External ears normal OROPHARYNX:lips, buccal mucosa, and tongue normal and mucous membranes are moist  NECK: supple, trachea midline LYMPH:  not examined BREAST:not examined LUNGS: clear to auscultation  HEART: regular rate & rhythm ABDOMEN:abdomen soft and normal bowel sounds BACK: Back symmetric, no curvature. EXTREMITIES:less then 2 second capillary refill, no skin discoloration, no cyanosis  NEURO: alert & oriented x 3 with fluent speech   LABORATORY DATA: CBC    Component Value Date/Time   WBC 8.3 05/23/2015 0932   WBC 14.7* 02/18/2013 1233   RBC 4.33 05/23/2015 0932   RBC 4.25 02/18/2013 1233   HGB 13.1 05/23/2015 0932   HGB 12.6 02/18/2013 1233   HCT 39.3 05/23/2015 0932   HCT 39.6 02/18/2013 1233   PLT 233 05/23/2015 0932   PLT 230 02/18/2013 1233   MCV 90.8 05/23/2015 0932   MCV 93 02/18/2013 1233   MCH 30.3 05/23/2015 0932   MCH 29.6 02/18/2013 1233   MCHC 33.3 05/23/2015 0932   MCHC 31.8* 02/18/2013 1233   RDW 13.2  05/23/2015 0932   RDW 13.8 02/18/2013 1233   LYMPHSABS 2.0 05/23/2015 0932   LYMPHSABS 2.4 10/18/2011 0919   MONOABS 0.5 05/23/2015 0932   MONOABS 0.6 10/18/2011 0919   EOSABS 0.1 05/23/2015 0932   EOSABS 0.2 10/18/2011 0919   BASOSABS 0.0 05/23/2015 0932   BASOSABS 0.0 10/18/2011 0919      Chemistry      Component Value Date/Time   NA 141 05/23/2015 0932   NA 137 02/18/2013 1233   K 4.0 05/23/2015 0932   K 4.0 02/18/2013 1233   CL 107 05/23/2015 0932   CL 104 02/18/2013 1233   CO2 25 05/23/2015 0932   CO2 30 02/18/2013 1233   BUN 16 05/23/2015 0932   BUN 33* 02/18/2013 1233   CREATININE 0.53 05/23/2015 0932   CREATININE 0.60 04/10/2015 1532   CREATININE 0.78 02/18/2013 1233      Component Value Date/Time   CALCIUM 8.7* 05/23/2015 0932   CALCIUM 8.6 02/18/2013 1233   ALKPHOS 50 05/23/2015 0932   ALKPHOS 69 10/18/2011 0919   AST 27 05/23/2015 0932   AST 12* 10/18/2011 0919   ALT 28 05/23/2015 0932   ALT 20 10/18/2011 0919   BILITOT 0.3 05/23/2015 0932  BILITOT 0.2 10/18/2011 0919        PENDING LABS:   RADIOGRAPHIC STUDIES:  No results found.   PATHOLOGY:    ASSESSMENT AND PLAN:  Glioblastoma determined by biopsy of brain ([place]) 07/08/06: Craniotomy and resection performed by Dr. Dyann Kief. Patient opted for alternative therapies only.  08/07/07: New patient evaluation at [facility]. Recently moved from [place] to [place].  08/23/07: Status post Brain LAB guided right frontal craniotomy with resection of tumor. 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  08/30/07: Patient presents on postoperative day seven following right frontal craniotomy for recurrent disease. Patient admitted for emergency psychiatric evaluation secondary to pre-existing history, steroids, and tumor location; please refer to admission notes.  09/14/07: Patient has not initiated treatment as of this note. Patient  initiates radiation therapy with concurrent low dose daily Temozolomide.  02/19/08: Status post five-day Temozolomide since October 2009 with a two-week break due to thrombocytopenia.  07/18/08: Patient is status post six cycles of five-day Temozolomide and two cycles of metronomic Temozolomide and will now come off treatment due to toxicities.   Labs today as ordered: CBC diff, CMET, Phenytoin level, and Hepatitis C antibody.  Prevnar 13 today.  Will give PSV23 in >/= 8 weeks according to UpToDate algorithm for pneumococcal vaccination in immunocompromised adults (>/= 19 years).  I have refilled the following medications for the patient: methadone, hydrocodone, ambien, Zofran, alprazolam.  No labs in 4 weeks.  Labs in 8 weeks: CBC diff, CMET, Phenytoin level  Order is placed for PSV23 in 2 months (weeks post .  She notes a severe headache x 3 days.  She was going to report to the ED last night, but thought she would wait until today.  She has chronic headaches monthly.  She denies this being the worst headache ever.  She would like to be escorted to the ED and we will oblige.  Return in 4 weeks for follow-up.   THERAPY PLAN:  Continue with symptom management and surveillance.  She has a return appointment at [facility] in September 2017.  All questions were answered. The patient knows to call the clinic with any problems, questions or concerns. We can certainly see the patient much sooner if necessary.  Patient and plan discussed with Dr. Ancil Linsey and she is in agreement with the aforementioned.   This note is electronically signed by: Doy Mince 05/23/2015 4:48 PM

## 2015-05-23 NOTE — Patient Instructions (Signed)
[place] at [facility] Discharge Instructions  RECOMMENDATIONS MADE BY THE CONSULTANT AND ANY TEST RESULTS WILL BE SENT TO YOUR REFERRING PHYSICIAN.  Exam done and seen today by Jill Harrell Go to the ER to get a shot for your migraine Will give refills on methadone, hydrocodone, ambien, zofran. Goes to [facility] once a year, next brain MRI in September 2017. Return to see the Doctor in one month Call the clinic for any concerns or questions.   Thank you for choosing [place] at [facility] to provide your oncology and hematology care.  To afford each patient quality time with our provider, please arrive at least 15 minutes before your scheduled appointment time.   Beginning January 23rd 2017 lab work for the [facility] will be done in the  Main lab at [facility] on 1st floor. If you have a lab appointment with the [facility] please come in thru the  Main Entrance and check in at the main information desk  You need to re-schedule your appointment should you arrive 10 or more minutes late.  We strive to give you quality time with our providers, and arriving late affects you and other patients whose appointments are after yours.  Also, if you no show three or more times for appointments you may be dismissed from the clinic at the providers discretion.     Again, thank you for choosing [facility].  Our hope is that these requests will decrease the amount of time that you wait before being seen by our physicians.       _____________________________________________________________  Should you have questions after your visit to [facility], please contact our office at [phone] between the hours of 8:30 a.m. and 4:30 p.m.  Voicemails left after 4:30 p.m. will not be returned until the following business day.  For prescription refill requests, have your pharmacy contact our office.         Resources For Cancer  Patients and their Caregivers ? American Cancer Society: Can assist with transportation, wigs, general needs, runs Look Good Feel Better.        862-506-9366 ? Cancer Care: Provides financial assistance, online support groups, medication/co-pay assistance.  1-800-813-HOPE (564)138-6138) ? [facility] Assists [place] cancer patients and their families through emotional , educational and financial support.  646-039-7966 ? [place] DSS Where to apply for food stamps, Medicaid and utility assistance. 417-391-1258 ? [facility]: Transportation to medical appointments. 684-629-1904 ? Social Security Administration: May apply for disability if have a Stage IV cancer. (860) 136-4594 (754) 480-3941 ? [employer], Disability and Transit Services: Assists with nutrition, care and transit needs. [phone]

## 2015-05-24 LAB — HCV COMMENT:

## 2015-05-24 LAB — HEPATITIS C ANTIBODY (REFLEX): HCV Ab: <0.1 s/co ratio (ref 0.0–0.9)

## 2015-05-26 LAB — PHENYTOIN LEVEL, FREE AND TOTAL
Phenytoin, Free: NOT DETECTED ug/mL (ref 1.0–2.0)
Phenytoin, Total: <0.8 ug/mL — ABNORMAL LOW (ref 10.0–20.0)

## 2015-05-27 ENCOUNTER — Other Ambulatory Visit (HOSPITAL_COMMUNITY): Payer: Self-pay | Admitting: Oncology

## 2015-05-27 DIAGNOSIS — R569 Unspecified convulsions: Secondary | ICD-10-CM

## 2015-05-29 ENCOUNTER — Other Ambulatory Visit: Payer: PPO | Admitting: Obstetrics & Gynecology

## 2015-06-03 ENCOUNTER — Encounter: Payer: Self-pay | Admitting: Obstetrics & Gynecology

## 2015-06-03 ENCOUNTER — Ambulatory Visit (INDEPENDENT_AMBULATORY_CARE_PROVIDER_SITE_OTHER): Payer: PPO | Admitting: Obstetrics & Gynecology

## 2015-06-03 ENCOUNTER — Other Ambulatory Visit: Payer: Self-pay | Admitting: Obstetrics & Gynecology

## 2015-06-03 VITALS — BP 110/62 | HR 78 | Ht 63.0 in | Wt 168.9 lb

## 2015-06-03 DIAGNOSIS — Z01419 Encounter for gynecological examination (general) (routine) without abnormal findings: Secondary | ICD-10-CM

## 2015-06-03 DIAGNOSIS — Z1211 Encounter for screening for malignant neoplasm of colon: Secondary | ICD-10-CM

## 2015-06-03 DIAGNOSIS — Z1212 Encounter for screening for malignant neoplasm of rectum: Secondary | ICD-10-CM

## 2015-06-03 DIAGNOSIS — Z1231 Encounter for screening mammogram for malignant neoplasm of breast: Secondary | ICD-10-CM

## 2015-06-03 MED ORDER — ESTRADIOL 1 MG/GM TD GEL: 1.0000 | Freq: Every day | TRANSDERMAL | Status: DC

## 2015-06-03 MED ORDER — DOXYCYCLINE HYCLATE 100 MG PO TABS: 100.0000 mg | ORAL_TABLET | Freq: Two times a day (BID) | ORAL | Status: DC

## 2015-06-03 MED ORDER — POLYETHYLENE GLYCOL 3350 17 GM/SCOOP PO POWD: ORAL | Status: DC

## 2015-06-03 MED ORDER — FLUCONAZOLE 150 MG PO TABS: 150.0000 mg | ORAL_TABLET | Freq: Once | ORAL | Status: DC

## 2015-06-03 MED ORDER — MIRABEGRON ER 50 MG PO TB24: 50.0000 mg | ORAL_TABLET | Freq: Every day | ORAL | Status: DC

## 2015-06-03 NOTE — Progress Notes (Signed)
Patient ID: Jill Harrell, female   DOB: 04-20-1960, 55 y.o.   MRN: [id number] Subjective:     Jill Harrell is a 55 y.o. female here for a routine exam.  No LMP recorded. Patient has had a hysterectomy. No obstetric history on file. Birth Control Method:  hysterectomy Menstrual Calendar(currently): na  Current complaints: urge incontinence.   Current acute medical issues:  glioblastoma   Recent Gynecologic History No LMP recorded. Patient has had a hysterectomy. Last Pap: ,   Last mammogram: 04/2014,  normal  Past Medical History  Diagnosis Date  . Anxiety   . Cancer ([facility])   . Brain cancer ([facility])     2009 glioblastoma (stage 4) Dr. Bayard Hugger  . Seizures ([facility])   . Arthritis     hip and wrist    Past Surgical History  Procedure Laterality Date  . Abdominal hysterectomy    . Cholecystectomy    . Brain surgery x2    . Appendectomy      OB History    Gravida Para Term Preterm AB TAB SAB Ectopic Multiple Living            0      Social History   Social History  . Marital Status: Married    Spouse Name: N/A  . Number of Children: N/A  . Years of Education: N/A   Social History Main Topics  . Smoking status: Current Every Day Smoker -- 0.50 packs/day for 23 years    Types: Cigarettes  . Smokeless tobacco: Never Used  . Alcohol Use: No  . Drug Use: No  . Sexual Activity: Yes    Birth Control/ Protection: Other-see comments   Other Topics Concern  . None   Social History Narrative    Family History  Problem Relation Age of Onset  . Heart disease Father   . Hypotension Father   . Diabetes Father   . Hyperlipidemia Maternal Grandfather   . Hypertension Maternal Grandfather   . Heart disease Maternal Grandfather      Current outpatient prescriptions:  .  ALPRAZolam (XANAX) 1 MG tablet, Take 1 tablet (1 mg total) by mouth 3 (three) times daily as needed for anxiety., Disp: 120 tablet, Rfl: 0 .  Calcium-Vitamin D 600-200 MG-UNIT per tablet, Take by  mouth., Disp: , Rfl:  .  divalproex (DEPAKOTE) 500 MG DR tablet, Take 3 tablets (1,500 mg total) by mouth at bedtime., Disp: 90 tablet, Rfl: 3 .  fluticasone (FLONASE) 50 MCG/ACT nasal spray, Place 2 sprays into both nostrils daily., Disp: 16 g, Rfl: 6 .  HYDROcodone-acetaminophen (NORCO) 10-325 MG tablet, Take 1 tablet by mouth every 6 (six) hours as needed (pain)., Disp: 120 tablet, Rfl: 0 .  Loratadine-Pseudoephedrine (CLARITIN-D 24 HOUR PO), Take by mouth., Disp: , Rfl:  .  methadone (DOLOPHINE) 10 MG tablet, Take 1 tablet (10 mg total) by mouth every 8 (eight) hours. (Patient taking differently: Take 10 mg by mouth every 4 (four) hours as needed. ), Disp: 240 tablet, Rfl: 0 .  Multiple Vitamins-Minerals (MULTIVITAMIN WITH MINERALS) tablet, Take 1 tablet by mouth daily., Disp: , Rfl:  .  Omega-3 Fatty Acids (FISH OIL) 1000 MG CAPS, Take by mouth., Disp: , Rfl:  .  ondansetron (ZOFRAN) 8 MG tablet, Take 1 tablet (8 mg total) by mouth every 8 (eight) hours as needed for nausea or vomiting., Disp: 90 tablet, Rfl: 3 .  zolpidem (AMBIEN) 5 MG tablet, Take 1 tablet (5 mg total) by mouth at  bedtime as needed for sleep., Disp: 30 tablet, Rfl: 0 .  doxycycline (VIBRA-TABS) 100 MG tablet, Take 1 tablet (100 mg total) by mouth 2 (two) times daily., Disp: 20 tablet, Rfl: 0 .  Estradiol (DIVIGEL) 1 MG/GM GEL, Place 1 packet onto the skin daily., Disp: 30 g, Rfl: 11 .  fluconazole (DIFLUCAN) 150 MG tablet, Take 1 tablet (150 mg total) by mouth once. Take the second tablet 3 days after the first one., Disp: 2 tablet, Rfl: 0 .  methylphenidate (RITALIN) 10 MG tablet, Take 1 tablet (10 mg total) by mouth every morning. (Patient not taking: Reported on 06/03/2015), Disp: 30 tablet, Rfl: 0 .  mirabegron ER (MYRBETRIQ) 50 MG TB24 tablet, Take 1 tablet (50 mg total) by mouth daily., Disp: 30 tablet, Rfl: 11 .  polyethylene glycol powder (GLYCOLAX/MIRALAX) powder, 1 scoop daily or as needed, Disp: 255 g, Rfl:  11  Review of Systems  Review of Systems  Constitutional: Negative for fever, chills, weight loss, malaise/fatigue and diaphoresis.  HENT: Negative for hearing loss, ear pain, nosebleeds, congestion, sore throat, neck pain, tinnitus and ear discharge.   Eyes: Negative for blurred vision, double vision, photophobia, pain, discharge and redness.  Respiratory: Negative for cough, hemoptysis, sputum production, shortness of breath, wheezing and stridor.   Cardiovascular: Negative for chest pain, palpitations, orthopnea, claudication, leg swelling and PND.  Gastrointestinal: negative for abdominal pain. Negative for heartburn, nausea, vomiting, diarrhea, constipation, blood in stool and melena.  Genitourinary: Negative for dysuria, urgency, frequency, hematuria and flank pain.  Musculoskeletal: Negative for myalgias, back pain, joint pain and falls.  Skin: Negative for itching and rash.  Neurological: Negative for dizziness, tingling, tremors, sensory change, speech change, focal weakness, seizures, loss of consciousness, weakness and headaches.  Endo/Heme/Allergies: Negative for environmental allergies and polydipsia. Does not bruise/bleed easily.  Psychiatric/Behavioral: Negative for depression, suicidal ideas, hallucinations, memory loss and substance abuse. The patient is not nervous/anxious and does not have insomnia.        Objective:  Blood pressure 110/62, pulse 78, height 5\' 3"  (1.6 m), weight 168 lb 14.4 oz (76.613 kg).   Physical Exam  Vitals reviewed. Constitutional: She is oriented to person, place, and time. She appears well-developed and well-nourished.  HENT:  Head: Normocephalic and atraumatic.        Right Ear: External ear normal.  Left Ear: External ear normal.  Nose: Nose normal.  Mouth/Throat: Oropharynx is clear and moist.  Eyes: Conjunctivae and EOM are normal. Pupils are equal, round, and reactive to light. Right eye exhibits no discharge. Left eye exhibits no  discharge. No scleral icterus.  Neck: Normal range of motion. Neck supple. No tracheal deviation present. No thyromegaly present.  Cardiovascular: Normal rate, regular rhythm, normal heart sounds and intact distal pulses.  Exam reveals no gallop and no friction rub.   No murmur heard. Respiratory: Effort normal and breath sounds normal. No respiratory distress. She has no wheezes. She has no rales. She exhibits no tenderness.  GI: Soft. Bowel sounds are normal. She exhibits no distension and no mass. There is no tenderness. There is no rebound and no guarding.  Genitourinary:  Breasts no masses skin changes or nipple changes bilaterally      Vulva is normal without lesions Vagina is pink moist without discharge Cervix absent Uterus is absent Adnexa is negative {Rectal    hemoccult negative, normal tone, no masses  Musculoskeletal: Normal range of motion. She exhibits no edema and no tenderness.  Neurological: She is alert  and oriented to person, place, and time. She has normal reflexes. She displays normal reflexes. No cranial nerve deficit. She exhibits normal muscle tone. Coordination normal.  Skin: Skin is warm and dry. No rash noted. No erythema. No pallor.  Psychiatric: She has a normal mood and affect. Her behavior is normal. Judgment and thought content normal.       Medications Ordered at today's visit: Meds ordered this encounter  Medications  . mirabegron ER (MYRBETRIQ) 50 MG TB24 tablet    Sig: Take 1 tablet (50 mg total) by mouth daily.    Dispense:  30 tablet    Refill:  11  . doxycycline (VIBRA-TABS) 100 MG tablet    Sig: Take 1 tablet (100 mg total) by mouth 2 (two) times daily.    Dispense:  20 tablet    Refill:  0  . fluconazole (DIFLUCAN) 150 MG tablet    Sig: Take 1 tablet (150 mg total) by mouth once. Take the second tablet 3 days after the first one.    Dispense:  2 tablet    Refill:  0  . polyethylene glycol powder (GLYCOLAX/MIRALAX) powder    Sig: 1 scoop  daily or as needed    Dispense:  255 g    Refill:  11  . Estradiol (DIVIGEL) 1 MG/GM GEL    Sig: Place 1 packet onto the skin daily.    Dispense:  30 g    Refill:  11    Other orders placed at today's visit: No orders of the defined types were placed in this encounter.      Assessment:    Healthy female exam.   urge incontinence Plan:    Mammogram ordered. Follow up in: 1 year. mirbetriq for incontinence

## 2015-06-04 ENCOUNTER — Telehealth: Payer: Self-pay | Admitting: Obstetrics & Gynecology

## 2015-06-04 NOTE — Telephone Encounter (Signed)
Pt informed her Divigel was e-scribed to [facility], [place]. Myrbetriq medication is requiring a PA. Will contact pt once PA completed. Pt verbalized understanding.

## 2015-06-04 NOTE — Telephone Encounter (Signed)
Pt called stating that she was here yesterday and Dr. Elonda Husky was suppose to sent her hormone medication to the pharmacy, pt states that when she went to [facility] they state they never received it. Please contact pt

## 2015-06-05 ENCOUNTER — Telehealth: Payer: Self-pay | Admitting: *Deleted

## 2015-06-05 ENCOUNTER — Telehealth: Payer: Self-pay | Admitting: Obstetrics & Gynecology

## 2015-06-05 MED ORDER — OXYBUTYNIN CHLORIDE 5 MG PO TABS: 5.0000 mg | ORAL_TABLET | Freq: Three times a day (TID) | ORAL | Status: DC

## 2015-06-05 NOTE — Telephone Encounter (Signed)
Pt insurance will not cover Myrbetriq but will cover Oxybutynin Chloride tablet. Please advise.

## 2015-06-05 NOTE — Telephone Encounter (Signed)
Pt informed Oxybutynin e-scribed.

## 2015-06-06 ENCOUNTER — Telehealth: Payer: Self-pay | Admitting: *Deleted

## 2015-06-06 NOTE — Telephone Encounter (Signed)
Pt states forgot to let us know about a procedure she had done along time ago, hemorrhoidectomy. Pt also state how long before she will see the results of taking Miralax for constipation. Informed pt could take up to 1-2 weeks, however her taking a narcotic for pain could be the cause of the constipaton. Increase foods with fiber,and water to help with the constipation. Pt verbalized understanding.

## 2015-06-09 ENCOUNTER — Ambulatory Visit (HOSPITAL_COMMUNITY): Payer: PPO

## 2015-06-10 ENCOUNTER — Other Ambulatory Visit (HOSPITAL_COMMUNITY): Payer: Self-pay | Admitting: Oncology

## 2015-06-10 ENCOUNTER — Telehealth (HOSPITAL_COMMUNITY): Payer: Self-pay | Admitting: Oncology

## 2015-06-10 DIAGNOSIS — C719 Malignant neoplasm of brain, unspecified: Secondary | ICD-10-CM

## 2015-06-10 MED ORDER — METHADONE HCL 10 MG PO TABS: 10.0000 mg | ORAL_TABLET | Freq: Four times a day (QID) | ORAL | Status: DC

## 2015-06-10 MED ORDER — METHADONE HCL 10 MG PO TABS: 10.0000 mg | ORAL_TABLET | Freq: Three times a day (TID) | ORAL | Status: DC

## 2015-06-10 NOTE — Telephone Encounter (Signed)
[facility] has called [place] reporting that the patient is attempting to get her Methadone refilled sooner than expected.  I have logged into the [facility] Controlled Substance Website to review her controlled substance medications, with particular attention to her Methadone prescriptions.  She received #240 of 10 mg of Methadone on 02/13/2015 prescribed by physician in [facility].  This was filled at [facility].  I called [facility] and the directions for this Rx was 1-2 tablets of Methadone every 6 hours PRN.  She then filled our prescription of Methadone 10 mg #240 written on 2/20 on 3/16.  TODAY, 4/11, [facility] is calling regarding her Methadone Rx requesting to be filled today for the Rx I wrote on 05/26/2015.  I called the patient. She reports increased migraines with seasonal allergies which require increased needs for methadone.  She reports that her [facility] doctor understood this and therefore allowed her to take Methadone more frequently during allergy season.  "I forgot to tell you guys this."  She initially told me that she only has 0.5 tablet left of Methadone, but I learned later in our conversation that she has 2 tablets that are halved left.    I asked her about how she is taking her Methadone.  She reports that on Sunday, she developed a terrible migraine.  So she took 1.5 tablets of Methadone prior to church.  She subsequently took 2 more after church and slept all day Sunday and woke up Monday.  She admits to take 3-4 tablets of Methadone per day sometime depending on her headache.    I explained to the patient that the math does not add up to #240 tablets in a 4 week time span.  So I asked, "where are all of your other tablets going?"  "In me" she responds.   She is educated that Methadone is a long-acting pain medication and does not provide short term relief.  That is the role of her Hydrocodone.  She reports that her Hydrocodone does not provide  her pain relief for seasonal allergy induced migraines.  I educated her that #240 tablets in a 4 week period equates to 8.5 tablets daily.  From what I hear, she is not using 8.5 tablets in a 24 hour period. "Tom, my head hurts so bad that I could not tell you exactly how many I take."  She is advised that her Rx will be changed to 1 tablet every 8 hours #90 and that is a 30 day supply.  New Rx is printed and available for pick-up.  We will need to consider a pain contract with the patient and random urine drug screens.    Robynn Pane, PA-C 06/10/2015 4:56 PM

## 2015-06-17 ENCOUNTER — Telehealth (HOSPITAL_COMMUNITY): Payer: Self-pay | Admitting: Oncology

## 2015-06-17 ENCOUNTER — Telehealth (HOSPITAL_COMMUNITY): Payer: Self-pay

## 2015-06-17 NOTE — Telephone Encounter (Signed)
-----   Message from Epifanio Lesches sent at 06/17/2015  9:56 AM EDT ----- Would like to speak with Dr Mamie Nick about a private issue. She missed a call from a nurse on 4/17 but she does not know who called.

## 2015-06-17 NOTE — Telephone Encounter (Signed)
Patient called reporting that she will be running out of her methadone soon and requesting a refill.  PLEASE SEE PREVIOUS TELEPHONE ENCOUNTER.  I have reviewed the [place] Controlled Substance Reporting System.  She got #90 of Methadone filled on 06/11/2015.  This was a 1 month supply.  This is discussed with Dr. Whitney Muse and we are both very concerned for diversion of narcotics.  Based upon the pharmacokinetics of methadone, its long half life, and variability regarding absorption/onset of action/etc we will not refill her Methadone at this time.  The patient reports that she will go back to [facility] and have her medications refilled then.  This is probably the same practice who provided her #240 on a PRN basis.   Methadone is not a PRN medication. I have searched the [facility] and her last prescriber of Methadone in [place] was Violet Baldy, PA-C at [facility] and her supervising physician is Donnamarie Poag, MD (according to [facility]).  At this time, we will cancel her follow-up appointment with [place].  We will no longer prescribe this patient's medications.    "Can I use Dr. Whitney Muse for an emergency contact if needed."  Patient is advised that she would be better off using another provider/oncologist.  For now, the patient will be discharged from the clinic for inappropriate behavior regarding narcotic usage, despite telephone encounter, education, and directions on the proper use of Methadone.  Patient and plan discussed with Dr. Ancil Linsey and she is in agreement with the aforementioned.   Robynn Pane, PA-C 06/17/2015 3:59 PM

## 2015-06-17 NOTE — Telephone Encounter (Signed)
Returned patient's phone call. She was requesting to speak to Dr. Whitney Muse about her methadone prescription being changed back to its original dosage. Dr. Whitney Muse wanted me to inform the patient that she will have to return to her primary doctor in [facility]. For her medications and care. We will no longer be able to care for her  per Dr. Whitney Muse.

## 2015-06-19 ENCOUNTER — Telehealth (HOSPITAL_COMMUNITY): Payer: Self-pay | Admitting: *Deleted

## 2015-06-19 ENCOUNTER — Other Ambulatory Visit (HOSPITAL_COMMUNITY): Payer: Self-pay | Admitting: Oncology

## 2015-06-19 ENCOUNTER — Encounter (HOSPITAL_COMMUNITY): Payer: Self-pay | Admitting: Oncology

## 2015-06-19 ENCOUNTER — Other Ambulatory Visit (HOSPITAL_COMMUNITY): Payer: Self-pay | Admitting: Emergency Medicine

## 2015-06-19 NOTE — Telephone Encounter (Signed)
Tried to call pt no answer, pt does not need pnemonia vaccine for 5 years.  Just have shot 23 last year and the prevnar 13 a month ago

## 2015-06-20 ENCOUNTER — Ambulatory Visit (HOSPITAL_COMMUNITY): Payer: PPO | Admitting: Oncology

## 2015-06-20 ENCOUNTER — Other Ambulatory Visit (HOSPITAL_COMMUNITY): Payer: PPO

## 2015-06-20 DIAGNOSIS — C711 Malignant neoplasm of frontal lobe: Secondary | ICD-10-CM | POA: Diagnosis not present

## 2015-06-20 DIAGNOSIS — F411 Generalized anxiety disorder: Secondary | ICD-10-CM | POA: Diagnosis not present

## 2015-06-20 DIAGNOSIS — G894 Chronic pain syndrome: Secondary | ICD-10-CM | POA: Diagnosis not present

## 2015-06-20 DIAGNOSIS — G4700 Insomnia, unspecified: Secondary | ICD-10-CM | POA: Diagnosis not present

## 2015-06-25 ENCOUNTER — Ambulatory Visit: Payer: PPO | Admitting: Allergy and Immunology

## 2015-07-01 ENCOUNTER — Other Ambulatory Visit: Payer: Self-pay | Admitting: Obstetrics & Gynecology

## 2015-07-01 ENCOUNTER — Telehealth: Payer: Self-pay | Admitting: Obstetrics & Gynecology

## 2015-07-03 MED ORDER — SOLIFENACIN SUCCINATE 10 MG PO TABS: 10.0000 mg | ORAL_TABLET | Freq: Every day | ORAL | Status: DC

## 2015-07-07 ENCOUNTER — Ambulatory Visit (INDEPENDENT_AMBULATORY_CARE_PROVIDER_SITE_OTHER): Payer: PPO | Admitting: Allergy and Immunology

## 2015-07-07 ENCOUNTER — Encounter: Payer: Self-pay | Admitting: Allergy and Immunology

## 2015-07-07 VITALS — BP 110/70 | HR 75 | Temp 97.4°F | Resp 16 | Ht 63.98 in | Wt 169.8 lb

## 2015-07-07 DIAGNOSIS — R059 Cough, unspecified: Secondary | ICD-10-CM | POA: Insufficient documentation

## 2015-07-07 DIAGNOSIS — J3089 Other allergic rhinitis: Secondary | ICD-10-CM

## 2015-07-07 DIAGNOSIS — R05 Cough: Secondary | ICD-10-CM

## 2015-07-07 DIAGNOSIS — R0600 Dyspnea, unspecified: Secondary | ICD-10-CM | POA: Insufficient documentation

## 2015-07-07 MED ORDER — AZELASTINE-FLUTICASONE 137-50 MCG/ACT NA SUSP: 1.0000 | Freq: Two times a day (BID) | NASAL | Status: DC

## 2015-07-07 MED ORDER — BUDESONIDE-FORMOTEROL FUMARATE 160-4.5 MCG/ACT IN AERO: 2.0000 | INHALATION_SPRAY | Freq: Two times a day (BID) | RESPIRATORY_TRACT | Status: DC

## 2015-07-07 NOTE — Patient Instructions (Addendum)
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  A prescription has been provided for Dymista (azelastine/fluticasone) nasal spray, 1 spray per nostril twice daily as needed. Proper nasal spray technique has been discussed and demonstrated.  Nasal saline lavage (NeilMed) as needed has been recommended along with instructions for proper administration.  Guaifenesin 1200 mg twice daily as needed with adequate hydration as discussed.   Coughing The most common causes of chronic cough include the following: upper airway cough syndrome (UACS) which is caused by variety of rhinosinus conditions; asthma; gastroesophageal reflux disease (GERD); chronic bronchitis from cigarette smoking or other inhaled environmental irritants; non-asthmatic eosinophilic bronchitis; and bronchiectasis. In prospective studies, these conditions have accounted for up to 94% of the causes of chronic cough in immunocompetent adults. The history and physical examination suggest that her cough is multifactorial with contribution from postnasal drainage, bronchial hyperresponsiveness, and history of cigarette smoking. We will address these issues at this time. We will regroup in 6 weeks to assess treatment response and adjust therapy accordingly.  Treatment plan as outlined above for allergic rhinosinusitis. A prescription has been provided for Symbicort (budesonide/formoterol) 160/4.5 g, 2 inhalations via spacer device twice a day.  To maximize pulmonary deposition, a spacer has been provided along with instructions for its proper administration with an HFA inhaler.  Given her family history a brother in his mid 4s diagnosed with COPD without a smoking history, we will check alpha-1 antitrypsin level.  Continued tobacco abstinence has been encouraged and secondhand cigarette smoke should be strictly eliminated from the  patient's environment.    Return in about 4 months (around 11/07/2015), or if symptoms worsen or fail to improve.  Control of Mold Allergen  Mold and fungi can grow on a variety of surfaces provided certain temperature and moisture conditions exist.  Outdoor molds grow on plants, decaying vegetation and soil.  The major outdoor mold, Alternaria and Cladosporium, are found in very high numbers during hot and dry conditions.  Generally, a late Summer - Fall peak is seen for common outdoor fungal spores.  Rain will temporarily lower outdoor mold spore count, but counts rise rapidly when the rainy period ends.  The most important indoor molds are Aspergillus and Penicillium.  Dark, humid and poorly ventilated basements are ideal sites for mold growth.  The next most common sites of mold growth are the bathroom and the kitchen.  Outdoor [employer] 2. Use air conditioning and keep windows closed 3. Avoid exposure to decaying vegetation. 4. Avoid leaf raking. 5. Avoid grain handling. 6. Consider wearing a face mask if working in moldy areas.  Indoor Mold Control 1. Maintain humidity below 50%. 2. Clean washable surfaces with 5% bleach solution. 3. Remove sources e.g. Contaminated carpets.  Control of Dog or Cat Allergen  Avoidance is the best way to manage a dog or cat allergy. If you have a dog or cat and are allergic to dog or cats, consider removing the dog or cat from the home. If you have a dog or cat but don't want to find it a new home, or if your family wants a pet even though someone in the household is allergic, here are some strategies that may help keep symptoms at bay:  1. Keep the pet out of your bedroom and restrict it to only a few rooms. Be advised that keeping the dog or cat in only one room will not limit the allergens to  that room. 2. Don't pet, hug or kiss the dog or cat; if you do, wash your hands with soap and water. 3. High-efficiency particulate air (HEPA) cleaners run  continuously in a bedroom or living room can reduce allergen levels over time. 4. Regular use of a high-efficiency vacuum cleaner or a central vacuum can reduce allergen levels. 5. Giving your dog or cat a bath at least once a week can reduce airborne allergen. Control of House Dust Mite Allergen  House dust mites play a major role in allergic asthma and rhinitis.  They occur in environments with high humidity wherever human skin, the food for dust mites is found. High levels have been detected in dust obtained from mattresses, pillows, carpets, upholstered furniture, bed covers, clothes and soft toys.  The principal allergen of the house dust mite is found in its feces.  A gram of dust may contain 1,000 mites and 250,000 fecal particles.  Mite antigen is easily measured in the air during house cleaning activities.    1. Encase mattresses, including the box spring, and pillow, in an air tight cover.  Seal the zipper end of the encased mattresses with wide adhesive tape. 2. Wash the bedding in water of 130 degrees Farenheit weekly.  Avoid cotton comforters/quilts and flannel bedding: the most ideal bed covering is the dacron comforter. 3. Remove all upholstered furniture from the bedroom. 4. Remove carpets, carpet padding, rugs, and non-washable window drapes from the bedroom.  Wash drapes weekly or use plastic window coverings. 5. Remove all non-washable stuffed toys from the bedroom.  Wash stuffed toys weekly. 6. Have the room cleaned frequently with a vacuum cleaner and a damp dust-mop.  The patient should not be in a room which is being cleaned and should wait 1 hour after cleaning before going into the room. 7. Close and seal all heating outlets in the bedroom.  Otherwise, the room will become filled with dust-laden air.  An electric heater can be used to heat the room. 8. Reduce indoor humidity to less than 50%.  Do not use a humidifier.

## 2015-07-07 NOTE — Progress Notes (Signed)
New Patient Note  RE: Jill Harrell MRN: [id number] DOB: 30-May-1960 Date of Office Visit: 07/07/2015  Referring provider: [name], MD Primary care provider: Odette Fraction, MD  Chief Complaint: Nasal Congestion and Headache   History of present illness: HPI Comments: Jill Harrell is a 55 y.o. female presenting today for consultation of rhinosinusitis.  She complains of nasal congestion, rhinorrhea "like [place]", sneezing, postnasal drainage, sinus pressure/pain/headaches, and ocular pruritus.  The headaches are located under her craniotomy scars and she feels pressure behind her right eye.  Fluticasone nasal spray has provided relief but she still experiences symptoms.  She has not perceived symptom reduction by over-the-counter antihistamines.  No significant seasonal symptom variation has been noted nor have specific environmental triggers been identified. She also complains of coughing and chest tightness in the morning.  She has a 15-pack-year history and reports that she quit smoking in January 2017.  She is exposed to secondhand cigarette smoke from family members.  She reports that she has a brother who was diagnosed with COPD in his 49s though he was a non-smoker.   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  A prescription has been provided for Dymista (azelastine/fluticasone) nasal spray, 1 spray per nostril twice daily as needed. Proper nasal spray technique has been discussed and demonstrated.  Nasal saline lavage (NeilMed) as needed has been recommended along with instructions for proper administration.  Guaifenesin 1200 mg twice daily as needed with adequate hydration as discussed.   Coughing The most common causes of chronic cough include the following: upper airway cough syndrome (UACS) which is caused by variety of  rhinosinus conditions; asthma; gastroesophageal reflux disease (GERD); chronic bronchitis from cigarette smoking or other inhaled environmental irritants; non-asthmatic eosinophilic bronchitis; and bronchiectasis. In prospective studies, these conditions have accounted for up to 94% of the causes of chronic cough in immunocompetent adults. The history and physical examination suggest that her cough is multifactorial with contribution from postnasal drainage, bronchial hyperresponsiveness, and history of cigarette smoking. We will address these issues at this time. We will regroup in 6 weeks to assess treatment response and adjust therapy accordingly.  Treatment plan as outlined above for allergic rhinosinusitis. A prescription has been provided for Symbicort (budesonide/formoterol) 160/4.5 g, 2 inhalations via spacer device twice a day.  To maximize pulmonary deposition, a spacer has been provided along with instructions for its proper administration with an HFA inhaler.  Given her family history a brother in his mid 77s diagnosed with COPD without a smoking history, we will check alpha-1 antitrypsin level.  Continued tobacco abstinence has been encouraged and secondhand cigarette smoke should be strictly eliminated from the patient's environment.    Meds ordered this encounter  Medications  . budesonide-formoterol (SYMBICORT) 160-4.5 MCG/ACT inhaler    Sig: Inhale 2 puffs into the lungs 2 (two) times daily.    Dispense:  1 Inhaler    Refill:  5  . Azelastine-Fluticasone 137-50 MCG/ACT SUSP    Sig: Place 1 spray into the nose 2 (two) times daily.    Dispense:  23 g    Refill:  5    Diagnositics: Spirometry: FVC was 1.80 L and FEV1 was 1.37 L (53% predicted) with 160 mL postbronchodilator improvement. Epicutaneous testing: Positive to dust mite antigen. Intradermal testing: Positive to molds and dog epithelia.    Physical examination: Blood pressure 110/70, pulse 75, temperature 97.4  F (  36.3 C), temperature source Oral, resp. rate 16, height 5' 3.98" (1.625 m), weight 169 lb 12.1 oz (77 kg).  General: Alert, interactive, in no acute distress. HEENT: TMs pearly gray, turbinates edematous with thick discharge, post-pharynx erythematous. Neck: Supple without lymphadenopathy. Lungs: Mildly decreased breath sounds bilaterally without wheezing, rhonchi or rales. CV: Normal S1, S2 without murmurs. Abdomen: Nondistended, nontender. Skin: Warm and dry, without lesions or rashes. Extremities:  No clubbing, cyanosis or edema. Neuro:   Grossly intact.  Review of systems:  Review of Systems  Constitutional: Negative for fever, chills and weight loss.  HENT: Positive for congestion. Negative for nosebleeds.   Eyes: Negative for blurred vision.  Respiratory: Positive for cough and shortness of breath. Negative for hemoptysis.   Cardiovascular: Negative for chest pain.  Gastrointestinal: Negative for diarrhea and constipation.  Genitourinary: Negative for dysuria.  Musculoskeletal: Negative for myalgias and joint pain.  Neurological: Positive for headaches. Negative for dizziness.  Endo/Heme/Allergies: Positive for environmental allergies. Does not bruise/bleed easily.    Past medical history:  Past Medical History  Diagnosis Date  . Anxiety   . Cancer ([place])   . 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 Bayard Hugger  . Seizures ([place])   . Arthritis     hip and wrist    Past surgical history:  Past Surgical History  Procedure Laterality Date  . Abdominal hysterectomy    . Cholecystectomy    . Brain surgery x2    . Appendectomy      Family history: Family History  Problem Relation Age of Onset  . Heart disease Father   . Hypotension Father   . Diabetes Father   . Allergic rhinitis Father   . Hyperlipidemia Maternal Grandfather   . Hypertension Maternal Grandfather   . Heart disease Maternal Grandfather   . Allergic rhinitis Maternal  Grandfather   . Allergic rhinitis Mother   . Allergic rhinitis Maternal Grandmother   . Asthma Brother   . Allergic rhinitis Brother   . Angioedema Neg Hx   . Atopy Neg Hx   . Eczema Neg Hx   . Immunodeficiency Neg Hx   . Urticaria Neg Hx   . Asthma Brother   . Allergic rhinitis Brother   . COPD Brother   . Allergic rhinitis Brother     Social history: Social History   Social History  . Marital Status: Married    Spouse Name: N/A  . Number of Children: N/A  . Years of Education: N/A   Occupational History  . Not on file.   Social History Main Topics  . Smoking status: Current Every Day Smoker -- 0.50 packs/day for 23 years    Types: Cigarettes  . Smokeless tobacco: Never Used  . Alcohol Use: No  . Drug Use: No  . Sexual Activity: Yes    Birth Control/ Protection: Other-see comments   Other Topics Concern  . Not on file   Social History Narrative   Environmental History: The patient lives in a house with carpeting in the bedroom and central air/heat.  There are 2 cats and a dog in house which have access to her bedroom.  She started smoking cigarettes in 1992, averaging half pack per day, and quit in January 2017. She is exposed to second hand cigarette smoke in the home.    Medication List       This list is accurate as of: 07/07/15  7:06 PM.  Always use your most  recent med list.               ALPRAZolam 1 MG tablet  Commonly known as:  XANAX  Take 1 tablet (1 mg total) by mouth 3 (three) times daily as needed for anxiety.     Azelastine-Fluticasone 137-50 MCG/ACT Susp  Place 1 spray into the nose 2 (two) times daily.     budesonide-formoterol 160-4.5 MCG/ACT inhaler  Commonly known as:  SYMBICORT  Inhale 2 puffs into the lungs 2 (two) times daily.     Calcium-Vitamin D 600-200 MG-UNIT tablet  Take by mouth.     CLARITIN-D 24 HOUR PO  Take by mouth.     divalproex 500 MG DR tablet  Commonly known as:  DEPAKOTE  Take 3 tablets (1,500 mg total) by  mouth at bedtime.     doxycycline 100 MG tablet  Commonly known as:  VIBRA-TABS  Take 1 tablet (100 mg total) by mouth 2 (two) times daily.     Estradiol 1 MG/GM Gel  Commonly known as:  Yoe 1 packet onto the skin daily.     Fish Oil 1000 MG Caps  Take by mouth.     fluconazole 150 MG tablet  Commonly known as:  DIFLUCAN  Take 1 tablet (150 mg total) by mouth once. Take the second tablet 3 days after the first one.     fluticasone 50 MCG/ACT nasal spray  Commonly known as:  FLONASE  Place 2 sprays into both nostrils daily.     HYDROcodone-acetaminophen 10-325 MG tablet  Commonly known as:  NORCO  Take 1 tablet by mouth every 6 (six) hours as needed (pain).     methadone 10 MG tablet  Commonly known as:  DOLOPHINE  Take 1 tablet (10 mg total) by mouth every 8 (eight) hours.     methylphenidate 10 MG tablet  Commonly known as:  RITALIN  Take 1 tablet (10 mg total) by mouth every morning.     multivitamin with minerals tablet  Take 1 tablet by mouth daily.     ondansetron 8 MG tablet  Commonly known as:  ZOFRAN  Take 1 tablet (8 mg total) by mouth every 8 (eight) hours as needed for nausea or vomiting.     oxybutynin 5 MG tablet  Commonly known as:  DITROPAN     polyethylene glycol powder powder  Commonly known as:  GLYCOLAX/MIRALAX  1 scoop daily or as needed     solifenacin 10 MG tablet  Commonly known as:  VESICARE  Take 1 tablet (10 mg total) by mouth daily.     zolpidem 5 MG tablet  Commonly known as:  AMBIEN  Take 1 tablet (5 mg total) by mouth at bedtime as needed for sleep.        Known medication allergies: Allergies  Allergen Reactions  . Contrast Media [Iodinated Diagnostic Agents] Nausea And Vomiting    I appreciate the opportunity to take part in this Sharilynn's care. Please do not hesitate to contact me with questions.  Sincerely,   R. Edgar Frisk, MD

## 2015-07-07 NOTE — Assessment & Plan Note (Addendum)
The most common causes of chronic cough include the following: upper airway cough syndrome (UACS) which is caused by variety of rhinosinus conditions; asthma; gastroesophageal reflux disease (GERD); chronic bronchitis from cigarette smoking or other inhaled environmental irritants; non-asthmatic eosinophilic bronchitis; and bronchiectasis. In prospective studies, these conditions have accounted for up to 94% of the causes of chronic cough in immunocompetent adults. The history and physical examination suggest that her cough is multifactorial with contribution from postnasal drainage, bronchial hyperresponsiveness, and history of cigarette smoking. We will address these issues at this time. We will regroup in 6 weeks to assess treatment response and adjust therapy accordingly.  Treatment plan as outlined above for allergic rhinosinusitis. A prescription has been provided for Symbicort (budesonide/formoterol) 160/4.5 g, 2 inhalations via spacer device twice a day.  To maximize pulmonary deposition, a spacer has been provided along with instructions for its proper administration with an HFA inhaler.  Given her family history a brother in his mid 48s diagnosed with COPD without a smoking history, we will check alpha-1 antitrypsin level.  Continued tobacco abstinence has been encouraged and secondhand cigarette smoke should be strictly eliminated from the patient's environment.

## 2015-07-07 NOTE — Assessment & Plan Note (Signed)
   Aeroallergen avoidance measures have been discussed and provided in written form.  A prescription has been provided for Dymista (azelastine/fluticasone) nasal spray, 1 spray per nostril twice daily as needed. Proper nasal spray technique has been discussed and demonstrated.  Nasal saline lavage (NeilMed) as needed has been recommended along with instructions for proper administration.  Guaifenesin 1200 mg twice daily as needed with adequate hydration as discussed.

## 2015-07-09 ENCOUNTER — Telehealth: Payer: Self-pay | Admitting: *Deleted

## 2015-07-10 ENCOUNTER — Encounter: Payer: Self-pay | Admitting: Allergy and Immunology

## 2015-07-10 NOTE — Telephone Encounter (Signed)
I e prescribed vesicare 1 week ago, if you look under meds you can see the date prescribed 5/4

## 2015-07-10 NOTE — Telephone Encounter (Signed)
Pt informed Vesicare e-scribed to pt pharmacy.

## 2015-07-17 ENCOUNTER — Telehealth (HOSPITAL_COMMUNITY): Payer: Self-pay | Admitting: Oncology

## 2015-07-17 NOTE — Telephone Encounter (Signed)
The certified letter that was sent to [place] on [id number] via certified mail was returned back to [place].  We will send the letter regular mail back to the patient today.  Jill Harrell 07/17/2015 5:37 PM

## 2015-07-21 DIAGNOSIS — G894 Chronic pain syndrome: Secondary | ICD-10-CM | POA: Diagnosis not present

## 2015-07-21 DIAGNOSIS — C711 Malignant neoplasm of frontal lobe: Secondary | ICD-10-CM | POA: Diagnosis not present

## 2015-07-21 DIAGNOSIS — F411 Generalized anxiety disorder: Secondary | ICD-10-CM | POA: Diagnosis not present

## 2015-07-21 DIAGNOSIS — G4700 Insomnia, unspecified: Secondary | ICD-10-CM | POA: Diagnosis not present

## 2015-07-23 ENCOUNTER — Ambulatory Visit (HOSPITAL_COMMUNITY): Payer: PPO

## 2015-07-23 ENCOUNTER — Ambulatory Visit (HOSPITAL_COMMUNITY): Payer: PPO | Admitting: Hematology & Oncology

## 2015-07-23 ENCOUNTER — Other Ambulatory Visit (HOSPITAL_COMMUNITY): Payer: PPO

## 2015-08-18 DIAGNOSIS — C711 Malignant neoplasm of frontal lobe: Secondary | ICD-10-CM | POA: Diagnosis not present

## 2015-08-18 DIAGNOSIS — G4700 Insomnia, unspecified: Secondary | ICD-10-CM | POA: Diagnosis not present

## 2015-08-18 DIAGNOSIS — G894 Chronic pain syndrome: Secondary | ICD-10-CM | POA: Diagnosis not present

## 2015-08-18 DIAGNOSIS — F411 Generalized anxiety disorder: Secondary | ICD-10-CM | POA: Diagnosis not present

## 2015-09-03 ENCOUNTER — Ambulatory Visit: Payer: PPO | Admitting: Obstetrics & Gynecology

## 2015-09-10 ENCOUNTER — Ambulatory Visit (HOSPITAL_COMMUNITY): Payer: PPO

## 2015-09-15 DIAGNOSIS — G4700 Insomnia, unspecified: Secondary | ICD-10-CM | POA: Diagnosis not present

## 2015-09-15 DIAGNOSIS — F411 Generalized anxiety disorder: Secondary | ICD-10-CM | POA: Diagnosis not present

## 2015-09-15 DIAGNOSIS — G894 Chronic pain syndrome: Secondary | ICD-10-CM | POA: Diagnosis not present

## 2015-09-15 DIAGNOSIS — C711 Malignant neoplasm of frontal lobe: Secondary | ICD-10-CM | POA: Diagnosis not present

## 2015-09-19 ENCOUNTER — Emergency Department (HOSPITAL_COMMUNITY)
Admission: EM | Admit: 2015-09-19 | Discharge: 2015-09-19 | Disposition: A | Discharge status: Home / Self Care | Payer: PPO | Attending: Emergency Medicine | Admitting: Emergency Medicine

## 2015-09-19 ENCOUNTER — Encounter (HOSPITAL_COMMUNITY): Payer: Self-pay | Admitting: Emergency Medicine

## 2015-09-19 DIAGNOSIS — R110 Nausea: Secondary | ICD-10-CM | POA: Diagnosis not present

## 2015-09-19 DIAGNOSIS — Z8669 Personal history of other diseases of the nervous system and sense organs: Secondary | ICD-10-CM | POA: Diagnosis not present

## 2015-09-19 DIAGNOSIS — Z85841 Personal history of malignant neoplasm of brain: Secondary | ICD-10-CM | POA: Insufficient documentation

## 2015-09-19 DIAGNOSIS — F17210 Nicotine dependence, cigarettes, uncomplicated: Secondary | ICD-10-CM | POA: Insufficient documentation

## 2015-09-19 DIAGNOSIS — G43009 Migraine without aura, not intractable, without status migrainosus: Secondary | ICD-10-CM | POA: Diagnosis not present

## 2015-09-19 DIAGNOSIS — M19039 Primary osteoarthritis, unspecified wrist: Secondary | ICD-10-CM | POA: Diagnosis not present

## 2015-09-19 DIAGNOSIS — M169 Osteoarthritis of hip, unspecified: Secondary | ICD-10-CM | POA: Diagnosis not present

## 2015-09-19 MED ORDER — DIPHENHYDRAMINE HCL 50 MG/ML IJ SOLN
25.0000 mg | Freq: Once | INTRAMUSCULAR | Status: AC
  Administered 2015-09-19: 25 mg via INTRAVENOUS
  Filled 2015-09-19: qty 1

## 2015-09-19 MED ORDER — METOCLOPRAMIDE HCL 5 MG/ML IJ SOLN
10.0000 mg | Freq: Once | INTRAMUSCULAR | Status: AC
  Administered 2015-09-19: 10 mg via INTRAVENOUS
  Filled 2015-09-19: qty 2

## 2015-09-19 MED ORDER — SODIUM CHLORIDE 0.9 % IV BOLUS (SEPSIS)
1000.0000 mL | Freq: Once | INTRAVENOUS | Status: AC
  Administered 2015-09-19: 1000 mL via INTRAVENOUS

## 2015-09-19 MED ORDER — DEXAMETHASONE SODIUM PHOSPHATE 10 MG/ML IJ SOLN
10.0000 mg | Freq: Once | INTRAMUSCULAR | Status: AC
  Administered 2015-09-19: 10 mg via INTRAVENOUS
  Filled 2015-09-19: qty 1

## 2015-09-19 NOTE — ED Provider Notes (Signed)
CSN: [id number]     Arrival date & time 09/19/15  0044 History   First MD Initiated Contact with Patient 09/19/15 0110 AM   Chief Complaint  Patient presents with  . Headache     (Consider location/radiation/quality/duration/timing/severity/associated sxs/prior Treatment) HPI patient states she used to have migraines for many years however she was diagnosed with a stage IV glioblastoma and is followed at [facility]. She states she is on methadone and hydrocodone for chronic pain. She states she gets headaches daily. She states she has had headaches 34 days in a row. She states they are on the top of the head on and on the right side of her head which is the usual location. She describes it as a pressure like she feels like her head is going to explode. She states she can feel her brain throbbing. She has nausea without vomiting. She has photophobia and noise sensitivity. She denies any numbness or tingling of her extremities. She states she called her brain specialist at [facility] and they told her to "come to the ED to get a shot". However when I reviewed the records that she talked to them on the 19th.  Review of the records shows she has a stable frontal glioblastoma multiforme documented on her last MRI on 02/27/2015.   PCP [name]  Past Medical History  Diagnosis Date  . Anxiety   . Cancer ([place])   . 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 Bayard Hugger  . Seizures ([place])   . Arthritis     hip and wrist   Past Surgical History  Procedure Laterality Date  . Abdominal hysterectomy    . Cholecystectomy    . Brain surgery x2    . Appendectomy     Family History  Problem Relation Age of Onset  . Heart disease Father   . Hypotension Father   . Diabetes Father   . Allergic rhinitis Father   . Hyperlipidemia Maternal Grandfather   . Hypertension Maternal Grandfather   . Heart disease Maternal Grandfather   . Allergic rhinitis Maternal Grandfather   . Allergic  rhinitis Mother   . Allergic rhinitis Maternal Grandmother   . Asthma Brother   . Allergic rhinitis Brother   . Angioedema Neg Hx   . Atopy Neg Hx   . Eczema Neg Hx   . Immunodeficiency Neg Hx   . Urticaria Neg Hx   . Asthma Brother   . Allergic rhinitis Brother   . COPD Brother   . Allergic rhinitis Brother    Social History  Substance Use Topics  . Smoking status: Current Every Day Smoker -- 0.50 packs/day for 23 years    Types: Cigarettes  . Smokeless tobacco: Never Used  . Alcohol Use: No   Lives at home Lives with spouse  OB History    Gravida Para Term Preterm AB TAB SAB Ectopic Multiple Living            0     Review of Systems  All other systems reviewed and are negative.     Allergies  Contrast media  Home Medications   Prior to Admission medications   Medication Sig Start Date End Date Taking? Authorizing Provider  ALPRAZolam [name]) 1 MG tablet Take 1 tablet (1 mg total) by mouth 3 (three) times daily as needed for anxiety. 05/23/15   Baird Cancer, PA-C  Azelastine-Fluticasone 137-50 MCG/ACT SUSP Place 1 spray into the nose 2 (two)  times daily. 07/07/15   Adelina Mings, MD  budesonide-formoterol [facility]) 160-4.5 MCG/ACT inhaler Inhale 2 puffs into the lungs 2 (two) times daily. 07/07/15   Adelina Mings, MD  Calcium-Vitamin D 600-200 MG-UNIT per tablet Take by mouth.    Historical Provider, MD  divalproex (DEPAKOTE) 500 MG DR tablet Take 3 tablets (1,500 mg total) by mouth at bedtime. 04/21/15   Patrici Ranks, MD  doxycycline (VIBRA-TABS) 100 MG tablet Take 1 tablet (100 mg total) by mouth 2 (two) times daily. 06/03/15   [name], MD  Estradiol (DIVIGEL) 1 MG/GM GEL Place 1 packet onto the skin daily. 06/03/15   [name], MD  fluconazole (DIFLUCAN) 150 MG tablet Take 1 tablet (150 mg total) by mouth once. Take the second tablet 3 days after the first one. Patient not taking: Reported on 07/07/2015 06/03/15   [name], MD   fluticasone [facility]) 50 MCG/ACT nasal spray Place 2 sprays into both nostrils daily. 04/25/15   [name], MD  HYDROcodone-acetaminophen (NORCO) 10-325 MG tablet Take 1 tablet by mouth every 6 (six) hours as needed (pain). 05/23/15   Baird Cancer, PA-C  Loratadine-Pseudoephedrine (CLARITIN-D 24 HOUR PO) Take by mouth.    Historical Provider, MD  methadone (DOLOPHINE) 10 MG tablet Take 1 tablet (10 mg total) by mouth every 8 (eight) hours. 06/10/15   Baird Cancer, PA-C  methylphenidate (RITALIN) 10 MG tablet Take 1 tablet (10 mg total) by mouth every morning. 04/18/15   [name], MD  Multiple Vitamins-Minerals (MULTIVITAMIN WITH MINERALS) tablet Take 1 tablet by mouth daily.    Historical Provider, MD  Omega-3 Fatty Acids (FISH OIL) 1000 MG CAPS Take by mouth.    Historical Provider, MD  ondansetron (ZOFRAN) 8 MG tablet Take 1 tablet (8 mg total) by mouth every 8 (eight) hours as needed for nausea or vomiting. 05/23/15   Baird Cancer, PA-C  oxybutynin (DITROPAN) 5 MG tablet  06/18/15   Historical Provider, MD  polyethylene glycol powder (GLYCOLAX/MIRALAX) powder 1 scoop daily or as needed 06/03/15   [name], MD  solifenacin (VESICARE) 10 MG tablet Take 1 tablet (10 mg total) by mouth daily. 07/03/15   [name], MD  zolpidem (AMBIEN) 5 MG tablet Take 1 tablet (5 mg total) by mouth at bedtime as needed for sleep. 05/23/15   Manon Hilding Kefalas, PA-C   BP 101/45 mmHg  Pulse 70  Temp(Src) 98 F (36.7 C)  Resp 20  Ht 5\' 4"  (1.626 m)  Wt 163 lb (73.936 kg)  BMI 27.97 kg/m2  SpO2 97%  Vital signs normal   Physical Exam  Constitutional: She is oriented to person, place, and time. She appears well-developed and well-nourished.  Non-toxic appearance. She does not appear ill. She appears distressed.  Patient is wearing sunglasses in a room with all the lights out  HENT:  Head: Normocephalic and atraumatic.  Right Ear: External ear normal.  Left Ear: External ear normal.   Nose: Nose normal. No mucosal edema or rhinorrhea.  Mouth/Throat: Oropharynx is clear and moist and mucous membranes are normal. No dental abscesses or uvula swelling.  Eyes: Conjunctivae and EOM are normal. Pupils are equal, round, and reactive to light.  Neck: Normal range of motion and full passive range of motion without pain. Neck supple.  Cardiovascular: Normal rate, regular rhythm and normal heart sounds.  Exam reveals no gallop and no friction rub.   No murmur heard. Pulmonary/Chest:  Effort normal and breath sounds normal. No respiratory distress. She has no wheezes. She has no rhonchi. She has no rales. She exhibits no tenderness and no crepitus.  Abdominal: Soft. Normal appearance and bowel sounds are normal. She exhibits no distension. There is no tenderness. There is no rebound and no guarding.  Musculoskeletal: Normal range of motion. She exhibits no edema or tenderness.  Moves all extremities well.   Neurological: She is alert and oriented to person, place, and time. She has normal strength. No cranial nerve deficit.  Skin: Skin is warm, dry and intact. No rash noted. No erythema. No pallor.  Psychiatric: She has a normal mood and affect. Her speech is normal and behavior is normal. Her mood appears not anxious.  Nursing note and vitals reviewed.   ED Course  Procedures (including critical care time)  Medications  sodium chloride 0.9 % bolus 1,000 mL (1,000 mLs Intravenous New Bag/Given 09/19/15 0141)  metoCLOPramide (REGLAN) injection 10 mg (10 mg Intravenous Given 09/19/15 0144)  diphenhydrAMINE (BENADRYL) injection 25 mg (25 mg Intravenous Given 09/19/15 0142)  dexamethasone (DECADRON) injection 10 mg (10 mg Intravenous Given 09/19/15 0147)   Patient seems to be expecting to get a narcotic injection, however discussed for her headache sounds like a migraine type headache. I am going to give her migraine cocktail.  Recheck at 3:30 AM, despite time did nurse she wasn't any  better shortly before I went back in the room to see her both her husband and patient states that her and she does not want any more pain medication. She states she feels like she can handle her headache at home now.    MDM   Final diagnoses:  Migraine without aura and without status migrainosus, not intractable    Plan discharge  [name], MD, Barbette Or, MD 09/19/15 [phone]

## 2015-09-19 NOTE — ED Notes (Signed)
Pt c/o headache with nausea x 3-4 days.

## 2015-09-19 NOTE — ED Notes (Signed)
Pt states she called her oncologist about her continued head aches. Was advised to go to the ER an get a shot. Pt says she is taking methadone & Vicodin.

## 2015-09-19 NOTE — Discharge Instructions (Signed)
Go home and rest. Follow up with your neurologist at [facility].   Return to the ED if you get numbness or weakness in your arms or legs, you have uncontrolled vomiting or your headache seems to be worse.

## 2015-09-19 NOTE — ED Notes (Signed)
Pt alert & oriented x4, stable gait. Patient given discharge instructions, paperwork & prescription(s). Patient verbalized understanding. Pt left department in wheelchair escorted by staff. Pt left w/ no further questions. 

## 2015-09-19 NOTE — ED Notes (Signed)
Family at bedside. Patient resting with mask over eyes and lights turned downed.

## 2015-09-24 ENCOUNTER — Ambulatory Visit (HOSPITAL_COMMUNITY)
Admission: RE | Admit: 2015-09-24 | Discharge: 2015-09-24 | Disposition: A | Discharge status: Home / Self Care | Payer: PPO | Source: Ambulatory Visit | Attending: Obstetrics & Gynecology | Admitting: Obstetrics & Gynecology

## 2015-09-24 DIAGNOSIS — Z1231 Encounter for screening mammogram for malignant neoplasm of breast: Secondary | ICD-10-CM | POA: Insufficient documentation

## 2015-09-24 DIAGNOSIS — R928 Other abnormal and inconclusive findings on diagnostic imaging of breast: Secondary | ICD-10-CM | POA: Insufficient documentation

## 2015-09-25 ENCOUNTER — Other Ambulatory Visit: Payer: Self-pay | Admitting: Obstetrics & Gynecology

## 2015-09-25 DIAGNOSIS — R928 Other abnormal and inconclusive findings on diagnostic imaging of breast: Secondary | ICD-10-CM

## 2015-10-14 ENCOUNTER — Encounter (HOSPITAL_COMMUNITY): Payer: PPO

## 2015-10-15 DIAGNOSIS — Z720 Tobacco use: Secondary | ICD-10-CM | POA: Diagnosis not present

## 2015-10-15 DIAGNOSIS — C711 Malignant neoplasm of frontal lobe: Secondary | ICD-10-CM | POA: Diagnosis not present

## 2015-10-15 DIAGNOSIS — F411 Generalized anxiety disorder: Secondary | ICD-10-CM | POA: Diagnosis not present

## 2015-10-15 DIAGNOSIS — G894 Chronic pain syndrome: Secondary | ICD-10-CM | POA: Diagnosis not present

## 2015-10-16 ENCOUNTER — Other Ambulatory Visit: Payer: PPO

## 2015-10-21 ENCOUNTER — Ambulatory Visit (HOSPITAL_COMMUNITY)
Admission: RE | Admit: 2015-10-21 | Discharge: 2015-10-21 | Disposition: A | Discharge status: Home / Self Care | Payer: PPO | Source: Ambulatory Visit | Attending: Obstetrics & Gynecology | Admitting: Obstetrics & Gynecology

## 2015-10-21 ENCOUNTER — Encounter (HOSPITAL_COMMUNITY): Payer: PPO

## 2015-10-21 DIAGNOSIS — N6002 Solitary cyst of left breast: Secondary | ICD-10-CM | POA: Diagnosis not present

## 2015-10-21 DIAGNOSIS — R928 Other abnormal and inconclusive findings on diagnostic imaging of breast: Secondary | ICD-10-CM

## 2015-10-21 DIAGNOSIS — R922 Inconclusive mammogram: Secondary | ICD-10-CM | POA: Diagnosis not present

## 2015-10-21 DIAGNOSIS — N6012 Diffuse cystic mastopathy of left breast: Secondary | ICD-10-CM | POA: Diagnosis not present

## 2015-10-24 ENCOUNTER — Telehealth: Payer: Self-pay | Admitting: Family Medicine

## 2015-10-24 NOTE — Telephone Encounter (Signed)
Pt called [facility] stating that she got a call about having a colonoscopy and she would like to know if she could do the stool test that you mail off and not have to do the actual colonoscopy?????

## 2015-10-27 NOTE — Telephone Encounter (Signed)
cologuard would be acceptable alternative.

## 2015-10-27 NOTE — Telephone Encounter (Signed)
Call placed to patient. No answer. No VM.    Cologuard ordered.

## 2015-10-28 ENCOUNTER — Encounter: Payer: Self-pay | Admitting: Family Medicine

## 2015-10-28 ENCOUNTER — Ambulatory Visit (INDEPENDENT_AMBULATORY_CARE_PROVIDER_SITE_OTHER): Payer: PPO | Admitting: Family Medicine

## 2015-10-28 VITALS — BP 112/72 | HR 82 | Temp 97.9°F | Resp 16 | Ht 64.0 in | Wt 164.0 lb

## 2015-10-28 DIAGNOSIS — H5712 Ocular pain, left eye: Secondary | ICD-10-CM

## 2015-10-28 DIAGNOSIS — H1032 Unspecified acute conjunctivitis, left eye: Secondary | ICD-10-CM | POA: Diagnosis not present

## 2015-10-28 NOTE — Progress Notes (Signed)
Subjective:    Patient ID: Jill Harrell, female    DOB: 06-Jun-1960, 55 y.o.   MRN: [id number]  HPI Patient reports sudden onset of pain in her left eye. The conjunctiva in the left eye is erythematous. She has extreme photosensitivity. She has loss of visual acuity. Today on examination she is 20/200 in the left eye. Fluoroscein exam reveals no dendrites no corneal abrasions no ulcers. Patient does have a contact in her left eye that has been in place according to the patient since March. Patient moans and screams when I move her upper eyelid. She keeps completely closed refuses to open it. Past Medical History:  Diagnosis Date  . Anxiety   . Arthritis    hip and wrist  . 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 Bayard Hugger  . Cancer ([place])   . Seizures ([place])    Past Surgical History:  Procedure Laterality Date  . ABDOMINAL HYSTERECTOMY    . APPENDECTOMY    . BRAIN SURGERY X2    . CHOLECYSTECTOMY     Current Outpatient Prescriptions on File Prior to Visit  Medication Sig Dispense Refill  . ALPRAZolam (XANAX) 1 MG tablet Take 1 tablet (1 mg total) by mouth 3 (three) times daily as needed for anxiety. 120 tablet 0  . Azelastine-Fluticasone 137-50 MCG/ACT SUSP Place 1 spray into the nose 2 (two) times daily. 23 g 5  . budesonide-formoterol (SYMBICORT) 160-4.5 MCG/ACT inhaler Inhale 2 puffs into the lungs 2 (two) times daily. 1 Inhaler 5  . Calcium-Vitamin D 600-200 MG-UNIT per tablet Take by mouth.    . divalproex (DEPAKOTE) 500 MG DR tablet Take 3 tablets (1,500 mg total) by mouth at bedtime. 90 tablet 3  . Estradiol (DIVIGEL) 1 MG/GM GEL Place 1 packet onto the skin daily. 30 g 11  . fluticasone (FLONASE) 50 MCG/ACT nasal spray Place 2 sprays into both nostrils daily. 16 g 6  . HYDROcodone-acetaminophen (NORCO) 10-325 MG tablet Take 1 tablet by mouth every 6 (six) hours as needed (pain). 120 tablet 0  . Loratadine-Pseudoephedrine (CLARITIN-D 24 HOUR PO) Take by  mouth.    . methadone (DOLOPHINE) 10 MG tablet Take 1 tablet (10 mg total) by mouth every 8 (eight) hours. 90 tablet 0  . methylphenidate (RITALIN) 10 MG tablet Take 1 tablet (10 mg total) by mouth every morning. 30 tablet 0  . Multiple Vitamins-Minerals (MULTIVITAMIN WITH MINERALS) tablet Take 1 tablet by mouth daily.    . Omega-3 Fatty Acids (FISH OIL) 1000 MG CAPS Take by mouth.    . ondansetron (ZOFRAN) 8 MG tablet Take 1 tablet (8 mg total) by mouth every 8 (eight) hours as needed for nausea or vomiting. 90 tablet 3  . oxybutynin (DITROPAN) 5 MG tablet     . polyethylene glycol powder (GLYCOLAX/MIRALAX) powder 1 scoop daily or as needed 255 g 11  . solifenacin (VESICARE) 10 MG tablet Take 1 tablet (10 mg total) by mouth daily. 30 tablet 11  . zolpidem (AMBIEN) 5 MG tablet Take 1 tablet (5 mg total) by mouth at bedtime as needed for sleep. 30 tablet 0   No current facility-administered medications on file prior to visit.    Allergies  Allergen Reactions  . Contrast Media [Iodinated Diagnostic Agents] Nausea And Vomiting   Social History   Social History  . Marital status: Married    Spouse name: N/A  . Number of children: N/A  . Years of education: N/A  Occupational History  . Not on file.   Social History Main Topics  . Smoking status: Current Every Day Smoker    Packs/day: 0.50    Years: 23.00    Types: Cigarettes  . Smokeless tobacco: Never Used  . Alcohol use No  . Drug use: No  . Sexual activity: Yes    Birth control/ protection: Other-see comments   Other Topics Concern  . Not on file   Social History Narrative  . No narrative on file      Review of Systems  All other systems reviewed and are negative.      Objective:   Physical Exam  Eyes: EOM are normal. Pupils are equal, round, and reactive to light. Lids are everted and swept, no foreign bodies found. Left eye exhibits no chemosis, no discharge, no exudate and no hordeolum. No foreign body present  in the left eye. Left conjunctiva is injected. No scleral icterus.  Vitals reviewed.         Assessment & Plan:  Given the sudden onset of a red painful eye, the photosensitivity, and the sudden loss of visual acuity, I am concerned about acute angle-closure glaucoma. I am paging the doctor on call for opthalmology.  Dr. Annamaria Boots has graciously agreed to see her right now. As he is on call for ophthalmology. She is en route to his office

## 2015-10-29 NOTE — Telephone Encounter (Signed)
Patient seen in office and made aware.

## 2015-11-11 DIAGNOSIS — Z1211 Encounter for screening for malignant neoplasm of colon: Secondary | ICD-10-CM | POA: Diagnosis not present

## 2015-11-11 DIAGNOSIS — Z1212 Encounter for screening for malignant neoplasm of rectum: Secondary | ICD-10-CM | POA: Diagnosis not present

## 2015-11-18 DIAGNOSIS — G4700 Insomnia, unspecified: Secondary | ICD-10-CM | POA: Diagnosis not present

## 2015-11-18 DIAGNOSIS — G894 Chronic pain syndrome: Secondary | ICD-10-CM | POA: Diagnosis not present

## 2015-11-18 DIAGNOSIS — F411 Generalized anxiety disorder: Secondary | ICD-10-CM | POA: Diagnosis not present

## 2015-11-18 DIAGNOSIS — C711 Malignant neoplasm of frontal lobe: Secondary | ICD-10-CM | POA: Diagnosis not present

## 2015-11-24 DIAGNOSIS — C711 Malignant neoplasm of frontal lobe: Secondary | ICD-10-CM | POA: Diagnosis not present

## 2015-12-01 ENCOUNTER — Ambulatory Visit (HOSPITAL_COMMUNITY)
Admission: RE | Admit: 2015-12-01 | Discharge: 2015-12-01 | Disposition: A | Discharge status: Home / Self Care | Payer: PPO | Source: Ambulatory Visit | Attending: Physician Assistant | Admitting: Physician Assistant

## 2015-12-01 ENCOUNTER — Encounter: Payer: Self-pay | Admitting: Physician Assistant

## 2015-12-01 ENCOUNTER — Other Ambulatory Visit: Payer: Self-pay | Admitting: Family Medicine

## 2015-12-01 ENCOUNTER — Ambulatory Visit (INDEPENDENT_AMBULATORY_CARE_PROVIDER_SITE_OTHER): Payer: PPO | Admitting: Physician Assistant

## 2015-12-01 VITALS — BP 108/78 | HR 67 | Temp 97.6°F | Resp 14 | Wt 162.0 lb

## 2015-12-01 DIAGNOSIS — R109 Unspecified abdominal pain: Secondary | ICD-10-CM | POA: Diagnosis not present

## 2015-12-01 DIAGNOSIS — R1084 Generalized abdominal pain: Secondary | ICD-10-CM

## 2015-12-01 LAB — COMPLETE METABOLIC PANEL WITH GFR
ALT: 8 U/L (ref 6–29)
AST: 12 U/L (ref 10–35)
Albumin: 4.3 g/dL (ref 3.6–5.1)
Alkaline Phosphatase: 58 U/L (ref 33–130)
BUN: 18 mg/dL (ref 7–25)
CO2: 29 mmol/L (ref 20–31)
Calcium: 9.5 mg/dL (ref 8.6–10.4)
Chloride: 105 mmol/L (ref 98–110)
Creat: 0.71 mg/dL (ref 0.50–1.05)
GFR, Est African American: >89 mL/min (ref >=60)
GFR, Est Non African American: >89 mL/min (ref >=60)
Glucose, Bld: 93 mg/dL (ref 70–99)
Potassium: 5.7 mmol/L — ABNORMAL HIGH (ref 3.5–5.3)
Sodium: 142 mmol/L (ref 135–146)
Total Bilirubin: 0.4 mg/dL (ref 0.2–1.2)
Total Protein: 7.1 g/dL (ref 6.1–8.1)

## 2015-12-01 LAB — CBC
HCT: 46.2 % — ABNORMAL HIGH (ref 35.0–45.0)
Hemoglobin: 15.3 g/dL — ABNORMAL HIGH (ref 12.0–15.0)
MCH: 31.6 pg (ref 27.0–33.0)
MCHC: 33.1 g/dL (ref 32.0–36.0)
MCV: 95.5 fL (ref 80.0–100.0)
Platelets: 228 10*3/uL (ref 140–400)
RBC: 4.84 MIL/uL (ref 3.80–5.10)
RDW: 13.7 % (ref 11.0–15.0)
WBC: 5.6 10*3/uL (ref 3.8–10.8)

## 2015-12-01 NOTE — Progress Notes (Signed)
Patient ID: Jill Harrell MRN: [id number], DOB: Jul 30, 1960, 55 y.o. Date of Encounter: @DATE @  Chief Complaint:  Chief Complaint  Patient presents with  . Abdominal Pain    HPI: 55 y.o. year old white female  presents with above.   She reports that she just went to [facility] on Monday, September 25 for follow-up and "the tumor in her brain has not grown". Says it is stable.   Says that this abdominal pain and cramping started Saturday-- 3 weeks ago.  Says that on that day her husband took her to [facility] Tuesday because she had been depressed and down and he thought that would help her feel better. She got a child's size steak and a loaded baked potato.  That night at 1 AM she woke with severe pain and cramping in her abdomen. Says that these symptoms have continued ever since then for 3 whole weeks now. Says it "feels like a cinder block inside-- going across her whole abdomen" and says "it cramps so bad " Says "at the same time that it cramps, it feels like a butcher knife is up my rectum" Says that she is starving hungry but every time she takes 3 or 4 bites then she develops pain and bloating/ swelling in her abdomen.  Says "feels like starving but then develops pain and bloats up like she is 6 months pregnant" Says that she can't even sleep more than 3-4 hours without this cramping pain. Has tried Alka-Seltzer then Prilosec and Zantac then Pepto-Bismol. Chicken noodle soup.  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  Asked her she ever been on a medicine called Movantik. She also mentions specifically Linzess--- She says that her GYN called and spoke to her doctor at [facility] that the they were concerned of  brain/seizures. Also says that she knows that with one of the medicines they considered when they checked with her insurance it was to cost her $200.  Even though she was already having those kinds of symptoms back in April--- she states that these current increased symptoms started 3 weeks ago.  Also notes that she has a family member who has history of Crohn's and also another family member with a history of colon cancer. She states that she has never seen a GI doctor herself. Says that her GYN did a Cologuard at her visit there in April. However says that she never her those results.   Past Medical History:  Diagnosis Date  . Anxiety   . Arthritis    hip and wrist  . 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 Bayard Hugger  . Cancer ([place])   . Seizures ([place])      Home Meds: Outpatient Medications Prior to Visit  Medication Sig Dispense Refill  . ALPRAZolam (XANAX) 1 MG tablet Take 1 tablet (1 mg total) by mouth 3 (three) times daily as needed for anxiety. 120 tablet 0  . Azelastine-Fluticasone 137-50 MCG/ACT SUSP Place 1 spray into the nose 2 (two) times daily. 23 g 5  . budesonide-formoterol (SYMBICORT) 160-4.5 MCG/ACT inhaler Inhale 2 puffs into the lungs 2 (two) times daily. 1 Inhaler 5  . Calcium-Vitamin D  600-200 MG-UNIT per tablet Take by mouth.    . divalproex (DEPAKOTE) 500 MG DR tablet Take 3 tablets (1,500 mg total) by mouth at bedtime. 90 tablet 3  . Estradiol (DIVIGEL) 1 MG/GM GEL Place 1 packet onto the skin daily. 30 g 11  . fluticasone (FLONASE) 50 MCG/ACT nasal spray Place 2 sprays into both nostrils daily. 16 g 6  . HYDROcodone-acetaminophen (NORCO) 10-325 MG tablet Take 1 tablet by mouth every 6 (six) hours as needed (pain). 120 tablet 0  . Loratadine-Pseudoephedrine (CLARITIN-D 24 HOUR PO) Take by mouth.    . methadone (DOLOPHINE) 10 MG tablet Take 1 tablet (10 mg total) by mouth every 8 (eight) hours. 90 tablet 0  . methylphenidate (RITALIN) 10 MG  tablet Take 1 tablet (10 mg total) by mouth every morning. 30 tablet 0  . Multiple Vitamins-Minerals (MULTIVITAMIN WITH MINERALS) tablet Take 1 tablet by mouth daily.    . Omega-3 Fatty Acids (FISH OIL) 1000 MG CAPS Take by mouth.    . ondansetron (ZOFRAN) 8 MG tablet Take 1 tablet (8 mg total) by mouth every 8 (eight) hours as needed for nausea or vomiting. 90 tablet 3  . oxybutynin (DITROPAN) 5 MG tablet     . polyethylene glycol powder (GLYCOLAX/MIRALAX) powder 1 scoop daily or as needed 255 g 11  . solifenacin (VESICARE) 10 MG tablet Take 1 tablet (10 mg total) by mouth daily. 30 tablet 11  . zolpidem (AMBIEN) 5 MG tablet Take 1 tablet (5 mg total) by mouth at bedtime as needed for sleep. 30 tablet 0   No facility-administered medications prior to visit.     Allergies:  Allergies  Allergen Reactions  . Contrast Media [Iodinated Diagnostic Agents] Nausea And Vomiting    Social History   Social History  . Marital status: Married    Spouse name: N/A  . Number of children: N/A  . Years of education: N/A   Occupational History  . Not on file.   Social History Main Topics  . Smoking status: Current Every Day Smoker    Packs/day: 0.50    Years: 23.00    Types: Cigarettes  . Smokeless tobacco: Never Used  . Alcohol use No  . Drug use: No  . Sexual activity: Yes    Birth control/ protection: Other-see comments   Other Topics Concern  . Not on file   Social History Narrative  . No narrative on file    Family History  Problem Relation Age of Onset  . Heart disease Father   . Hypotension Father   . Diabetes Father   . Allergic rhinitis Father   . Hyperlipidemia Maternal Grandfather   . Hypertension Maternal Grandfather   . Heart disease Maternal Grandfather   . Allergic rhinitis Maternal Grandfather   . Allergic rhinitis Mother   . Allergic rhinitis Maternal Grandmother   . Asthma Brother   . Allergic rhinitis Brother   . Angioedema Neg Hx   . Atopy Neg Hx   .  Eczema Neg Hx   . Immunodeficiency Neg Hx   . Urticaria Neg Hx   . Asthma Brother   . Allergic rhinitis Brother   . COPD Brother   . Allergic rhinitis Brother      Review of Systems:  See HPI for pertinent ROS. All other ROS negative.    Physical Exam: Blood pressure 108/78, pulse 67, temperature 97.6 F (36.4 C), temperature source Oral, resp. rate 14, weight 162 lb (73.5 kg)., Body mass index  is 27.81 kg/m. General: Appears in no acute distress. Neck: Supple. No thyromegaly. No lymphadenopathy. Lungs: Clear bilaterally to auscultation without wheezes, rales, or rhonchi. Breathing is unlabored. Heart: RRR with S1 S2. No murmurs, rubs, or gallops. Abdomen: Soft,non-distended with normoactive bowel sounds.  No obvious abdominal masses. Diffuse tenderness with palpation of entire abdomen. Each are I palpate causes her severe pain. There is no one focal area of increased pain/tenderness with palpation. Musculoskeletal:  Strength and tone normal for age. Extremities/Skin: Warm and dry.  Neuro: Alert and oriented X 3. Moves all extremities spontaneously. Gait is normal. CNII-XII grossly in tact. Psych:  Responds to questions appropriately with a normal affect.   Results for orders placed or performed in visit on 12/01/15  CBC  Result Value Ref Range   WBC 5.6 3.8 - 10.8 K/uL   RBC 4.84 3.80 - 5.10 MIL/uL   Hemoglobin 15.3 (H) 12.0 - 15.0 g/dL   HCT 46.2 (H) 35.0 - 45.0 %   MCV 95.5 80.0 - 100.0 fL   MCH 31.6 27.0 - 33.0 pg   MCHC 33.1 32.0 - 36.0 g/dL   RDW 13.7 11.0 - 15.0 %   Platelets 228 140 - 400 K/uL     ASSESSMENT AND PLAN:  55 y.o. year old female with  1. Generalized abdominal pain  CBC Done here while patient here. This is stable. Patient sent directly to CT scan. I will speak with patient once CT report available. If CT normal then will proceed with urgent referral to GI. Patient states that she has never seen any specific GI in the past. If CT does show  significant findings, then will manage accordingly.  - COMPLETE METABOLIC PANEL WITH GFR - CBC - CT Abdomen Pelvis W Contrast; Future - Ambulatory referral to Gastroenterology  2. Abdominal cramping - COMPLETE METABOLIC PANEL WITH GFR - CBC - CT Abdomen Pelvis W Contrast; Future - Ambulatory referral to Gastroenterology   Signed, Olean Ree [place], [place], [facility] 12/01/2015 12:03 PM

## 2015-12-02 ENCOUNTER — Encounter (HOSPITAL_COMMUNITY): Payer: Self-pay

## 2015-12-02 ENCOUNTER — Ambulatory Visit (HOSPITAL_COMMUNITY): Payer: PPO

## 2015-12-03 ENCOUNTER — Encounter: Payer: Self-pay | Admitting: Internal Medicine

## 2015-12-05 ENCOUNTER — Other Ambulatory Visit (HOSPITAL_COMMUNITY): Payer: Self-pay | Admitting: Hematology & Oncology

## 2015-12-05 ENCOUNTER — Ambulatory Visit (HOSPITAL_COMMUNITY)
Admission: RE | Admit: 2015-12-05 | Discharge: 2015-12-05 | Disposition: A | Discharge status: Home / Self Care | Payer: PPO | Source: Ambulatory Visit | Attending: Physician Assistant | Admitting: Physician Assistant

## 2015-12-05 DIAGNOSIS — Z90710 Acquired absence of both cervix and uterus: Secondary | ICD-10-CM | POA: Diagnosis not present

## 2015-12-05 DIAGNOSIS — I700 Atherosclerosis of aorta: Secondary | ICD-10-CM | POA: Diagnosis not present

## 2015-12-05 DIAGNOSIS — R109 Unspecified abdominal pain: Secondary | ICD-10-CM | POA: Diagnosis not present

## 2015-12-05 DIAGNOSIS — R195 Other fecal abnormalities: Secondary | ICD-10-CM | POA: Diagnosis not present

## 2015-12-05 DIAGNOSIS — R1084 Generalized abdominal pain: Secondary | ICD-10-CM | POA: Insufficient documentation

## 2015-12-09 ENCOUNTER — Telehealth: Payer: Self-pay | Admitting: *Deleted

## 2015-12-10 ENCOUNTER — Encounter: Payer: Self-pay | Admitting: Obstetrics & Gynecology

## 2015-12-10 NOTE — Telephone Encounter (Signed)
Pt has mycharted Dr. Elonda Husky with her questions and concerns.

## 2015-12-11 DIAGNOSIS — I700 Atherosclerosis of aorta: Secondary | ICD-10-CM | POA: Insufficient documentation

## 2015-12-16 DIAGNOSIS — F411 Generalized anxiety disorder: Secondary | ICD-10-CM | POA: Diagnosis not present

## 2015-12-16 DIAGNOSIS — G894 Chronic pain syndrome: Secondary | ICD-10-CM | POA: Diagnosis not present

## 2015-12-16 DIAGNOSIS — G4700 Insomnia, unspecified: Secondary | ICD-10-CM | POA: Diagnosis not present

## 2015-12-16 DIAGNOSIS — Z23 Encounter for immunization: Secondary | ICD-10-CM | POA: Diagnosis not present

## 2015-12-22 ENCOUNTER — Ambulatory Visit: Payer: PPO | Admitting: Gastroenterology

## 2015-12-24 ENCOUNTER — Telehealth: Payer: Self-pay | Admitting: *Deleted

## 2015-12-24 ENCOUNTER — Encounter: Payer: Self-pay | Admitting: Physician Assistant

## 2015-12-24 ENCOUNTER — Telehealth: Payer: Self-pay

## 2015-12-24 ENCOUNTER — Ambulatory Visit (INDEPENDENT_AMBULATORY_CARE_PROVIDER_SITE_OTHER): Payer: PPO | Admitting: Physician Assistant

## 2015-12-24 VITALS — BP 106/78 | HR 68 | Temp 97.6°F | Resp 16 | Ht 64.0 in | Wt 158.0 lb

## 2015-12-24 DIAGNOSIS — J309 Allergic rhinitis, unspecified: Secondary | ICD-10-CM | POA: Diagnosis not present

## 2015-12-24 DIAGNOSIS — J0100 Acute maxillary sinusitis, unspecified: Secondary | ICD-10-CM | POA: Diagnosis not present

## 2015-12-24 MED ORDER — METHYLPHENIDATE HCL 10 MG PO TABS: 10.0000 mg | ORAL_TABLET | ORAL | 0 refills | Status: DC

## 2015-12-24 MED ORDER — PREDNISONE 20 MG PO TABS: ORAL_TABLET | ORAL | 0 refills | Status: DC

## 2015-12-24 NOTE — Addendum Note (Signed)
Addended by: Dena Billet on: 12/24/2015 12:43 PM   Modules accepted: Orders

## 2015-12-24 NOTE — Progress Notes (Addendum)
Patient ID: Jill Harrell MRN: [id number], DOB: 12/29/1960, 55 y.o. Date of Encounter: 12/24/2015, 12:35 PM    Chief Complaint:  Chief Complaint  Patient presents with  . Sinusitis  . Tinnitus     HPI: 55 y.o. year old female presents with above.   Regarding her brain tumor she says that she went September 25 and there had been no increase. Everything was stable. Says that she will go to [facility] for brain MRI every 9 months and will see the oncologist once a month.  Says that for the past 9 days she has been having pressure and popping and clicking in her maxillary sinuses bilaterally. Hearing ringing in her ears. Says that what is coming out of her nose is watery clear. There has been no thick dark mucus.     Home Meds:   Outpatient Medications Prior to Visit  Medication Sig Dispense Refill  . ALPRAZolam (XANAX) 1 MG tablet Take 1 tablet (1 mg total) by mouth 3 (three) times daily as needed for anxiety. 120 tablet 0  . Calcium-Vitamin D 600-200 MG-UNIT per tablet Take by mouth.    . divalproex (DEPAKOTE) 500 MG DR tablet Take 3 tablets (1,500 mg total) by mouth at bedtime. 90 tablet 3  . Estradiol (DIVIGEL) 1 MG/GM GEL Place 1 packet onto the skin daily. 30 g 11  . HYDROcodone-acetaminophen (NORCO) 10-325 MG tablet Take 1 tablet by mouth every 6 (six) hours as needed (pain). 120 tablet 0  . methadone (DOLOPHINE) 10 MG tablet Take 1 tablet (10 mg total) by mouth every 8 (eight) hours. 90 tablet 0  . Multiple Vitamins-Minerals (MULTIVITAMIN WITH MINERALS) tablet Take 1 tablet by mouth daily.    . Omega-3 Fatty Acids (FISH OIL) 1000 MG CAPS Take by mouth.    . ondansetron (ZOFRAN) 8 MG tablet Take 1 tablet (8 mg total) by mouth every 8 (eight) hours as needed for nausea or vomiting. 90 tablet 3  . oxybutynin (DITROPAN) 5 MG tablet     . polyethylene glycol powder (GLYCOLAX/MIRALAX) powder 1 scoop daily or as needed 255 g 11  . zolpidem (AMBIEN) 5 MG tablet Take 1 tablet (5 mg  total) by mouth at bedtime as needed for sleep. 30 tablet 0  . methylphenidate (RITALIN) 10 MG tablet Take 1 tablet (10 mg total) by mouth every morning. (Patient not taking: Reported on 12/24/2015) 30 tablet 0  . solifenacin (VESICARE) 10 MG tablet Take 1 tablet (10 mg total) by mouth daily. (Patient not taking: Reported on 12/24/2015) 30 tablet 11  . Azelastine-Fluticasone 137-50 MCG/ACT SUSP Place 1 spray into the nose 2 (two) times daily. (Patient not taking: Reported on 12/24/2015) 23 g 5  . budesonide-formoterol (SYMBICORT) 160-4.5 MCG/ACT inhaler Inhale 2 puffs into the lungs 2 (two) times daily. (Patient not taking: Reported on 12/24/2015) 1 Inhaler 5  . fluticasone (FLONASE) 50 MCG/ACT nasal spray Place 2 sprays into both nostrils daily. (Patient not taking: Reported on 12/24/2015) 16 g 6  . Loratadine-Pseudoephedrine (CLARITIN-D 24 HOUR PO) Take by mouth.     No facility-administered medications prior to visit.     Allergies:  Allergies  Allergen Reactions  . Contrast Media [Iodinated Diagnostic Agents] Nausea And Vomiting    Only makes her sick when taking chemo therapy.  Has had contrast w/o chemo and has been fine.      Review of Systems: See HPI for pertinent ROS. All other ROS negative.    Physical Exam: Blood pressure 106/78, pulse  68, temperature 97.6 F (36.4 C), temperature source Oral, resp. rate 16, height 5\' 4"  (1.626 m), weight 158 lb (71.7 kg), SpO2 98 %., Body mass index is 27.12 kg/m. General: WNWD WF  Appears in no acute distress. HEENT: Normocephalic, atraumatic, eyes without discharge, sclera non-icteric, nares are without discharge. Bilateral auditory canals clear, TM's are without perforation, pearly grey and translucent with reflective cone of light bilaterally. Oral cavity moist, posterior pharynx without exudate, erythema, peritonsillar abscess. She has tenderness with percussion to maxillary sinuses bilaterally.  Neck: Supple. No thyromegaly. No  lymphadenopathy. Lungs: Clear bilaterally to auscultation without wheezes, rales, or rhonchi. Breathing is unlabored. Heart: Regular rhythm. No murmurs, rubs, or gallops. Msk:  Strength and tone normal for age. Extremities/Skin: Warm and dry. Neuro: Alert and oriented X 3. Moves all extremities spontaneously. Gait is normal. Psych:  Responds to questions appropriately with a normal affect.     ASSESSMENT AND PLAN:  55 y.o. year old female with  1. Acute maxillary sinusitis, recurrence not specified She is to take the prednisone taper as directed. Follow-up if symptoms do not improve after completion of this. - predniSONE (DELTASONE) 20 MG tablet; Take 3 daily for 2 days, then 2 daily for 2 days, then 1 daily for 2 days.  Dispense: 12 tablet; Refill: 0  2. Acute allergic rhinitis, unspecified seasonality, unspecified trigger She is to take the prednisone taper as directed. Follow-up if symptoms do not improve after completion of this. - predniSONE (DELTASONE) 20 MG tablet; Take 3 daily for 2 days, then 2 daily for 2 days, then 1 daily for 2 days.  Dispense: 12 tablet; Refill: 0  During the visit, she made no mention of Ritalin, etc.  After she got up to check out counter, she asked if we were going to refill her Ritalin.  Rx printed, signed. It is low dose -- 10mg  once a day---  Signed, Karis Juba, [place], [facility] 12/24/2015 12:35 PM

## 2015-12-24 NOTE — Telephone Encounter (Signed)
Pt is req Rx for cymbalta

## 2015-12-25 NOTE — Telephone Encounter (Signed)
This medication is not on her medication list. I note that she had an office visit yesterday but that was for congestion and sinus issues. She would have to return for an office visit to discuss this.

## 2015-12-26 ENCOUNTER — Other Ambulatory Visit: Payer: Self-pay

## 2015-12-26 NOTE — Telephone Encounter (Signed)
PA needed for ritalin

## 2015-12-29 ENCOUNTER — Encounter: Payer: Self-pay | Admitting: Family Medicine

## 2015-12-29 ENCOUNTER — Ambulatory Visit (INDEPENDENT_AMBULATORY_CARE_PROVIDER_SITE_OTHER): Payer: PPO | Admitting: Family Medicine

## 2015-12-29 VITALS — BP 118/72 | HR 72 | Temp 98.3°F | Resp 18 | Wt 160.0 lb

## 2015-12-29 DIAGNOSIS — F411 Generalized anxiety disorder: Secondary | ICD-10-CM

## 2015-12-29 MED ORDER — DULOXETINE HCL 60 MG PO CPEP: 60.0000 mg | ORAL_CAPSULE | Freq: Every day | ORAL | 3 refills | Status: DC

## 2015-12-29 MED ORDER — METHYLPHENIDATE HCL 10 MG PO TABS: 10.0000 mg | ORAL_TABLET | ORAL | 0 refills | Status: DC

## 2015-12-29 NOTE — Telephone Encounter (Signed)
Tried calling Pt to let her know she needed to sch [facility] to call bck

## 2015-12-29 NOTE — Progress Notes (Signed)
Subjective:    Patient ID: Jill Harrell, female    DOB: 1960-11-14, 55 y.o.   MRN: [id number]  HPI 05/06/14 Patient is a very pleasant 55 year old white female who is here today to establish care. She has a very complicated past medical history. 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 She underwent surgical resection on 2 separate occasions as well as chemotherapy. Patient is stage IV and incurable. However she has been in stable condition for more than 6 years.  Initially she was only given 6 months to live but that is more than 8 years ago. Due to the surgery, the patient does have a history of seizure disorder. She takes Depakote for seizure prevention. She has not had seizures in several years. She is on methadone and Norco for chronic pain. This is prescribed by her oncologist at [facility]. She also has generalized anxiety disorder for which she takes Xanax 3 times a day as well as Ambien to help her sleep. She has never taken an SSRI for prevention of her anxiety. At the present time she is not interested in starting one of these medication. Her biggest concern is a sinus infection. She reports pain in her frontal and maxillary sinuses that has been there since February. She reports postnasal drip and headaches and fever. She is taking Levaquin without relief. She is tried Flonase without relief. She is tried Allegra-D without relief.  Patient already has a colonoscopy scheduled at [facility] along with a Pap smear and mammogram. She does not need me to schedule these things. Because her cancer has been stable for so many years she is proceeding with regular cancer screening. She is overdue for a pneumonia vaccine. Her flu shot is up-to-date. Her lab work is checked every month at the cancer center.  At that time, my plan was:  Begin Augmentin 875 mg by mouth twice a day for 10 days. Also gave the patient prescription for Diflucan to be used if needed for yeast infection. If  this does not help, the patient would probably benefit from a steroid taper pack however I would want to run this by her physician at [facility] prior to giving the patient's steroids having just met her. Patient also received Pneumovax 23 today. I will give her Prevnar 13 in one year. The remainder of her lab work in her cancer screening is up-to-date and being managed at [facility]. Therefore I'll be glad to see the patient annually and as needed.  06/24/14 Interested in taking an antidepressant medication. Patient reports depression. She reports anhedonia. She reports decreased energy and she denies suicidal ideation. She does have frequent severe panic attacks for which she has to take Xanax. She denies any symptoms of bipolar or mania. She is taking Prozac in the past and had side effects from the Prozac. She would like an antidepressant medication that may help give her more energy and may also help combat hot flashes.  At that time, my plan was: We will start the patient on Effexor XR 37.5 mg by mouth every morning. In one week increase to 75 mg by mouth daily. If the patient is tolerating that well in a month we will continue the dose we could even consider increasing to help decrease the frequency of panic attacks.  08/02/14 Patient stopped effexor after 4 days due to intractable nausea.  The patient believes she may have fibromyalgia. She states the last several months she is developed pain and stiffness in  her neck and her shoulders and her hips and in her thighs. She denies any injuries to these areas. She also has chronic fatigue. Internet research has had low at her to a conclusion that she may have fibromyalgia. However she also admits that she does not sleep well at night. She will sleep only 2-3 hours at a time. She then finds that she sleeps all day long. She never feels rested. She has no energy. She can sleep all day and when she is awake she feels lightheaded and "drunk "or even "hung over. Of note she  does take her methadone in the morning. She also uses hydrocodone throughout the day for breakthrough pain in addition to Xanax. She is also on Depakote for seizure prophylaxis. I explained to the patient that some of her symptoms may be from overmedication and she says that it is possible. She agrees that it is also possible that the reason she is not sleeping well at night is because she is sleeping so much during the day and because of her lack of physical exercise throughout the day. She is also interested in using Chantix for smoking cessation.  AT that time, my plan was: Fibromyalgia is certainly a possibility. However I believe most likely cause of her symptoms is fatigued secondary to polypharmacy and overmedication or medication side effect.  I believe that the patient was more alert during the day and more active during the day, she will sleep better at night. I then believe that some of the muscle tension she is developed in her neck and shoulders and legs would improve after she was resting better and more consistently at night. Differential diagnosis includes hypersomnolence due to obstructive sleep apnea, narcolepsy, fibromyalgia, Lyme disease. I will give the patient Chantix to help with smoking cessation. I have suggested trying the patient on a stimulant such as Provigil in the morning to increase her alertness during the day such that she can increase her activity during the day and then see if this improves her sleeping patterns at night. I hesitate to give her even more sedative medications for insomnia or muscle relaxers given her medication list at the present time. Patient is willing to try this but she would like me to discuss it with her neurosurgeon Jill Harrell (sp?) at [facility] first.    12/29/15 Ultimately, the patient was started on methylphenidate extended release for her chronic fatigue. This seemed to help. She continues Xanax 3 times a day for anxiety. She states that she has  never taken an SSRI for depression. Recently her oncologist at [facility] recommended that she wean gradually off benzodiazepines and pain medication given the fact that she is 9 years out from surgery and her life expectancy continues to improve. They recommended that she discuss with me more appropriately treating her anxiety as they begin to wean her off some of her pain medication.  Past Medical History:  Diagnosis Date  . Anxiety   . Arthritis    hip and wrist  . Brain cancer ([place])    2009 glioblastoma (stage 4) Dr. Bayard Hugger  . Cancer ([place])   . Seizures ([place])    Past Surgical History:  Procedure Laterality Date  . ABDOMINAL HYSTERECTOMY    . APPENDECTOMY    . BRAIN SURGERY X2    . CHOLECYSTECTOMY     Current Outpatient Prescriptions on File Prior to Visit  Medication Sig Dispense Refill  . ALPRAZolam (XANAX) 1 MG tablet Take 1 tablet (1 mg  total) by mouth 3 (three) times daily as needed for anxiety. 120 tablet 0  . Calcium-Vitamin D 600-200 MG-UNIT per tablet Take by mouth.    . divalproex (DEPAKOTE) 500 MG DR tablet Take 3 tablets (1,500 mg total) by mouth at bedtime. 90 tablet 3  . Estradiol (DIVIGEL) 1 MG/GM GEL Place 1 packet onto the skin daily. 30 g 11  . HYDROcodone-acetaminophen (NORCO) 10-325 MG tablet Take 1 tablet by mouth every 6 (six) hours as needed (pain). 120 tablet 0  . methadone (DOLOPHINE) 10 MG tablet Take 1 tablet (10 mg total) by mouth every 8 (eight) hours. 90 tablet 0  . Multiple Vitamins-Minerals (MULTIVITAMIN WITH MINERALS) tablet Take 1 tablet by mouth daily.    . Omega-3 Fatty Acids (FISH OIL) 1000 MG CAPS Take by mouth.    . ondansetron (ZOFRAN) 8 MG tablet Take 1 tablet (8 mg total) by mouth every 8 (eight) hours as needed for nausea or vomiting. 90 tablet 3  . oxybutynin (DITROPAN) 5 MG tablet     . polyethylene glycol powder (GLYCOLAX/MIRALAX) powder 1 scoop daily or as needed 255 g 11  . predniSONE (DELTASONE) 20 MG tablet Take 3 daily for 2 days,  then 2 daily for 2 days, then 1 daily for 2 days. 12 tablet 0  . solifenacin (VESICARE) 10 MG tablet Take 1 tablet (10 mg total) by mouth daily. 30 tablet 11  . zolpidem (AMBIEN) 5 MG tablet Take 1 tablet (5 mg total) by mouth at bedtime as needed for sleep. 30 tablet 0   No current facility-administered medications on file prior to visit.    Allergies  Allergen Reactions  . Contrast Media [Iodinated Diagnostic Agents] Nausea And Vomiting    Only makes her sick when taking chemo therapy.  Has had contrast w/o chemo and has been fine.   Social History   Social History  . Marital status: Married    Spouse name: N/A  . Number of children: N/A  . Years of education: N/A   Occupational History  . Not on file.   Social History Main Topics  . Smoking status: Current Every Day Smoker    Packs/day: 0.50    Years: 23.00    Types: Cigarettes  . Smokeless tobacco: Never Used  . Alcohol use No  . Drug use: No  . Sexual activity: Yes    Birth control/ protection: Other-see comments   Other Topics Concern  . Not on file   Social History Narrative  . No narrative on file      Review of Systems  All other systems reviewed and are negative.      Objective:   Physical Exam  Constitutional: She is oriented to person, place, and time. She appears well-developed and well-nourished.  Cardiovascular: Normal rate, regular rhythm and normal heart sounds.   Pulmonary/Chest: Effort normal and breath sounds normal.  Neurological: She is alert and oriented to person, place, and time. She has normal reflexes. No cranial nerve deficit. She exhibits normal muscle tone. Coordination normal.  Vitals reviewed.         Assessment & Plan:  GAD (generalized anxiety disorder) - Plan: DULoxetine (CYMBALTA) 60 MG capsule I suggested that we try Cymbalta 60 mg a day for generalized anxiety disorder. Wean down Xanax gradually from 3 pills a day to 2 pills a day eventually to 1 pill a day and after  4 weeks I would l like her only taking a half a pill a day. Once  the Cymbalta has taken full effect in 4 weeks, I will the patient to discontinue Xanax. Reassess the patient in one month. Ultimately once we have the patient off Xanax and possibly some of her pain medication, we may be able to wean her off methylphenidate as I believe the majority of her chronic fatigue is secondary to her use of opiates and benzodiazepines. I discussed this with the patient and she is in agreement to try.

## 2015-12-30 NOTE — Telephone Encounter (Signed)
Pt requesting the results from the "Cologuard." Please advise.

## 2015-12-30 NOTE — Telephone Encounter (Signed)
That test report has not come to me as of yet, it is not in EPIC that I can see,   And it may not since it is a specialty.  She probably would do better to call the physician who ordered it originally

## 2015-12-31 NOTE — Telephone Encounter (Signed)
Pt informed it did not appear that Dr. Elonda Husky ordered the cologuard, Pt will need to f/u with her PCP. Pt verbalized understanding.

## 2016-01-01 ENCOUNTER — Telehealth: Payer: Self-pay | Admitting: Family Medicine

## 2016-01-01 NOTE — Telephone Encounter (Signed)
PA for Ritalin has been submitted thru [facility] case # [id number]. Outcome pending

## 2016-01-07 ENCOUNTER — Telehealth: Payer: Self-pay | Admitting: Family Medicine

## 2016-01-07 ENCOUNTER — Ambulatory Visit (INDEPENDENT_AMBULATORY_CARE_PROVIDER_SITE_OTHER): Payer: PPO | Admitting: Gastroenterology

## 2016-01-07 ENCOUNTER — Encounter: Payer: Self-pay | Admitting: Gastroenterology

## 2016-01-07 VITALS — BP 98/64 | HR 90 | Temp 97.8°F | Ht 64.0 in | Wt 156.6 lb

## 2016-01-07 DIAGNOSIS — R1033 Periumbilical pain: Secondary | ICD-10-CM | POA: Diagnosis not present

## 2016-01-07 DIAGNOSIS — K5900 Constipation, unspecified: Secondary | ICD-10-CM | POA: Insufficient documentation

## 2016-01-07 DIAGNOSIS — K5903 Drug induced constipation: Secondary | ICD-10-CM | POA: Diagnosis not present

## 2016-01-07 MED ORDER — LUBIPROSTONE 24 MCG PO CAPS
24.0000 ug | ORAL_CAPSULE | Freq: Two times a day (BID) | ORAL | 5 refills | Status: DC
Start: 2016-01-07 — End: 2017-05-12

## 2016-01-07 NOTE — Patient Instructions (Addendum)
1. Stopped MiraLAX, both the name brand and the generic powder you have been using. 2. Start Amitiza 24 g twice daily with food. Prescription sent to pharmacy. Samples provided as well. 3. Let me know if you do not find out who ordered the Cologuard test for you. You can call me at [phone].

## 2016-01-07 NOTE — Progress Notes (Signed)
Primary Care Physician:  Odette Fraction, MD  Primary Gastroenterologist:  Garfield Cornea, MD   Chief Complaint  Patient presents with  . Constipation  . Abdominal Pain    across mid abd, feels like knife going through rectum to belly button  . Bloated    with 1 hr after eating    HPI:  Jill Harrell is a 55 y.o. female here at the request of her PCP for further evaluation of abdominal pain/bloating, constipation. Patient has significant history of stage IV glioblastoma blastoma originally diagnosed in 2009. Initial surgery at that time followed by recurrence 18 months later requiring additional surgery. Patient states she has persistent tumor which is being followed and has been very stable over the last 7 years. She reports initially she was given 6 months to 1 year to live. Given her prior surgery she does have chronic headaches, history of seizures, has been on chronic pain medication for years. She reports that she is in the process of trying to wean herself off some of these medications given that she has outlived her previous prognosis for multiple years.  Patient reports one year history of postprandial abdominal bloating. Abdomen becomes hard. Doesn't matter what she eats or how much. Symptoms associated with constipation. Describes a lot of straining to have a bowel movement. Develops pain, knifelike, originating in the rectal area and radiating up to the umbilicus and then radiates outward bilaterally. This pain worsens the longer she goes without a bowel movement. Pain does not usually involve having a bowel movement afterwards. Denies melena, rectal bleeding. No unintentional weight loss. Currently walks 2 miles daily and has lost about 6 pounds in the past 2 months. She reports being on pain medication since 2008. Started having issues with constipation about a year ago. Taking MiraLAX one dose daily alternating with 2 doses daily. Not helping. Previously failed Dulcolax, milk of  magnesia, magnesium citrate, Phillip stool softener all within the last year.   She states she completed Cologuard but has not received her results.. Not sure which doctor ordered it for her. She has contacted Dr. Brynda Greathouse office and they report that they didn't order it. She is waiting to hear back from her PCP. Patient states she has short-term memory loss related to her history of brain tumor status post resection twice. Patient states she chose Cologuard because she was afraid that she would not be adequately sedated for a colonoscopy as her husband was not.    Current Outpatient Prescriptions  Medication Sig Dispense Refill  . ALPRAZolam (XANAX) 1 MG tablet Take 1 tablet (1 mg total) by mouth 3 (three) times daily as needed for anxiety. 120 tablet 0  . Calcium-Vitamin D 600-200 MG-UNIT per tablet Take by mouth.    . divalproex (DEPAKOTE) 500 MG DR tablet Take 3 tablets (1,500 mg total) by mouth at bedtime. 90 tablet 3  . DULoxetine (CYMBALTA) 60 MG capsule Take 1 capsule (60 mg total) by mouth daily. 30 capsule 3  . Estradiol (DIVIGEL) 1 MG/GM GEL Place 1 packet onto the skin daily. 30 g 11  . HYDROcodone-acetaminophen (NORCO) 10-325 MG tablet Take 1 tablet by mouth every 6 (six) hours as needed (pain). 120 tablet 0  . methadone (DOLOPHINE) 10 MG tablet Take 1 tablet (10 mg total) by mouth every 8 (eight) hours. 90 tablet 0  . methylphenidate (RITALIN) 10 MG tablet Take 1 tablet (10 mg total) by mouth every morning. 30 tablet 0  . Multiple Vitamins-Minerals (MULTIVITAMIN WITH MINERALS) tablet  Take 1 tablet by mouth daily.    . Omega-3 Fatty Acids (FISH OIL) 1000 MG CAPS Take by mouth.    . ondansetron (ZOFRAN) 8 MG tablet Take 1 tablet (8 mg total) by mouth every 8 (eight) hours as needed for nausea or vomiting. 90 tablet 3  . oxybutynin (DITROPAN) 5 MG tablet Take 5 mg by mouth as needed.     . polyethylene glycol powder (GLYCOLAX/MIRALAX) powder 1 scoop daily or as needed (Patient taking  differently: every other day. 1 scoop daily or as needed) 255 g 11  . solifenacin (VESICARE) 10 MG tablet Take 1 tablet (10 mg total) by mouth daily. 30 tablet 11  . zolpidem (AMBIEN) 5 MG tablet Take 1 tablet (5 mg total) by mouth at bedtime as needed for sleep. 30 tablet 0   No current facility-administered medications for this visit.     Allergies as of 01/07/2016  . (No Known Allergies)    Past Medical History:  Diagnosis Date  . Anxiety   . Arthritis    hip and wrist  . Brain cancer ([place])    2009 glioblastoma (stage 4) Dr. Bayard Hugger  . Cancer ([place])   . Seizures ([place])     Past Surgical History:  Procedure Laterality Date  . ABDOMINAL HYSTERECTOMY    . APPENDECTOMY    . BRAIN SURGERY X2    . CHOLECYSTECTOMY      Family History  Problem Relation Age of Onset  . Adopted: Yes  . Heart disease Father   . Hypotension Father   . Diabetes Father   . Allergic rhinitis Father   . Hyperlipidemia Maternal Grandfather   . Hypertension Maternal Grandfather   . Heart disease Maternal Grandfather   . Allergic rhinitis Maternal Grandfather   . Allergic rhinitis Mother   . Allergic rhinitis Maternal Grandmother   . Asthma Brother   . Allergic rhinitis Brother   . Asthma Brother   . Allergic rhinitis Brother   . COPD Brother   . Allergic rhinitis Brother   . Angioedema Neg Hx   . Atopy Neg Hx   . Eczema Neg Hx   . Immunodeficiency Neg Hx   . Urticaria Neg Hx   . Colon cancer Neg Hx     Social History   Social History  . Marital status: Married    Spouse name: N/A  . Number of children: N/A  . Years of education: N/A   Occupational History  . Not on file.   Social History Main Topics  . Smoking status: Current Every Day Smoker    Packs/day: 0.50    Years: 23.00    Types: Cigarettes  . Smokeless tobacco: Never Used  . Alcohol use No  . Drug use: No  . Sexual activity: Yes    Birth control/ protection: Other-see comments   Other Topics Concern  . Not  on file   Social History Narrative  . No narrative on file      ROS:  General: Negative for anorexia, weight loss, fever, chills, fatigue, weakness. Eyes: Negative for vision changes.  ENT: Negative for hoarseness, difficulty swallowing , nasal congestion. CV: Negative for chest pain, angina, palpitations, dyspnea on exertion, peripheral edema.  Respiratory: Negative for dyspnea at rest, dyspnea on exertion, cough, sputum, wheezing.  GI: See history of present illness. GU:  Negative for dysuria, hematuria, urinary incontinence, urinary frequency, nocturnal urination.  [id number] pain  Derm: Negative for rash or itching.  Neuro: Negative for weakness,  abnormal sensation, seizure, frequent headaches,  confusion. Complains of short-term memory loss.  Psych: Negative for anxiety, depression, suicidal ideation, hallucinations.  Endo: Negative for unusual weight change.  Heme: Negative for bruising or bleeding. Allergy: Negative for rash or hives.    Physical Examination:  BP 98/64   Pulse 90   Temp 97.8 F (36.6 C) (Oral)   Ht 5\' 4"  (1.626 m)   Wt 156 lb 9.6 oz (71 kg)   BMI 26.88 kg/m    General: Well-nourished, well-developed in no acute distress.  Head: Normocephalic, atraumatic.   Eyes: Conjunctiva pink, no icterus. Mouth: Oropharyngeal mucosa moist and pink , no lesions erythema or exudate. Neck: Supple without thyromegaly, masses, or lymphadenopathy.  Lungs: Clear to auscultation bilaterally.  Heart: Regular rate and rhythm, no murmurs rubs or gallops.  Abdomen: Bowel sounds are normal, diffuse mild tenderness, nondistended, no hepatosplenomegaly or masses, no abdominal bruits or    hernia , no rebound or guarding.   Rectal: not performed Extremities: No lower extremity edema. No clubbing or deformities.  Neuro: Alert and oriented x 4 , grossly normal neurologically.  Skin: Warm and dry, no rash or jaundice.   Psych: Alert and cooperative, normal mood and  affect.  Labs: Lab Results  Component Value Date   WBC 5.6 12/01/2015   HGB 15.3 (H) 12/01/2015   HCT 46.2 (H) 12/01/2015   MCV 95.5 12/01/2015   PLT 228 12/01/2015   Lab Results  Component Value Date   CREATININE 0.71 12/01/2015   BUN 18 12/01/2015   NA 142 12/01/2015   K 5.7 (H) 12/01/2015   CL 105 12/01/2015   CO2 29 12/01/2015   Lab Results  Component Value Date   ALT 8 12/01/2015   AST 12 12/01/2015   ALKPHOS 58 12/01/2015   BILITOT 0.4 12/01/2015     Imaging Studies: Patient had CT abdomen pelvis without contrast on 12/05/2015. 2 stable subcentimeter hypodense liver lesions, too small to characterize, consider benign. Moderate stool volume seen. Aortic atherosclerosis.

## 2016-01-07 NOTE — Telephone Encounter (Signed)
Pt called [facility] stating that she was unable to take all of the prednisone b/c of side effects and she is still not better and would like something else called in for her. Please call her husband on his cell [phone]

## 2016-01-07 NOTE — Telephone Encounter (Signed)
I just returned phone call to this phone number. I got voicemail. I left a message for them to call the office back and speak to a nurse.

## 2016-01-08 ENCOUNTER — Telehealth: Payer: Self-pay

## 2016-01-08 NOTE — Telephone Encounter (Signed)
Jill Harrell, can you check on this. According to [facility] it looks like it is well-covered.

## 2016-01-08 NOTE — Telephone Encounter (Signed)
Pt is calling and she said that the medication is working very well and she doing better but her insurance will not cover the [place]. Please advise

## 2016-01-08 NOTE — Assessment & Plan Note (Signed)
55 year old female with history of frontal lobe glioblastoma multiforme, followed at [facility] as well as locally at [facility], who presents for one-year history of abdominal bloating, pain associated with constipation. Patient has been on narcotic therapy dating back to 2008/2009. One year ago she noted increasing issues with constipation. She's never had a colonoscopy. She completed Cologuard recently but has been unable to track down her results that she cannot remember which doctor ordered it. She declined colonoscopy previously because she was afraid she wouldn't be adequately sedated because her spouse was not. We discussed use of deep sedation (propofol) with endoscopic evaluation given her history of chronic narcotics, polypharmacy.  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 She has been inadequately treated with over-the-counter agents. Start Amitiza 24 g twice a day with food. Track down Cologuard results. Patient will let us know if her PCP didn't order it. Further recommendations to follow.

## 2016-01-09 ENCOUNTER — Telehealth: Payer: Self-pay

## 2016-01-09 NOTE — Telephone Encounter (Signed)
PA for Methylphenidate 10 mg is being covered under the member's plan for (2017) . If pharmacy need assistance with obtaining a paid claim they can contact [facility] [phone] this info has been communicated to [facility] as well.

## 2016-01-09 NOTE — Telephone Encounter (Signed)
Pt states she did take all of the prednisone and is still having symptoms from the sinus infection and is req another rx be called in. Offered to hve pt make appt but is not wanting to pay a co pay   Pt was in office for this 10-25 Pls advise

## 2016-01-12 MED ORDER — PREDNISONE 20 MG PO TABS: ORAL_TABLET | ORAL | 0 refills | Status: DC

## 2016-01-12 MED ORDER — AMOXICILLIN-POT CLAVULANATE 875-125 MG PO TABS: 1.0000 | ORAL_TABLET | Freq: Two times a day (BID) | ORAL | 0 refills | Status: DC

## 2016-01-12 NOTE — Telephone Encounter (Signed)
Will give 2 Rxes:  Augmentin [id number]--- one twice daily for 14 days #28 + 0 Prednisone 20 mg----take 3 for 2 days, then take 2 for 2 days, then 1 for 2 days. #12+0.

## 2016-01-12 NOTE — Telephone Encounter (Signed)
PA was approved, see other message

## 2016-01-12 NOTE — Telephone Encounter (Signed)
Checked the pts formulary and it looks like it is covered but its a tier 3. I tried to call [facility] to see if its a PA issue and had to leave a message. Asked them to call me back or to fax me.

## 2016-01-12 NOTE — Telephone Encounter (Signed)
Rx filled called pt to confirm lvm for Jill Harrell

## 2016-01-13 DIAGNOSIS — F411 Generalized anxiety disorder: Secondary | ICD-10-CM | POA: Diagnosis not present

## 2016-01-13 DIAGNOSIS — G894 Chronic pain syndrome: Secondary | ICD-10-CM | POA: Diagnosis not present

## 2016-01-13 DIAGNOSIS — C711 Malignant neoplasm of frontal lobe: Secondary | ICD-10-CM | POA: Diagnosis not present

## 2016-01-13 DIAGNOSIS — G4700 Insomnia, unspecified: Secondary | ICD-10-CM | POA: Diagnosis not present

## 2016-01-13 NOTE — Progress Notes (Signed)
cc'd to pcp 

## 2016-01-14 ENCOUNTER — Telehealth: Payer: Self-pay

## 2016-01-14 NOTE — Telephone Encounter (Signed)
noted 

## 2016-01-14 NOTE — Telephone Encounter (Signed)
Pt has medicare, cannot use a rebate card.

## 2016-01-14 NOTE — Telephone Encounter (Signed)
Pt lvm stating she has sinusitis and is req Rx pt states it is  giving her problems. Rx was sent in on 11-13 a mess was left for pt on 11-3 to let her know. Pt stated on Vm that she had been having trouble hearing her phone ring although she was home. I left another mess for Pt 11-15 to see if she ever picked up her RX

## 2016-01-14 NOTE — Telephone Encounter (Signed)
OK. Maybe we could offer her rebate card.

## 2016-01-14 NOTE — Telephone Encounter (Signed)
Received a fax from the pharmacy- pt filled rx on 01/07/16 and her copay was $45.00. rx is covered by insurance.

## 2016-01-21 ENCOUNTER — Telehealth: Payer: Self-pay | Admitting: Gastroenterology

## 2016-01-21 NOTE — Telephone Encounter (Signed)
Jill Harrell, this patient had mentioned completely a Cologuard this year. She cannot remember which doctor did it but she never heard results. Can we look in the system to see if there?

## 2016-01-26 NOTE — Telephone Encounter (Signed)
Thanks

## 2016-01-26 NOTE — Telephone Encounter (Signed)
I got an email back from [facility], he said I would have to contact provider services d/t hippa. I called- [phone] and spoke with Maudie Mercury. They cannot give [place] any results or any information since we did not order the test. She said the pt could call and they would give her the doctors name that ordered the test so she could call and get the results. I tried to call the pt, NA- LM. I have also mailed the pt a letter with the same information in it, including the phone number to contact the company.

## 2016-01-26 NOTE — Telephone Encounter (Signed)
I have sent an email to [facility]- rep from [employer] to see if we can get the results.

## 2016-02-02 ENCOUNTER — Ambulatory Visit (INDEPENDENT_AMBULATORY_CARE_PROVIDER_SITE_OTHER): Payer: PPO | Admitting: Family Medicine

## 2016-02-02 ENCOUNTER — Encounter: Payer: Self-pay | Admitting: Family Medicine

## 2016-02-02 VITALS — BP 106/70 | HR 68 | Temp 98.8°F | Resp 16 | Ht 64.0 in | Wt 160.0 lb

## 2016-02-02 DIAGNOSIS — F411 Generalized anxiety disorder: Secondary | ICD-10-CM | POA: Diagnosis not present

## 2016-02-02 MED ORDER — BUDESONIDE-FORMOTEROL FUMARATE 160-4.5 MCG/ACT IN AERO: 2.0000 | INHALATION_SPRAY | Freq: Two times a day (BID) | RESPIRATORY_TRACT | 11 refills | Status: DC

## 2016-02-02 MED ORDER — ONDANSETRON HCL 8 MG PO TABS: 8.0000 mg | ORAL_TABLET | Freq: Three times a day (TID) | ORAL | 3 refills | Status: DC | PRN

## 2016-02-02 NOTE — Progress Notes (Signed)
 Subjective:    Patient ID: Jill Harrell, female    DOB: 05/27/1960, 55 y.o.   MRN: [id number]  Medication Refill    05/06/14 Patient is a very pleasant 54-year-old white female who is here today to establish care. She has a very complicated past medical history. 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 She underwent surgical resection on 2 separate occasions as well as chemotherapy. Patient is stage IV and incurable. However she has been in stable condition for more than 6 years.  Initially she was only given 6 months to live but that is more than 8 years ago. Due to the surgery, the patient does have a history of seizure disorder. She takes Depakote for seizure prevention. She has not had seizures in several years. She is on methadone and Norco for chronic pain. This is prescribed by her oncologist at [facility]. She also has generalized anxiety disorder for which she takes Xanax 3 times a day as well as Ambien to help her sleep. She has never taken an SSRI for prevention of her anxiety. At the present time she is not interested in starting one of these medication. Her biggest concern is a sinus infection. She reports pain in her frontal and maxillary sinuses that has been there since February. She reports postnasal drip and headaches and fever. She is taking Levaquin without relief. She is tried Flonase without relief. She is tried Allegra-D without relief.  Patient already has a colonoscopy scheduled at [facility] along with a Pap smear and mammogram. She does not need me to schedule these things. Because her cancer has been stable for so many years she is proceeding with regular cancer screening. She is overdue for a pneumonia vaccine. Her flu shot is up-to-date. Her lab work is checked every month at the cancer center.  At that time, my plan was:  Begin Augmentin 875 mg by mouth twice a day for 10 days. Also gave the patient prescription for Diflucan to be used if needed for  yeast infection. If this does not help, the patient would probably benefit from a steroid taper pack however I would want to run this by her physician at [facility] prior to giving the patient's steroids having just met her. Patient also received Pneumovax 23 today. I will give her Prevnar 13 in one year. The remainder of her lab work in her cancer screening is up-to-date and being managed at [facility]. Therefore I'll be glad to see the patient annually and as needed.  06/24/14 Interested in taking an antidepressant medication. Patient reports depression. She reports anhedonia. She reports decreased energy and she denies suicidal ideation. She does have frequent severe panic attacks for which she has to take Xanax. She denies any symptoms of bipolar or mania. She is taking Prozac in the past and had side effects from the Prozac. She would like an antidepressant medication that may help give her more energy and may also help combat hot flashes.  At that time, my plan was: We will start the patient on Effexor XR 37.5 mg by mouth every morning. In one week increase to 75 mg by mouth daily. If the patient is tolerating that well in a month we will continue the dose we could even consider increasing to help decrease the frequency of panic attacks.  08/02/14 Patient stopped effexor after 4 days due to intractable nausea.  The patient believes she may have fibromyalgia. She states the last several months she is developed   pain and stiffness in her neck and her shoulders and her hips and in her thighs. She denies any injuries to these areas. She also has chronic fatigue. Internet research has had low at her to a conclusion that she may have fibromyalgia. However she also admits that she does not sleep well at night. She will sleep only 2-3 hours at a time. She then finds that she sleeps all day long. She never feels rested. She has no energy. She can sleep all day and when she is awake she feels lightheaded and "drunk "or even  "hung over. Of note she does take her methadone in the morning. She also uses hydrocodone throughout the day for breakthrough pain in addition to Xanax. She is also on Depakote for seizure prophylaxis. I explained to the patient that some of her symptoms may be from overmedication and she says that it is possible. She agrees that it is also possible that the reason she is not sleeping well at night is because she is sleeping so much during the day and because of her lack of physical exercise throughout the day. She is also interested in using Chantix for smoking cessation.  AT that time, my plan was: Fibromyalgia is certainly a possibility. However I believe most likely cause of her symptoms is fatigued secondary to polypharmacy and overmedication or medication side effect.  I believe that the patient was more alert during the day and more active during the day, she will sleep better at night. I then believe that some of the muscle tension she is developed in her neck and shoulders and legs would improve after she was resting better and more consistently at night. Differential diagnosis includes hypersomnolence due to obstructive sleep apnea, narcolepsy, fibromyalgia, Lyme disease. I will give the patient Chantix to help with smoking cessation. I have suggested trying the patient on a stimulant such as Provigil in the morning to increase her alertness during the day such that she can increase her activity during the day and then see if this improves her sleeping patterns at night. I hesitate to give her even more sedative medications for insomnia or muscle relaxers given her medication list at the present time. Patient is willing to try this but she would like me to discuss it with her neurosurgeon Dr. DesJardins (sp?) at [facility] first.    12/29/15 Ultimately, the patient was started on methylphenidate extended release for her chronic fatigue. This seemed to help. She continues Xanax 3 times a day for anxiety. She  states that she has never taken an SSRI for depression. Recently her oncologist at [facility] recommended that she wean gradually off benzodiazepines and pain medication given the fact that she is 9 years out from surgery and her life expectancy continues to improve. They recommended that she discuss with me more appropriately treating her anxiety as they begin to wean her off some of her pain medication.  At that time, my plan was: I suggested that we try Cymbalta 60 mg a day for generalized anxiety disorder. Wean down Xanax gradually from 3 pills a day to 2 pills a day eventually to 1 pill a day and after 4 weeks I would l like her only taking a half a pill a day. Once the Cymbalta has taken full effect in 4 weeks, I will the patient to discontinue Xanax. Reassess the patient in one month. Ultimately once we have the patient off Xanax and possibly some of her pain medication, we may be able   to wean her off methylphenidate as I believe the majority of her chronic fatigue is secondary to her use of opiates and benzodiazepines. I discussed this with the patient and she is in agreement to try.  02/02/16 Patient has seen no benefit since starting Cymbalta. She will eat herself off Xanax as we discussed at her last office visit. However this caused a tremendous amount of anxiety. Ultimately she had to resume the medication. She is now taking 2 pills in the morning and 2 pills in the evening. She denies any depression today and she denies any suicidal ideations I do believe that the Cymbalta is helpful however I may have been too aggressive in trying to wean her off the Xanax that rapidly.  Past Medical History:  Diagnosis Date  . Anxiety   . Arthritis    hip and wrist  . Brain cancer ([place])    2009 glioblastoma (stage 4) Dr. Bayard Hugger  . Cancer ([place])   . Seizures ([place])    Past Surgical History:  Procedure Laterality Date  . ABDOMINAL HYSTERECTOMY    . APPENDECTOMY    . BRAIN SURGERY X2    .  CHOLECYSTECTOMY     Current Outpatient Prescriptions on File Prior to Visit  Medication Sig Dispense Refill  . ALPRAZolam (XANAX) 1 MG tablet Take 1 tablet (1 mg total) by mouth 3 (three) times daily as needed for anxiety. 120 tablet 0  . amoxicillin-clavulanate (AUGMENTIN) 875-125 MG tablet Take 1 tablet by mouth 2 (two) times daily. 28 tablet 0  . Calcium-Vitamin D 600-200 MG-UNIT per tablet Take by mouth.    . divalproex (DEPAKOTE) 500 MG DR tablet Take 3 tablets (1,500 mg total) by mouth at bedtime. 90 tablet 3  . DULoxetine (CYMBALTA) 60 MG capsule Take 1 capsule (60 mg total) by mouth daily. 30 capsule 3  . Estradiol (DIVIGEL) 1 MG/GM GEL Place 1 packet onto the skin daily. 30 g 11  . HYDROcodone-acetaminophen (NORCO) 10-325 MG tablet Take 1 tablet by mouth every 6 (six) hours as needed (pain). 120 tablet 0  . lubiprostone (AMITIZA) 24 MCG capsule Take 1 capsule (24 mcg total) by mouth 2 (two) times daily with a meal. 60 capsule 5  . methadone (DOLOPHINE) 10 MG tablet Take 1 tablet (10 mg total) by mouth every 8 (eight) hours. 90 tablet 0  . methylphenidate (RITALIN) 10 MG tablet Take 1 tablet (10 mg total) by mouth every morning. 30 tablet 0  . Multiple Vitamins-Minerals (MULTIVITAMIN WITH MINERALS) tablet Take 1 tablet by mouth daily.    . Omega-3 Fatty Acids (FISH OIL) 1000 MG CAPS Take by mouth.    . oxybutynin (DITROPAN) 5 MG tablet Take 5 mg by mouth as needed.     . solifenacin (VESICARE) 10 MG tablet Take 1 tablet (10 mg total) by mouth daily. 30 tablet 11  . zolpidem (AMBIEN) 5 MG tablet Take 1 tablet (5 mg total) by mouth at bedtime as needed for sleep. 30 tablet 0   No current facility-administered medications on file prior to visit.    No Known Allergies Social History   Social History  . Marital status: Married    Spouse name: N/A  . Number of children: N/A  . Years of education: N/A   Occupational History  . Not on file.   Social History Main Topics  . Smoking  status: Current Every Day Smoker    Packs/day: 0.50    Years: 23.00    Types: Cigarettes  .  Smokeless tobacco: Never Used  . Alcohol use No  . Drug use: No  . Sexual activity: Yes    Birth control/ protection: Other-see comments   Other Topics Concern  . Not on file   Social History Narrative  . No narrative on file      Review of Systems  All other systems reviewed and are negative.      Objective:   Physical Exam  Constitutional: She is oriented to person, place, and time. She appears well-developed and well-nourished.  Cardiovascular: Normal rate, regular rhythm and normal heart sounds.   Pulmonary/Chest: Effort normal and breath sounds normal.  Neurological: She is alert and oriented to person, place, and time. She has normal reflexes. No cranial nerve deficit. She exhibits normal muscle tone. Coordination normal.  Vitals reviewed.         Assessment & Plan:  GAD (generalized anxiety disorder)  I believe I was too aggressive in weaning her off the Xanax so quickly. Continue Cymbalta 60 mg a day. Allow more time for this to take effect. She's been on Xanax for more than 30 years. Therefore I believe we need to wean her down slowly perhaps 20% of the medication wean down per month. Therefore I recommended trying to wean her off one Xanax pill every month until she is completely off medication. She will start by taking 1 pill in the morning and 2 pills in the evening for the next 30 days. At that time we will try to switch her to 1 pill in the morning and 1 pill in the evening. We will continue this trend gradually over the next 4 months perhaps longer depending upon her results until we have successfully completely wean her off the medication. Reassess in one month  

## 2016-02-05 ENCOUNTER — Other Ambulatory Visit: Payer: Self-pay | Admitting: Family Medicine

## 2016-02-05 DIAGNOSIS — J019 Acute sinusitis, unspecified: Secondary | ICD-10-CM

## 2016-02-05 NOTE — Telephone Encounter (Signed)
Please schedule ov with RMR. 

## 2016-02-05 NOTE — Telephone Encounter (Signed)
We will wait to hear back from patient about Cologuard.  Let's plan for return ov in 2 months with Dr. Gala Romney, h/o constipation/bloating

## 2016-02-06 ENCOUNTER — Other Ambulatory Visit: Payer: Self-pay | Admitting: Family Medicine

## 2016-02-06 MED ORDER — VARENICLINE TARTRATE 0.5 MG X 11 & 1 MG X 42 PO MISC: ORAL | 0 refills | Status: DC

## 2016-02-06 NOTE — Telephone Encounter (Signed)
Reminder in epic °

## 2016-02-11 DIAGNOSIS — F411 Generalized anxiety disorder: Secondary | ICD-10-CM | POA: Diagnosis not present

## 2016-02-11 DIAGNOSIS — C711 Malignant neoplasm of frontal lobe: Secondary | ICD-10-CM | POA: Diagnosis not present

## 2016-02-11 DIAGNOSIS — G4700 Insomnia, unspecified: Secondary | ICD-10-CM | POA: Diagnosis not present

## 2016-02-11 DIAGNOSIS — G894 Chronic pain syndrome: Secondary | ICD-10-CM | POA: Diagnosis not present

## 2016-03-04 ENCOUNTER — Encounter: Payer: Self-pay | Admitting: Internal Medicine

## 2016-03-05 ENCOUNTER — Encounter: Payer: Self-pay | Admitting: Family Medicine

## 2016-03-05 ENCOUNTER — Ambulatory Visit (INDEPENDENT_AMBULATORY_CARE_PROVIDER_SITE_OTHER): Payer: PPO | Admitting: Family Medicine

## 2016-03-05 VITALS — BP 124/68 | HR 72 | Temp 98.0°F | Resp 18 | Ht 64.0 in | Wt 158.0 lb

## 2016-03-05 DIAGNOSIS — M25551 Pain in right hip: Secondary | ICD-10-CM | POA: Diagnosis not present

## 2016-03-05 DIAGNOSIS — M5441 Lumbago with sciatica, right side: Secondary | ICD-10-CM

## 2016-03-05 DIAGNOSIS — F411 Generalized anxiety disorder: Secondary | ICD-10-CM | POA: Diagnosis not present

## 2016-03-05 NOTE — Progress Notes (Signed)
 Subjective:    Patient ID: Jill Harrell, female    DOB: 06/10/1960, 56 y.o.   MRN: [id number]  Medication Refill    05/06/14 Patient is a very pleasant 56-year-old white female who is here today to establish care. She has a very complicated past medical history. 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 She underwent surgical resection on 2 separate occasions as well as chemotherapy. Patient is stage IV and incurable. However she has been in stable condition for more than 6 years.  Initially she was only given 6 months to live but that is more than 8 years ago. Due to the surgery, the patient does have a history of seizure disorder. She takes Depakote for seizure prevention. She has not had seizures in several years. She is on methadone and Norco for chronic pain. This is prescribed by her oncologist at [facility]. She also has generalized anxiety disorder for which she takes Xanax 3 times a day as well as Ambien to help her sleep. She has never taken an SSRI for prevention of her anxiety. At the present time she is not interested in starting one of these medication. Her biggest concern is a sinus infection. She reports pain in her frontal and maxillary sinuses that has been there since February. She reports postnasal drip and headaches and fever. She is taking Levaquin without relief. She is tried Flonase without relief. She is tried Allegra-D without relief.  Patient already has a colonoscopy scheduled at [facility] along with a Pap smear and mammogram. She does not need me to schedule these things. Because her cancer has been stable for so many years she is proceeding with regular cancer screening. She is overdue for a pneumonia vaccine. Her flu shot is up-to-date. Her lab work is checked every month at the cancer center.  At that time, my plan was:  Begin Augmentin 875 mg by mouth twice a day for 10 days. Also gave the patient prescription for Diflucan to be used if needed for  yeast infection. If this does not help, the patient would probably benefit from a steroid taper pack however I would want to run this by her physician at [facility] prior to giving the patient's steroids having just met her. Patient also received Pneumovax 23 today. I will give her Prevnar 13 in one year. The remainder of her lab work in her cancer screening is up-to-date and being managed at [facility]. Therefore I'll be glad to see the patient annually and as needed.  06/24/14 Interested in taking an antidepressant medication. Patient reports depression. She reports anhedonia. She reports decreased energy and she denies suicidal ideation. She does have frequent severe panic attacks for which she has to take Xanax. She denies any symptoms of bipolar or mania. She is taking Prozac in the past and had side effects from the Prozac. She would like an antidepressant medication that may help give her more energy and may also help combat hot flashes.  At that time, my plan was: We will start the patient on Effexor XR 37.5 mg by mouth every morning. In one week increase to 75 mg by mouth daily. If the patient is tolerating that well in a month we will continue the dose we could even consider increasing to help decrease the frequency of panic attacks.  08/02/14 Patient stopped effexor after 4 days due to intractable nausea.  The patient believes she may have fibromyalgia. She states the last several months she is developed   pain and stiffness in her neck and her shoulders and her hips and in her thighs. She denies any injuries to these areas. She also has chronic fatigue. Internet research has had low at her to a conclusion that she may have fibromyalgia. However she also admits that she does not sleep well at night. She will sleep only 2-3 hours at a time. She then finds that she sleeps all day long. She never feels rested. She has no energy. She can sleep all day and when she is awake she feels lightheaded and "drunk "or even  "hung over. Of note she does take her methadone in the morning. She also uses hydrocodone throughout the day for breakthrough pain in addition to Xanax. She is also on Depakote for seizure prophylaxis. I explained to the patient that some of her symptoms may be from overmedication and she says that it is possible. She agrees that it is also possible that the reason she is not sleeping well at night is because she is sleeping so much during the day and because of her lack of physical exercise throughout the day. She is also interested in using Chantix for smoking cessation.  AT that time, my plan was: Fibromyalgia is certainly a possibility. However I believe most likely cause of her symptoms is fatigued secondary to polypharmacy and overmedication or medication side effect.  I believe that the patient was more alert during the day and more active during the day, she will sleep better at night. I then believe that some of the muscle tension she is developed in her neck and shoulders and legs would improve after she was resting better and more consistently at night. Differential diagnosis includes hypersomnolence due to obstructive sleep apnea, narcolepsy, fibromyalgia, Lyme disease. I will give the patient Chantix to help with smoking cessation. I have suggested trying the patient on a stimulant such as Provigil in the morning to increase her alertness during the day such that she can increase her activity during the day and then see if this improves her sleeping patterns at night. I hesitate to give her even more sedative medications for insomnia or muscle relaxers given her medication list at the present time. Patient is willing to try this but she would like me to discuss it with her neurosurgeon Dr. DesJardins (sp?) at [facility] first.    12/29/15 Ultimately, the patient was started on methylphenidate extended release for her chronic fatigue. This seemed to help. She continues Xanax 3 times a day for anxiety. She  states that she has never taken an SSRI for depression. Recently her oncologist at [facility] recommended that she wean gradually off benzodiazepines and pain medication given the fact that she is 9 years out from surgery and her life expectancy continues to improve. They recommended that she discuss with me more appropriately treating her anxiety as they begin to wean her off some of her pain medication.  At that time, my plan was: I suggested that we try Cymbalta 60 mg a day for generalized anxiety disorder. Wean down Xanax gradually from 3 pills a day to 2 pills a day eventually to 1 pill a day and after 4 weeks I would l like her only taking a half a pill a day. Once the Cymbalta has taken full effect in 4 weeks, I will the patient to discontinue Xanax. Reassess the patient in one month. Ultimately once we have the patient off Xanax and possibly some of her pain medication, we may be able   to wean her off methylphenidate as I believe the majority of her chronic fatigue is secondary to her use of opiates and benzodiazepines. I discussed this with the patient and she is in agreement to try.  02/02/16 Patient has seen no benefit since starting Cymbalta. She will eat herself off Xanax as we discussed at her last office visit. However this caused a tremendous amount of anxiety. Ultimately she had to resume the medication. She is now taking 2 pills in the morning and 2 pills in the evening. She denies any depression today and she denies any suicidal ideations I do believe that the Cymbalta is helpful however I may have been too aggressive in trying to wean her off the Xanax that rapidly. At that time, my plan was: I believe I was too aggressive in weaning her off the Xanax so quickly. Continue Cymbalta 60 mg a day. Allow more time for this to take effect. She's been on Xanax for more than 30 years. Therefore I believe we need to wean her down slowly perhaps 20% of the medication wean down per month. Therefore I  recommended trying to wean her off one Xanax pill every month until she is completely off medication. She will start by taking 1 pill in the morning and 2 pills in the evening for the next 30 days. At that time we will try to switch her to 1 pill in the morning and 1 pill in the evening. We will continue this trend gradually over the next 4 months perhaps longer depending upon her results until we have successfully completely wean her off the medication. Reassess in one month   03/05/16 Patient never wean down on the Xanax as we discussed. She is on Cymbalta but she is seen no benefit. Today she is extremely tearful. Her husband recently underwent catheterization and per the patient report he required 5 stents and did very poorly afterwards. She is concerned that she is going to lose him. He has a very strong family history of coronary artery disease and several of his sisters died prematurely from heart attacks. All this has contributed to her anxiety and prevented her from weaning down on the medication. She is currently taking 2 tablets in the morning and 2 tablets at night of the Xanax. She also reports severe pain in her lower back radiating into the posterior aspect of her right hip and then a stinging burning shooting pain radiating from her posterior right gluteus down her right leg into her right foot. She states this is been going on for quite some time. It has been occurring off and on since 2015. In 2015 she had an x-ray of the lumbar spine that was essentially normal. She's had no imaging of the hip. The pain is becoming severe.  Past Medical History:  Diagnosis Date  . Anxiety   . Arthritis    hip and wrist  . Brain cancer ([place])    2009 glioblastoma (stage 4) Dr. Bayard Hugger  . Cancer ([place])   . Seizures ([place])    Past Surgical History:  Procedure Laterality Date  . ABDOMINAL HYSTERECTOMY    . APPENDECTOMY    . BRAIN SURGERY X2    . CHOLECYSTECTOMY     Current Outpatient  Prescriptions on File Prior to Visit  Medication Sig Dispense Refill  . ALPRAZolam (XANAX) 1 MG tablet Take 1 tablet (1 mg total) by mouth 3 (three) times daily as needed for anxiety. (Patient taking differently: Take 1 mg by  mouth 3 (three) times daily as needed for anxiety. 2 tab po BID) 120 tablet 0  . budesonide-formoterol (SYMBICORT) 160-4.5 MCG/ACT inhaler Inhale 2 puffs into the lungs 2 (two) times daily. 1 Inhaler 11  . Calcium-Vitamin D 600-200 MG-UNIT per tablet Take by mouth.    . divalproex (DEPAKOTE) 500 MG DR tablet Take 3 tablets (1,500 mg total) by mouth at bedtime. 90 tablet 3  . DULoxetine (CYMBALTA) 60 MG capsule Take 1 capsule (60 mg total) by mouth daily. 30 capsule 3  . Estradiol (DIVIGEL) 1 MG/GM GEL Place 1 packet onto the skin daily. 30 g 11  . HYDROcodone-acetaminophen (NORCO) 10-325 MG tablet Take 1 tablet by mouth every 6 (six) hours as needed (pain). 120 tablet 0  . lubiprostone (AMITIZA) 24 MCG capsule Take 1 capsule (24 mcg total) by mouth 2 (two) times daily with a meal. 60 capsule 5  . methadone (DOLOPHINE) 10 MG tablet Take 1 tablet (10 mg total) by mouth every 8 (eight) hours. 90 tablet 0  . methylphenidate (RITALIN) 10 MG tablet Take 1 tablet (10 mg total) by mouth every morning. 30 tablet 0  . Multiple Vitamins-Minerals (MULTIVITAMIN WITH MINERALS) tablet Take 1 tablet by mouth daily.    . Omega-3 Fatty Acids (FISH OIL) 1000 MG CAPS Take by mouth.    . ondansetron (ZOFRAN) 8 MG tablet Take 1 tablet (8 mg total) by mouth every 8 (eight) hours as needed for nausea or vomiting. 90 tablet 3  . oxybutynin (DITROPAN) 5 MG tablet Take 5 mg by mouth as needed.     . solifenacin (VESICARE) 10 MG tablet Take 1 tablet (10 mg total) by mouth daily. 30 tablet 11  . varenicline (CHANTIX STARTING MONTH PAK) 0.5 MG X 11 & 1 MG X 42 tablet Take one 0.5 mg tab PO QD for 3 days, then increase to one 0.5 mg tab BID for 4 days, then increase to one 1 mg tab BID. 53 tablet 0  .  zolpidem (AMBIEN) 5 MG tablet Take 1 tablet (5 mg total) by mouth at bedtime as needed for sleep. 30 tablet 0   No current facility-administered medications on file prior to visit.    No Known Allergies Social History   Social History  . Marital status: Married    Spouse name: N/A  . Number of children: N/A  . Years of education: N/A   Occupational History  . Not on file.   Social History Main Topics  . Smoking status: Current Every Day Smoker    Packs/day: 0.50    Years: 23.00    Types: Cigarettes  . Smokeless tobacco: Never Used  . Alcohol use No  . Drug use: No  . Sexual activity: Yes    Birth control/ protection: Other-see comments   Other Topics Concern  . Not on file   Social History Narrative  . No narrative on file      Review of Systems  All other systems reviewed and are negative.      Objective:   Physical Exam  Constitutional: She is oriented to person, place, and time. She appears well-developed and well-nourished.  Cardiovascular: Normal rate, regular rhythm and normal heart sounds.   Pulmonary/Chest: Effort normal and breath sounds normal.  Musculoskeletal:       Right hip: She exhibits tenderness. She exhibits normal range of motion and normal strength.       Lumbar back: She exhibits decreased range of motion, tenderness and pain. She exhibits no  spasm.  Neurological: She is alert and oriented to person, place, and time. She has normal reflexes. No cranial nerve deficit. She exhibits normal muscle tone. Coordination normal.  Vitals reviewed.         Assessment & Plan:  Right hip pain - Plan: DG HIP UNILAT WITH PELVIS 2-3 VIEWS RIGHT, MR Lumbar Spine Wo Contrast  Right-sided low back pain with right-sided sciatica, unspecified chronicity - Plan: MR Lumbar Spine Wo Contrast  GAD (generalized anxiety disorder)  Decrease Xanax to 1 mg by mouth twice a day and begin to wean down on the medication as we discussed. Obtain an MRI of the lumbar  spine is majority of her symptoms sound like right-sided sciatica. I will also obtain an x-ray of her right hip to rule out significant DJD in the right hip. Await the results of MRI of lumbar spine and hip x-ray to determine further management but she may be a candidate for epidural steroid injections if in fact there is evidence of lumbar radiculopathy

## 2016-03-08 DIAGNOSIS — C711 Malignant neoplasm of frontal lobe: Secondary | ICD-10-CM | POA: Diagnosis not present

## 2016-03-08 DIAGNOSIS — F411 Generalized anxiety disorder: Secondary | ICD-10-CM | POA: Diagnosis not present

## 2016-03-08 DIAGNOSIS — G894 Chronic pain syndrome: Secondary | ICD-10-CM | POA: Diagnosis not present

## 2016-03-08 DIAGNOSIS — R110 Nausea: Secondary | ICD-10-CM | POA: Diagnosis not present

## 2016-03-18 ENCOUNTER — Ambulatory Visit (HOSPITAL_COMMUNITY): Payer: PPO | Attending: Family Medicine

## 2016-03-24 ENCOUNTER — Other Ambulatory Visit: Payer: Self-pay | Admitting: Obstetrics & Gynecology

## 2016-03-24 ENCOUNTER — Other Ambulatory Visit (HOSPITAL_COMMUNITY): Payer: Self-pay | Admitting: Student

## 2016-03-24 ENCOUNTER — Telehealth: Payer: Self-pay | Admitting: Obstetrics & Gynecology

## 2016-03-24 DIAGNOSIS — N6001 Solitary cyst of right breast: Secondary | ICD-10-CM

## 2016-03-24 NOTE — Telephone Encounter (Signed)
Patient needs an order for a repeat mammogram. Call placed to [facility]. Order will be entered.

## 2016-03-30 ENCOUNTER — Other Ambulatory Visit: Payer: Self-pay | Admitting: Obstetrics & Gynecology

## 2016-03-30 DIAGNOSIS — R928 Other abnormal and inconclusive findings on diagnostic imaging of breast: Secondary | ICD-10-CM

## 2016-03-31 ENCOUNTER — Ambulatory Visit (HOSPITAL_COMMUNITY): Payer: PPO

## 2016-04-01 ENCOUNTER — Emergency Department (HOSPITAL_COMMUNITY)
Admission: EM | Admit: 2016-04-01 | Discharge: 2016-04-01 | Discharge status: Against Medical Advice | Payer: PPO | Attending: Emergency Medicine | Admitting: Emergency Medicine

## 2016-04-01 ENCOUNTER — Encounter (HOSPITAL_COMMUNITY): Payer: Self-pay | Admitting: *Deleted

## 2016-04-01 DIAGNOSIS — R519 Headache, unspecified: Secondary | ICD-10-CM

## 2016-04-01 DIAGNOSIS — Z85841 Personal history of malignant neoplasm of brain: Secondary | ICD-10-CM | POA: Diagnosis not present

## 2016-04-01 DIAGNOSIS — F17210 Nicotine dependence, cigarettes, uncomplicated: Secondary | ICD-10-CM | POA: Insufficient documentation

## 2016-04-01 DIAGNOSIS — R51 Headache: Secondary | ICD-10-CM | POA: Diagnosis not present

## 2016-04-01 DIAGNOSIS — Z79899 Other long term (current) drug therapy: Secondary | ICD-10-CM | POA: Diagnosis not present

## 2016-04-01 MED ORDER — METOCLOPRAMIDE HCL 5 MG/ML IJ SOLN
10.0000 mg | Freq: Once | INTRAMUSCULAR | Status: DC
Start: 2016-04-01 — End: 2016-04-01

## 2016-04-01 MED ORDER — METOCLOPRAMIDE HCL 5 MG/ML IJ SOLN
10.0000 mg | Freq: Once | INTRAMUSCULAR | Status: AC
  Administered 2016-04-01: 10 mg via INTRAVENOUS
  Filled 2016-04-01: qty 2

## 2016-04-01 NOTE — ED Notes (Signed)
Pt states she is having a bad headache on the top of her head all of the way around x 5 days.  Pt states she took the last of her Hydrocodone and Methadone with no relief.  Meds were both filled on 03/09/16.  Pt states she will have no pain medication until those can be refilled.

## 2016-04-01 NOTE — ED Provider Notes (Signed)
[facility] DEPT Provider Note   CSN: [id number] Arrival date & time: 04/01/16  1419     History   Chief Complaint Chief Complaint  Patient presents with  . Headache    HPI Jill Harrell is a 56 y.o. female.HPI She has had diffuse headaches for several months. She's had glioblastoma since 2008. For her headaches she was prescribed methadone 10 mg 2 tablets every 6 hours as needed for pain on 03/09/2016, 240 tablets and also Norco 10-3 25 one tablet every 6 hours as needed for pain 120 tablets on 03/09/2016. Norco and methadone prescribed by her oncologist Dr.Paudel. no vomiting no fever no other complaints she states that her pain is not well controlled with the medications prescribed. No other associated symptoms and associated symptoms include photophobia and phonophobia. Symptoms made worse by bright lights and by loud sounds. Not improved by anything Past Medical History:  Diagnosis Date  . Anxiety   . Arthritis    hip and wrist  . Brain cancer ([place])    2009 glioblastoma (stage 4) Dr. Bayard Hugger  . Cancer ([place])   . Seizures [facility])     Patient Active Problem List   Diagnosis Date Noted  . Constipation 01/07/2016  . Abdominal pain, periumbilical [id number]  . Aortic atherosclerosis ([place]) 12/11/2015  . 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 Coughing 07/07/2015  . Dyspnea 07/07/2015  . Glioblastoma determined by biopsy of brain ([place]) 04/21/2015  . Migraines 04/21/2015  . Seizures ([place]) 04/10/2015  . Symptomatic menopausal or female climacteric states 08/20/2013    Past Surgical History:  Procedure Laterality Date  . ABDOMINAL HYSTERECTOMY    . APPENDECTOMY    . BRAIN SURGERY X2    . CHOLECYSTECTOMY      OB History    Gravida Para Term Preterm AB Living             0   SAB TAB Ectopic Multiple Live Births                   Home Medications    Prior to Admission medications   Medication Sig Start Date End  Date Taking? Authorizing Provider  ALPRAZolam [name]) 1 MG tablet Take 1 tablet (1 mg total) by mouth 3 (three) times daily as needed for anxiety. Patient taking differently: Take 1 mg by mouth 3 (three) times daily as needed for anxiety. 2 tab po BID 05/23/15   Baird Cancer, PA-C  budesonide-formoterol (SYMBICORT) 160-4.5 MCG/ACT inhaler Inhale 2 puffs into the lungs 2 (two) times daily. 02/02/16   [name], MD  Calcium-Vitamin D 600-200 MG-UNIT per tablet Take by mouth.    Historical Provider, MD  divalproex (DEPAKOTE) 500 MG DR tablet Take 3 tablets (1,500 mg total) by mouth at bedtime. 04/21/15   Patrici Ranks, MD  DULoxetine (CYMBALTA) 60 MG capsule Take 1 capsule (60 mg total) by mouth daily. 12/29/15   [name], MD  Estradiol (DIVIGEL) 1 MG/GM GEL Place 1 packet onto the skin daily. 06/03/15   [name], MD  HYDROcodone-acetaminophen (NORCO) 10-325 MG tablet Take 1 tablet by mouth every 6 (six) hours as needed (pain). 05/23/15   Baird Cancer, PA-C  lubiprostone (AMITIZA) 24 MCG capsule Take 1 capsule (24 mcg total) by mouth 2 (two) times daily with a meal. 01/07/16   Mahala Menghini, PA-C  methadone (DOLOPHINE) 10 MG tablet Take 1 tablet (10 mg total) by mouth every 8 (eight) hours.  06/10/15   Baird Cancer, PA-C  methylphenidate (RITALIN) 10 MG tablet Take 1 tablet (10 mg total) by mouth every morning. 12/29/15   [name], MD  Multiple Vitamins-Minerals (MULTIVITAMIN WITH MINERALS) tablet Take 1 tablet by mouth daily.    Historical Provider, MD  Omega-3 Fatty Acids (FISH OIL) 1000 MG CAPS Take by mouth.    Historical Provider, MD  ondansetron (ZOFRAN) 8 MG tablet Take 1 tablet (8 mg total) by mouth every 8 (eight) hours as needed for nausea or vomiting. 02/02/16   [name], MD  oxybutynin (DITROPAN) 5 MG tablet Take 5 mg by mouth as needed.  06/18/15   Historical Provider, MD  prochlorperazine (COMPAZINE) 10 MG tablet Take 1 tablet by mouth every 6  (six) hours. 03/09/16   Historical Provider, MD  solifenacin (VESICARE) 10 MG tablet Take 1 tablet (10 mg total) by mouth daily. 07/03/15   [name], MD  varenicline (CHANTIX STARTING MONTH PAK) 0.5 MG X 11 & 1 MG X 42 tablet Take one 0.5 mg tab PO QD for 3 days, then increase to one 0.5 mg tab BID for 4 days, then increase to one 1 mg tab BID. 02/06/16   [name], MD  zolpidem (AMBIEN) 5 MG tablet Take 1 tablet (5 mg total) by mouth at bedtime as needed for sleep. 05/23/15   Baird Cancer, PA-C    Family History Family History  Problem Relation Age of Onset  . Adopted: Yes  . Heart disease Father   . Hypotension Father   . Diabetes Father   . Allergic rhinitis Father   . Hyperlipidemia Maternal Grandfather   . Hypertension Maternal Grandfather   . Heart disease Maternal Grandfather   . Allergic rhinitis Maternal Grandfather   . Allergic rhinitis Mother   . Allergic rhinitis Maternal Grandmother   . Asthma Brother   . Allergic rhinitis Brother   . Asthma Brother   . Allergic rhinitis Brother   . COPD Brother   . Allergic rhinitis Brother   . Angioedema Neg Hx   . Atopy Neg Hx   . Eczema Neg Hx   . Immunodeficiency Neg Hx   . Urticaria Neg Hx   . Colon cancer Neg Hx     Social History Social History  Substance Use Topics  . Smoking status: Current Every Day Smoker    Packs/day: 0.50    Years: 23.00    Types: Cigarettes  . Smokeless tobacco: Never Used  . Alcohol use No     Allergies   Patient has no known allergies.   Review of Systems Review of Systems  Constitutional: Negative.   HENT: Negative.        Phonophobia  Eyes: Positive for photophobia.  Respiratory: Negative.   Cardiovascular: Negative.   Gastrointestinal: Negative.   Musculoskeletal: Negative.   Skin: Negative.   Allergic/Immunologic: Positive for immunocompromised state.       Cancer patient  Neurological: Positive for headaches.  Psychiatric/Behavioral: Negative.   All other  systems reviewed and are negative.    Physical Exam Updated Vital Signs BP (!) 108/39 (BP Location: Left Arm)   Pulse 76   Temp 97.9 F (36.6 C) (Oral)   Resp 18   Ht 5\' 4"  (1.626 m)   Wt 150 lb (68 kg)   SpO2 98%   BMI 25.75 kg/m   Physical Exam  Constitutional: She is oriented to person, place, and time. She appears well-developed and well-nourished. No  distress.  HENT:  Head: Normocephalic and atraumatic.  Eyes: Conjunctivae are normal. Pupils are equal, round, and reactive to light.  Neck: Neck supple. No tracheal deviation present. No thyromegaly present.  Cardiovascular: Normal rate and regular rhythm.   No murmur heard. Pulmonary/Chest: Effort normal and breath sounds normal.  Abdominal: Soft. Bowel sounds are normal. She exhibits no distension. There is no tenderness.  Musculoskeletal: Normal range of motion. She exhibits no edema or tenderness.  Neurological: She is alert and oriented to person, place, and time. Coordination normal.  Gait normal Romberg normal pronator drift normal  Skin: Skin is warm and dry. No rash noted.  Psychiatric: She has a normal mood and affect.  Nursing note and vitals reviewed.    ED Treatments / Results  Labs (all labs ordered are listed, but only abnormal results are displayed) Labs Reviewed - No data to display  EKG  EKG Interpretation None       Radiology No results found.  Procedures Procedures (including critical care time)  Medications Ordered in ED Medications  metoCLOPramide (REGLAN) injection 10 mg (not administered)     Initial Impression / Assessment and Plan / ED Course  I have reviewed the triage vital signs and the nursing notes.  Pertinent labs & imaging results that were available during my care of the patient were reviewed by me and considered in my medical decision making (see chart for details).     I discussed case with Dr. Koleen Nimrod on call for Dr.Paudel. No further prescriptions written  today. He will relay message on to Dr.Paudel that patient may need referral to pain clinic. She is instructed to keep her scheduled appointment at [place] office in 5 days Patient left the ED without my being notified. Final Clinical Impressions(s) / ED Diagnoses  Dx chronic headache Final diagnoses:  None    New Prescriptions New Prescriptions   No medications on file     Orlie Dakin, MD 04/01/16 1704

## 2016-04-01 NOTE — ED Notes (Signed)
Pt ambulatory out of ED with significant other.  Removed own IV.

## 2016-04-01 NOTE — ED Triage Notes (Signed)
Pt has terminal brain cancer and doesn't get chemo or radiation. Pt get headaches periodically but can manage at home. This episode has lasted 5 days. Pt has light and sound sensitivity.

## 2016-04-01 NOTE — ED Notes (Addendum)
Pt very upset about not getting narcotic medication.  States "you are just wasting that medicine you are giving me because I take Methadone 20mg  at a time plus Hydrocodone."  Advised pt Reglan was for headaches and nausea.  Pt states she needs something stronger than her Methadone.  Advised her we would not be administering that here.  Pt began talking to her visitor about how horrible treatment at this hospital is and just because you are on narcotics they think you want more.  Advised pt we treat with nonnarcotic pain medication when warranted and at the physicians discretion.  Pt states "you just wait until I get my survey and tell them how bad I was talked to and was treated."  Asked pt if she even wanted the Reglan or IV to which pt stated yes.  Pt now states she is not out of her pain medication, she just left them at home.

## 2016-04-06 DIAGNOSIS — G894 Chronic pain syndrome: Secondary | ICD-10-CM | POA: Diagnosis not present

## 2016-04-06 DIAGNOSIS — G4700 Insomnia, unspecified: Secondary | ICD-10-CM | POA: Diagnosis not present

## 2016-04-06 DIAGNOSIS — F411 Generalized anxiety disorder: Secondary | ICD-10-CM | POA: Diagnosis not present

## 2016-04-06 DIAGNOSIS — C711 Malignant neoplasm of frontal lobe: Secondary | ICD-10-CM | POA: Diagnosis not present

## 2016-04-12 ENCOUNTER — Ambulatory Visit (HOSPITAL_COMMUNITY)
Admission: RE | Admit: 2016-04-12 | Discharge: 2016-04-12 | Disposition: A | Discharge status: Home / Self Care | Payer: PPO | Source: Ambulatory Visit | Attending: Family Medicine | Admitting: Family Medicine

## 2016-04-12 DIAGNOSIS — M25551 Pain in right hip: Secondary | ICD-10-CM | POA: Insufficient documentation

## 2016-04-12 DIAGNOSIS — M5441 Lumbago with sciatica, right side: Secondary | ICD-10-CM

## 2016-04-12 DIAGNOSIS — M5127 Other intervertebral disc displacement, lumbosacral region: Secondary | ICD-10-CM | POA: Insufficient documentation

## 2016-04-12 DIAGNOSIS — M48061 Spinal stenosis, lumbar region without neurogenic claudication: Secondary | ICD-10-CM | POA: Diagnosis not present

## 2016-04-12 DIAGNOSIS — M545 Low back pain: Secondary | ICD-10-CM | POA: Diagnosis not present

## 2016-04-13 ENCOUNTER — Other Ambulatory Visit: Payer: Self-pay | Admitting: Family Medicine

## 2016-04-13 DIAGNOSIS — M5416 Radiculopathy, lumbar region: Secondary | ICD-10-CM

## 2016-04-27 ENCOUNTER — Inpatient Hospital Stay (HOSPITAL_COMMUNITY): Admission: RE | Admit: 2016-04-27 | Payer: PPO | Source: Ambulatory Visit

## 2016-05-11 DIAGNOSIS — R110 Nausea: Secondary | ICD-10-CM | POA: Diagnosis not present

## 2016-05-11 DIAGNOSIS — G4700 Insomnia, unspecified: Secondary | ICD-10-CM | POA: Diagnosis not present

## 2016-05-11 DIAGNOSIS — F411 Generalized anxiety disorder: Secondary | ICD-10-CM | POA: Diagnosis not present

## 2016-05-11 DIAGNOSIS — C711 Malignant neoplasm of frontal lobe: Secondary | ICD-10-CM | POA: Diagnosis not present

## 2016-05-11 DIAGNOSIS — G894 Chronic pain syndrome: Secondary | ICD-10-CM | POA: Diagnosis not present

## 2016-05-24 DIAGNOSIS — M5441 Lumbago with sciatica, right side: Secondary | ICD-10-CM | POA: Diagnosis not present

## 2016-05-24 DIAGNOSIS — G8929 Other chronic pain: Secondary | ICD-10-CM | POA: Diagnosis not present

## 2016-05-25 ENCOUNTER — Encounter (HOSPITAL_COMMUNITY): Payer: PPO

## 2016-06-01 DIAGNOSIS — M9902 Segmental and somatic dysfunction of thoracic region: Secondary | ICD-10-CM | POA: Diagnosis not present

## 2016-06-01 DIAGNOSIS — M9905 Segmental and somatic dysfunction of pelvic region: Secondary | ICD-10-CM | POA: Diagnosis not present

## 2016-06-01 DIAGNOSIS — M546 Pain in thoracic spine: Secondary | ICD-10-CM | POA: Diagnosis not present

## 2016-06-01 DIAGNOSIS — M5441 Lumbago with sciatica, right side: Secondary | ICD-10-CM | POA: Diagnosis not present

## 2016-06-01 DIAGNOSIS — M9903 Segmental and somatic dysfunction of lumbar region: Secondary | ICD-10-CM | POA: Diagnosis not present

## 2016-06-02 DIAGNOSIS — M545 Low back pain: Secondary | ICD-10-CM | POA: Diagnosis not present

## 2016-06-02 DIAGNOSIS — M5431 Sciatica, right side: Secondary | ICD-10-CM | POA: Diagnosis not present

## 2016-06-07 DIAGNOSIS — M5431 Sciatica, right side: Secondary | ICD-10-CM | POA: Diagnosis not present

## 2016-06-07 DIAGNOSIS — M545 Low back pain: Secondary | ICD-10-CM | POA: Diagnosis not present

## 2016-06-08 DIAGNOSIS — G4700 Insomnia, unspecified: Secondary | ICD-10-CM | POA: Diagnosis not present

## 2016-06-08 DIAGNOSIS — G894 Chronic pain syndrome: Secondary | ICD-10-CM | POA: Diagnosis not present

## 2016-06-08 DIAGNOSIS — C711 Malignant neoplasm of frontal lobe: Secondary | ICD-10-CM | POA: Diagnosis not present

## 2016-06-08 DIAGNOSIS — F411 Generalized anxiety disorder: Secondary | ICD-10-CM | POA: Diagnosis not present

## 2016-06-14 ENCOUNTER — Other Ambulatory Visit: Payer: Self-pay | Admitting: Family Medicine

## 2016-06-14 DIAGNOSIS — F411 Generalized anxiety disorder: Secondary | ICD-10-CM

## 2016-06-15 ENCOUNTER — Encounter (HOSPITAL_COMMUNITY): Payer: PPO

## 2016-06-18 ENCOUNTER — Ambulatory Visit: Payer: PPO | Admitting: Family Medicine

## 2016-06-21 DIAGNOSIS — M5431 Sciatica, right side: Secondary | ICD-10-CM | POA: Diagnosis not present

## 2016-06-21 DIAGNOSIS — M545 Low back pain: Secondary | ICD-10-CM | POA: Diagnosis not present

## 2016-06-22 ENCOUNTER — Telehealth: Payer: Self-pay | Admitting: Family Medicine

## 2016-06-22 NOTE — Telephone Encounter (Signed)
Patient is calling to speak with you regarding something she said, did not leave details but said she would like to speak with you today if possible  [phone]

## 2016-06-23 NOTE — Telephone Encounter (Signed)
LMTRC

## 2016-06-30 NOTE — Telephone Encounter (Signed)
Spoke to pt and she had questions about her husband and I explained to her that he NTBS and appt made.

## 2016-07-06 DIAGNOSIS — M9905 Segmental and somatic dysfunction of pelvic region: Secondary | ICD-10-CM | POA: Diagnosis not present

## 2016-07-06 DIAGNOSIS — M9903 Segmental and somatic dysfunction of lumbar region: Secondary | ICD-10-CM | POA: Diagnosis not present

## 2016-07-06 DIAGNOSIS — M5441 Lumbago with sciatica, right side: Secondary | ICD-10-CM | POA: Diagnosis not present

## 2016-07-06 DIAGNOSIS — M9902 Segmental and somatic dysfunction of thoracic region: Secondary | ICD-10-CM | POA: Diagnosis not present

## 2016-07-06 DIAGNOSIS — M546 Pain in thoracic spine: Secondary | ICD-10-CM | POA: Diagnosis not present

## 2016-07-14 DIAGNOSIS — F411 Generalized anxiety disorder: Secondary | ICD-10-CM | POA: Diagnosis not present

## 2016-07-14 DIAGNOSIS — C711 Malignant neoplasm of frontal lobe: Secondary | ICD-10-CM | POA: Diagnosis not present

## 2016-07-14 DIAGNOSIS — G894 Chronic pain syndrome: Secondary | ICD-10-CM | POA: Diagnosis not present

## 2016-08-17 DIAGNOSIS — C711 Malignant neoplasm of frontal lobe: Secondary | ICD-10-CM | POA: Diagnosis not present

## 2016-08-18 DIAGNOSIS — G894 Chronic pain syndrome: Secondary | ICD-10-CM | POA: Diagnosis not present

## 2016-08-18 DIAGNOSIS — C711 Malignant neoplasm of frontal lobe: Secondary | ICD-10-CM | POA: Diagnosis not present

## 2016-08-18 DIAGNOSIS — F411 Generalized anxiety disorder: Secondary | ICD-10-CM | POA: Diagnosis not present

## 2016-08-18 DIAGNOSIS — G4700 Insomnia, unspecified: Secondary | ICD-10-CM | POA: Diagnosis not present

## 2016-08-19 ENCOUNTER — Other Ambulatory Visit: Payer: Self-pay

## 2016-08-19 NOTE — Telephone Encounter (Signed)
Last OV 1/5 Last refill 12/29/2015 Ok to refill?

## 2016-08-19 NOTE — Telephone Encounter (Signed)
Overdue for ov ? °

## 2016-09-14 DIAGNOSIS — C711 Malignant neoplasm of frontal lobe: Secondary | ICD-10-CM | POA: Diagnosis not present

## 2016-09-14 DIAGNOSIS — G4700 Insomnia, unspecified: Secondary | ICD-10-CM | POA: Diagnosis not present

## 2016-09-14 DIAGNOSIS — F411 Generalized anxiety disorder: Secondary | ICD-10-CM | POA: Diagnosis not present

## 2016-09-14 DIAGNOSIS — G894 Chronic pain syndrome: Secondary | ICD-10-CM | POA: Diagnosis not present

## 2016-10-08 ENCOUNTER — Ambulatory Visit: Payer: PPO | Admitting: Family Medicine

## 2016-10-12 DIAGNOSIS — G4700 Insomnia, unspecified: Secondary | ICD-10-CM | POA: Diagnosis not present

## 2016-10-12 DIAGNOSIS — C711 Malignant neoplasm of frontal lobe: Secondary | ICD-10-CM | POA: Diagnosis not present

## 2016-10-12 DIAGNOSIS — F411 Generalized anxiety disorder: Secondary | ICD-10-CM | POA: Diagnosis not present

## 2016-10-13 DIAGNOSIS — M9905 Segmental and somatic dysfunction of pelvic region: Secondary | ICD-10-CM | POA: Diagnosis not present

## 2016-10-13 DIAGNOSIS — M546 Pain in thoracic spine: Secondary | ICD-10-CM | POA: Diagnosis not present

## 2016-10-13 DIAGNOSIS — M5441 Lumbago with sciatica, right side: Secondary | ICD-10-CM | POA: Diagnosis not present

## 2016-10-13 DIAGNOSIS — M9903 Segmental and somatic dysfunction of lumbar region: Secondary | ICD-10-CM | POA: Diagnosis not present

## 2016-10-13 DIAGNOSIS — M9902 Segmental and somatic dysfunction of thoracic region: Secondary | ICD-10-CM | POA: Diagnosis not present

## 2016-10-15 ENCOUNTER — Ambulatory Visit (INDEPENDENT_AMBULATORY_CARE_PROVIDER_SITE_OTHER): Payer: PPO | Admitting: Family Medicine

## 2016-10-15 VITALS — BP 100/64 | HR 74 | Temp 98.1°F | Resp 14 | Ht 64.0 in | Wt 157.0 lb

## 2016-10-15 DIAGNOSIS — J0100 Acute maxillary sinusitis, unspecified: Secondary | ICD-10-CM

## 2016-10-15 MED ORDER — METHYLPHENIDATE HCL 10 MG PO TABS: 10.0000 mg | ORAL_TABLET | ORAL | 0 refills | Status: DC

## 2016-10-15 MED ORDER — AMOXICILLIN-POT CLAVULANATE 875-125 MG PO TABS
1.0000 | ORAL_TABLET | Freq: Two times a day (BID) | ORAL | 0 refills | Status: DC
Start: 2016-10-15 — End: 2016-10-18

## 2016-10-15 MED ORDER — FLUCONAZOLE 150 MG PO TABS: 150.0000 mg | ORAL_TABLET | Freq: Once | ORAL | 0 refills | Status: AC

## 2016-10-15 MED ORDER — BUDESONIDE-FORMOTEROL FUMARATE 160-4.5 MCG/ACT IN AERO: 2.0000 | INHALATION_SPRAY | Freq: Two times a day (BID) | RESPIRATORY_TRACT | 11 refills | Status: DC

## 2016-10-15 NOTE — Progress Notes (Signed)
Subjective:    Patient ID: Jill Harrell, female    DOB: 05-17-1960, 56 y.o.   MRN: [id number]  HPI The patient's symptoms began approximately 4 weeks ago with rhinorrhea, head congestion, postnasal drip. They have progressively worsened over the last 4 weeks to now constant left frontal sinus pain, left maxillary sinus pain, pain in the roof of her mouth, pain in her left ear. She also reports head congestion and inability to breathe through her nose. She reports postnasal drip leading to cough. She denies any shortness of breath or chest pain. She denies any fevers but she does report fatigue and generally feeling sick and run down. She is also requesting a refill on her Ritalin that she uses for chronic fatigue and depression. She is currently taking 10 mg a day. She denies any palpitations. She denies any chest pain. She denies any shortness of breath. She does report some increasing anxiety 2 months since buying a new home but she was on Ritalin long before that with no problems with anxiety. Past Medical History:  Diagnosis Date  . Anxiety   . Arthritis    hip and wrist  . 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 Bayard Hugger  . Cancer ([place])   . Seizures ([place])    Past Surgical History:  Procedure Laterality Date  . ABDOMINAL HYSTERECTOMY    . APPENDECTOMY    . BRAIN SURGERY X2    . CHOLECYSTECTOMY     Current Outpatient Prescriptions on File Prior to Visit  Medication Sig Dispense Refill  . ALPRAZolam (XANAX) 1 MG tablet Take 1 tablet (1 mg total) by mouth 3 (three) times daily as needed for anxiety. (Patient taking differently: Take 1 mg by mouth 3 (three) times daily as needed for anxiety. 2 tab po BID) 120 tablet 0  . divalproex (DEPAKOTE) 500 MG DR tablet Take 3 tablets (1,500 mg total) by mouth at bedtime. 90 tablet 3  . DULoxetine (CYMBALTA) 60 MG capsule TAKE ONE CAPSULE BY MOUTH ONCE DAILY 90 capsule 3  . Estradiol (DIVIGEL) 1 MG/GM GEL Place 1 packet onto  the skin daily. 30 g 11  . HYDROcodone-acetaminophen (NORCO) 10-325 MG tablet Take 1 tablet by mouth every 6 (six) hours as needed (pain). 120 tablet 0  . lubiprostone (AMITIZA) 24 MCG capsule Take 1 capsule (24 mcg total) by mouth 2 (two) times daily with a meal. 60 capsule 5  . methadone (DOLOPHINE) 10 MG tablet Take 1 tablet (10 mg total) by mouth every 8 (eight) hours. 90 tablet 0  . mirabegron ER (MYRBETRIQ) 50 MG TB24 tablet Take 50 mg by mouth daily.    . ondansetron (ZOFRAN) 8 MG tablet Take 1 tablet (8 mg total) by mouth every 8 (eight) hours as needed for nausea or vomiting. 90 tablet 3  . prochlorperazine (COMPAZINE) 10 MG tablet Take 1 tablet by mouth every 6 (six) hours.  2  . zolpidem (AMBIEN) 5 MG tablet Take 1 tablet (5 mg total) by mouth at bedtime as needed for sleep. 30 tablet 0   No current facility-administered medications on file prior to visit.    No Known Allergies Social History   Social History  . Marital status: Married    Spouse name: N/A  . Number of children: N/A  . Years of education: N/A   Occupational History  . Not on file.   Social History Main Topics  . Smoking status: Current Every Day Smoker  Packs/day: 0.50    Years: 23.00    Types: Cigarettes  . Smokeless tobacco: Never Used  . Alcohol use No  . Drug use: No  . Sexual activity: Yes    Birth control/ protection: Other-see comments   Other Topics Concern  . Not on file   Social History Narrative  . No narrative on file      Review of Systems  All other systems reviewed and are negative.      Objective:   Physical Exam  Constitutional: She appears well-developed and well-nourished.  HENT:  Right Ear: Tympanic membrane, external ear and ear canal normal.  Left Ear: Tympanic membrane, external ear and ear canal normal.  Nose: Mucosal edema and rhinorrhea present. Right sinus exhibits maxillary sinus tenderness and frontal sinus tenderness. Left sinus exhibits maxillary sinus  tenderness and frontal sinus tenderness.  Mouth/Throat: Oropharynx is clear and moist. No oropharyngeal exudate.  Neck: Neck supple.  Cardiovascular: Normal rate, regular rhythm and normal heart sounds.   Pulmonary/Chest: Effort normal and breath sounds normal. No respiratory distress. She has no wheezes. She has no rales.  Lymphadenopathy:    She has no cervical adenopathy.  Vitals reviewed.         Assessment & Plan:  Acute maxillary sinusitis, recurrence not specified  Patient history developed a secondary bacterial sinus infection. Begin Augmentin 875 mg by mouth twice a day for 10 days. I also gave the patient prescription for Diflucan 150 mg by mouth 1 should she develop a secondary yeast infection after taking Augmentin. I did refill her Reglan. She is to take 10 mg a day. I gave her 30 tablets with 2 refills. Recheck if no better in 1 week or sooner if worse.

## 2016-10-18 ENCOUNTER — Telehealth: Payer: Self-pay | Admitting: Family Medicine

## 2016-10-18 MED ORDER — AZITHROMYCIN 250 MG PO TABS: ORAL_TABLET | ORAL | 0 refills | Status: DC

## 2016-10-18 NOTE — Telephone Encounter (Signed)
Medication called/sent to requested pharmacy and pt aware via vm 

## 2016-10-18 NOTE — Telephone Encounter (Signed)
Feels she is having a reaction to the Amoxacillin.  Has rash on stomach, waist and arms.  Itching like crazy.  Needs something else.  Please advise?

## 2016-10-18 NOTE — Telephone Encounter (Signed)
Switch to z pack.

## 2016-10-25 ENCOUNTER — Encounter: Payer: Self-pay | Admitting: Family Medicine

## 2016-10-25 ENCOUNTER — Ambulatory Visit (INDEPENDENT_AMBULATORY_CARE_PROVIDER_SITE_OTHER): Payer: PPO | Admitting: Family Medicine

## 2016-10-25 VITALS — BP 112/68 | HR 74 | Temp 97.8°F | Resp 16 | Ht 64.0 in | Wt 158.0 lb

## 2016-10-25 DIAGNOSIS — J0100 Acute maxillary sinusitis, unspecified: Secondary | ICD-10-CM

## 2016-10-25 MED ORDER — FLUCONAZOLE 150 MG PO TABS: 150.0000 mg | ORAL_TABLET | Freq: Once | ORAL | 0 refills | Status: AC

## 2016-10-25 MED ORDER — CEFDINIR 300 MG PO CAPS: 300.0000 mg | ORAL_CAPSULE | Freq: Two times a day (BID) | ORAL | 0 refills | Status: DC

## 2016-10-25 NOTE — Progress Notes (Signed)
Subjective:    Patient ID: Jill Harrell, female    DOB: 08-05-1960, 56 y.o.   MRN: [id number]  HPI  10/15/16 The patient's symptoms began approximately 4 weeks ago with rhinorrhea, head congestion, postnasal drip. They have progressively worsened over the last 4 weeks to now constant left frontal sinus pain, left maxillary sinus pain, pain in the roof of her mouth, pain in her left ear. She also reports head congestion and inability to breathe through her nose. She reports postnasal drip leading to cough. She denies any shortness of breath or chest pain. She denies any fevers but she does report fatigue and generally feeling sick and run down. She is also requesting a refill on her Ritalin that she uses for chronic fatigue and depression. She is currently taking 10 mg a day. She denies any palpitations. She denies any chest pain. She denies any shortness of breath. She does report some increasing anxiety 2 months since buying a new home but she was on Ritalin long before that with no problems with anxiety.  At that time, my plan was: Patient history developed a secondary bacterial sinus infection. Begin Augmentin 875 mg by mouth twice a day for 10 days. I also gave the patient prescription for Diflucan 150 mg by mouth 1 should she develop a secondary yeast infection after taking Augmentin. I did refill her Reglan. She is to take 10 mg a day. I gave her 30 tablets with 2 refills. Recheck if no better in 1 week or sooner if worse.  10/25/16 Patient took 1 day worth of amoxicillin and then discontinue the medication because of a rash that developed that is no longer present. Shortly thereafter she developed severe pain in her left ear which is still present and getting worse in addition to the sinus pressure and sinus pain that she already reported. The majority of her pain is located in the left frontal sinus, left maxillary sinus, and behind the left tympanic membrane. On examination today however the  left tympanic membrane appears healthy. Is pearly gray. There is no obvious effusion. There is no erythema or rash or vesicles. She continues to have significant tenderness to palpation in the left maxillary sinus and left frontal sinus. She took a Z-Pak which provided no relief. Past Medical History:  Diagnosis Date  . Anxiety   . Arthritis    hip and wrist  . Brain cancer ([place])    2009 glioblastoma (stage 4) Dr. Bayard Hugger  . Cancer ([place])   . Seizures ([place])    Past Surgical History:  Procedure Laterality Date  . ABDOMINAL HYSTERECTOMY    . APPENDECTOMY    . BRAIN SURGERY X2    . CHOLECYSTECTOMY     Current Outpatient Prescriptions on File Prior to Visit  Medication Sig Dispense Refill  . ALPRAZolam (XANAX) 1 MG tablet Take 1 tablet (1 mg total) by mouth 3 (three) times daily as needed for anxiety. (Patient taking differently: Take 1 mg by mouth 3 (three) times daily as needed for anxiety. 2 tab po BID) 120 tablet 0  . azithromycin (ZITHROMAX) 250 MG tablet 2 tabs po x 1 day; 1 tab po qd x days 2-5 6 tablet 0  . budesonide-formoterol (SYMBICORT) 160-4.5 MCG/ACT inhaler Inhale 2 puffs into the lungs 2 (two) times daily. 1 Inhaler 11  . divalproex (DEPAKOTE) 500 MG DR tablet Take 3 tablets (1,500 mg total) by mouth at bedtime. 90 tablet 3  . DULoxetine (CYMBALTA) 60 MG capsule TAKE  ONE CAPSULE BY MOUTH ONCE DAILY 90 capsule 3  . Estradiol (DIVIGEL) 1 MG/GM GEL Place 1 packet onto the skin daily. 30 g 11  . HYDROcodone-acetaminophen (NORCO) 10-325 MG tablet Take 1 tablet by mouth every 6 (six) hours as needed (pain). 120 tablet 0  . lubiprostone (AMITIZA) 24 MCG capsule Take 1 capsule (24 mcg total) by mouth 2 (two) times daily with a meal. 60 capsule 5  . methadone (DOLOPHINE) 10 MG tablet Take 1 tablet (10 mg total) by mouth every 8 (eight) hours. 90 tablet 0  . methylphenidate (RITALIN) 10 MG tablet Take 1 tablet (10 mg total) by mouth every morning. 30 tablet 0  . mirabegron ER  (MYRBETRIQ) 50 MG TB24 tablet Take 50 mg by mouth daily.    . ondansetron (ZOFRAN) 8 MG tablet Take 1 tablet (8 mg total) by mouth every 8 (eight) hours as needed for nausea or vomiting. 90 tablet 3  . prochlorperazine (COMPAZINE) 10 MG tablet Take 1 tablet by mouth every 6 (six) hours.  2  . zolpidem (AMBIEN) 5 MG tablet Take 1 tablet (5 mg total) by mouth at bedtime as needed for sleep. 30 tablet 0   No current facility-administered medications on file prior to visit.    No Known Allergies Social History   Social History  . Marital status: Married    Spouse name: N/A  . Number of children: N/A  . Years of education: N/A   Occupational History  . Not on file.   Social History Main Topics  . Smoking status: Current Every Day Smoker    Packs/day: 0.50    Years: 23.00    Types: Cigarettes  . Smokeless tobacco: Never Used  . Alcohol use No  . Drug use: No  . Sexual activity: Yes    Birth control/ protection: Other-see comments   Other Topics Concern  . Not on file   Social History Narrative  . No narrative on file      Review of Systems  All other systems reviewed and are negative.      Objective:   Physical Exam  Constitutional: She appears well-developed and well-nourished.  HENT:  Right Ear: Tympanic membrane, external ear and ear canal normal.  Left Ear: Tympanic membrane, external ear and ear canal normal.  Nose: Mucosal edema and rhinorrhea present. Right sinus exhibits maxillary sinus tenderness and frontal sinus tenderness. Left sinus exhibits maxillary sinus tenderness and frontal sinus tenderness.  Mouth/Throat: Oropharynx is clear and moist. No oropharyngeal exudate.  Neck: Neck supple.  Cardiovascular: Normal rate, regular rhythm and normal heart sounds.   Pulmonary/Chest: Effort normal and breath sounds normal. No respiratory distress. She has no wheezes. She has no rales.  Lymphadenopathy:    She has no cervical adenopathy.  Vitals  reviewed.         Assessment & Plan:  Acute maxillary sinusitis, recurrence not specified Patient's pain is out of proportion to exam. Therefore I'm not comfortable saying that this is simply a sinus infection. I will give the patient on Omnicef 300 mg by mouth twice a day with strict return precautions. If symptoms are not improving over the next 48-72 hours, I will proceed with a CT scan of the sinuses and head to rule out complications given her significant past medical history. Patient reports a "clear" MRI in July

## 2016-10-28 ENCOUNTER — Telehealth: Payer: Self-pay | Admitting: Family Medicine

## 2016-10-28 ENCOUNTER — Other Ambulatory Visit: Payer: Self-pay | Admitting: Family Medicine

## 2016-10-28 DIAGNOSIS — Z85841 Personal history of malignant neoplasm of brain: Secondary | ICD-10-CM

## 2016-10-28 DIAGNOSIS — J328 Other chronic sinusitis: Secondary | ICD-10-CM

## 2016-10-28 DIAGNOSIS — J0111 Acute recurrent frontal sinusitis: Secondary | ICD-10-CM

## 2016-10-28 NOTE — Telephone Encounter (Signed)
Pt called and states that she is no better and was told to call back if no better in 3 days. Per note will do a CT of head and sinuses. Pt aware and CT order placed. Pt can not go on Tuesday's or Thursday's.

## 2016-11-09 ENCOUNTER — Ambulatory Visit (HOSPITAL_COMMUNITY): Payer: PPO

## 2016-11-16 DIAGNOSIS — G894 Chronic pain syndrome: Secondary | ICD-10-CM | POA: Diagnosis not present

## 2016-11-16 DIAGNOSIS — F411 Generalized anxiety disorder: Secondary | ICD-10-CM | POA: Diagnosis not present

## 2016-11-16 DIAGNOSIS — G4700 Insomnia, unspecified: Secondary | ICD-10-CM | POA: Diagnosis not present

## 2016-11-16 DIAGNOSIS — C711 Malignant neoplasm of frontal lobe: Secondary | ICD-10-CM | POA: Diagnosis not present

## 2016-11-30 DIAGNOSIS — M546 Pain in thoracic spine: Secondary | ICD-10-CM | POA: Diagnosis not present

## 2016-11-30 DIAGNOSIS — M9905 Segmental and somatic dysfunction of pelvic region: Secondary | ICD-10-CM | POA: Diagnosis not present

## 2016-11-30 DIAGNOSIS — M5441 Lumbago with sciatica, right side: Secondary | ICD-10-CM | POA: Diagnosis not present

## 2016-11-30 DIAGNOSIS — M9903 Segmental and somatic dysfunction of lumbar region: Secondary | ICD-10-CM | POA: Diagnosis not present

## 2016-11-30 DIAGNOSIS — M9902 Segmental and somatic dysfunction of thoracic region: Secondary | ICD-10-CM | POA: Diagnosis not present

## 2016-12-14 DIAGNOSIS — C711 Malignant neoplasm of frontal lobe: Secondary | ICD-10-CM | POA: Diagnosis not present

## 2016-12-14 DIAGNOSIS — G4700 Insomnia, unspecified: Secondary | ICD-10-CM | POA: Diagnosis not present

## 2016-12-14 DIAGNOSIS — G894 Chronic pain syndrome: Secondary | ICD-10-CM | POA: Diagnosis not present

## 2016-12-14 DIAGNOSIS — F411 Generalized anxiety disorder: Secondary | ICD-10-CM | POA: Diagnosis not present

## 2016-12-16 ENCOUNTER — Encounter: Payer: Self-pay | Admitting: Family Medicine

## 2016-12-16 ENCOUNTER — Encounter (HOSPITAL_COMMUNITY): Payer: Self-pay | Admitting: Emergency Medicine

## 2016-12-16 ENCOUNTER — Ambulatory Visit (INDEPENDENT_AMBULATORY_CARE_PROVIDER_SITE_OTHER): Payer: PPO | Admitting: Family Medicine

## 2016-12-16 ENCOUNTER — Emergency Department (HOSPITAL_COMMUNITY)
Admission: EM | Admit: 2016-12-16 | Discharge: 2016-12-16 | Disposition: A | Discharge status: Home / Self Care | Payer: PPO | Attending: Emergency Medicine | Admitting: Emergency Medicine

## 2016-12-16 VITALS — BP 126/80 | HR 74 | Temp 97.5°F | Resp 16 | Ht 64.0 in | Wt 166.0 lb

## 2016-12-16 DIAGNOSIS — Z5321 Procedure and treatment not carried out due to patient leaving prior to being seen by health care provider: Secondary | ICD-10-CM | POA: Diagnosis not present

## 2016-12-16 DIAGNOSIS — R4182 Altered mental status, unspecified: Secondary | ICD-10-CM | POA: Diagnosis not present

## 2016-12-16 DIAGNOSIS — R197 Diarrhea, unspecified: Secondary | ICD-10-CM

## 2016-12-16 DIAGNOSIS — R112 Nausea with vomiting, unspecified: Secondary | ICD-10-CM | POA: Diagnosis not present

## 2016-12-16 DIAGNOSIS — R109 Unspecified abdominal pain: Secondary | ICD-10-CM | POA: Diagnosis present

## 2016-12-16 LAB — COMPREHENSIVE METABOLIC PANEL
ALT: 29 U/L (ref 14–54)
AST: 52 U/L — ABNORMAL HIGH (ref 15–41)
Albumin: 3.7 g/dL (ref 3.5–5.0)
Alkaline Phosphatase: 57 U/L (ref 38–126)
Anion gap: 6 (ref 5–15)
BUN: 17 mg/dL (ref 6–20)
CO2: 30 mmol/L (ref 22–32)
Calcium: 8.8 mg/dL — ABNORMAL LOW (ref 8.9–10.3)
Chloride: 104 mmol/L (ref 101–111)
Creatinine, Ser: 0.56 mg/dL (ref 0.44–1.00)
GFR calc Af Amer: >60 mL/min (ref >60)
GFR calc non Af Amer: >60 mL/min (ref >60)
Glucose, Bld: 95 mg/dL (ref 65–99)
Potassium: 4.1 mmol/L (ref 3.5–5.1)
Sodium: 140 mmol/L (ref 135–145)
Total Bilirubin: 0.4 mg/dL (ref 0.3–1.2)
Total Protein: 7.2 g/dL (ref 6.5–8.1)

## 2016-12-16 LAB — CBC
HCT: 38.8 % (ref 36.0–46.0)
Hemoglobin: 12.7 g/dL (ref 12.0–15.0)
MCH: 31.1 pg (ref 26.0–34.0)
MCHC: 32.7 g/dL (ref 30.0–36.0)
MCV: 94.9 fL (ref 78.0–100.0)
Platelets: 222 10*3/uL (ref 150–400)
RBC: 4.09 MIL/uL (ref 3.87–5.11)
RDW: 13.1 % (ref 11.5–15.5)
WBC: 6.7 10*3/uL (ref 4.0–10.5)

## 2016-12-16 LAB — LIPASE, BLOOD: Lipase: 31 U/L (ref 11–51)

## 2016-12-16 NOTE — ED Notes (Signed)
Pt called from waiting room to fast track again with no answer

## 2016-12-16 NOTE — Progress Notes (Signed)
Subjective:    Patient ID: Jill Harrell, female    DOB: 1960/07/25, 56 y.o.   MRN: [id number]  HPI  10/15/16 The patient's symptoms began approximately 4 weeks ago with rhinorrhea, head congestion, postnasal drip. They have progressively worsened over the last 4 weeks to now constant left frontal sinus pain, left maxillary sinus pain, pain in the roof of her mouth, pain in her left ear. She also reports head congestion and inability to breathe through her nose. She reports postnasal drip leading to cough. She denies any shortness of breath or chest pain. She denies any fevers but she does report fatigue and generally feeling sick and run down. She is also requesting a refill on her Ritalin that she uses for chronic fatigue and depression. She is currently taking 10 mg a day. She denies any palpitations. She denies any chest pain. She denies any shortness of breath. She does report some increasing anxiety 2 months since buying a new home but she was on Ritalin long before that with no problems with anxiety.  At that time, my plan was: Patient history developed a secondary bacterial sinus infection. Begin Augmentin 875 mg by mouth twice a day for 10 days. I also gave the patient prescription for Diflucan 150 mg by mouth 1 should she develop a secondary yeast infection after taking Augmentin. I did refill her Reglan. She is to take 10 mg a day. I gave her 30 tablets with 2 refills. Recheck if no better in 1 week or sooner if worse.  10/25/16 Patient took 1 day worth of amoxicillin and then discontinued the medication because of a rash that developed that is now no longer present. Shortly thereafter she developed severe pain in her left ear which is still present and getting worse in addition to the sinus pressure and sinus pain that she already reported. The majority of her pain is located in the left frontal sinus, left maxillary sinus, and behind the left tympanic membrane. On examination today however  the left tympanic membrane appears healthy. Is pearly gray. There is no obvious effusion. There is no erythema or rash or vesicles. She continues to have significant tenderness to palpation in the left maxillary sinus and left frontal sinus. She took a Z-Pak which provided no relief.  At that time, my plan was: Patient's pain is out of proportion to exam. Therefore I'm not comfortable saying that this is simply a sinus infection. I will give the patient on Omnicef 300 mg by mouth twice a day with strict return precautions. If symptoms are not improving over the next 48-72 hours, I will proceed with a CT scan of the sinuses and head to rule out complications given her significant past medical history. Patient reports a "clear" MRI in July  12/16/16 I have not heard back from the patient today. She is here today requesting a medication for psoriasis, otezla.  However the patient can barely open her eyes. She falls asleep as she is talking to me. She staggers when she stands. She is extremely lethargic. She barely keeps her eyes open However she is adamant that she is not taking any medication today other than Cymbalta and Excedrin. She continues to complain of a severe headache.  Frequently during our encounter, she will stop speaking and close her eyes and will appear to nod off.  She complains of severe pain in her head although she appears extremely lethargic and frequently has to be aroused during our encounter. She appears over  medicated!  I brought her husband into the room and he states that he is monitoring her medication and that she's not taking too much medication.  Past Medical History:  Diagnosis Date  . Anxiety   . Arthritis    hip and wrist  . Brain cancer ([place])    2009 glioblastoma (stage 4) Dr. Bayard Hugger  . Cancer ([place])   . Seizures ([place])    Past Surgical History:  Procedure Laterality Date  . ABDOMINAL HYSTERECTOMY    . APPENDECTOMY    . BRAIN SURGERY X2    . CHOLECYSTECTOMY      Current Outpatient Prescriptions on File Prior to Visit  Medication Sig Dispense Refill  . ALPRAZolam (XANAX) 1 MG tablet Take 1 tablet (1 mg total) by mouth 3 (three) times daily as needed for anxiety. (Patient taking differently: Take 1 mg by mouth 3 (three) times daily as needed for anxiety. 2 tab po BID) 120 tablet 0  . azithromycin (ZITHROMAX) 250 MG tablet 2 tabs po x 1 day; 1 tab po qd x days 2-5 6 tablet 0  . budesonide-formoterol (SYMBICORT) 160-4.5 MCG/ACT inhaler Inhale 2 puffs into the lungs 2 (two) times daily. 1 Inhaler 11  . cefdinir (OMNICEF) 300 MG capsule Take 1 capsule (300 mg total) by mouth 2 (two) times daily. 20 capsule 0  . divalproex (DEPAKOTE) 500 MG DR tablet Take 3 tablets (1,500 mg total) by mouth at bedtime. 90 tablet 3  . DULoxetine (CYMBALTA) 60 MG capsule TAKE ONE CAPSULE BY MOUTH ONCE DAILY 90 capsule 3  . Estradiol (DIVIGEL) 1 MG/GM GEL Place 1 packet onto the skin daily. 30 g 11  . HYDROcodone-acetaminophen (NORCO) 10-325 MG tablet Take 1 tablet by mouth every 6 (six) hours as needed (pain). 120 tablet 0  . lubiprostone (AMITIZA) 24 MCG capsule Take 1 capsule (24 mcg total) by mouth 2 (two) times daily with a meal. 60 capsule 5  . methadone (DOLOPHINE) 10 MG tablet Take 1 tablet (10 mg total) by mouth every 8 (eight) hours. 90 tablet 0  . methylphenidate (RITALIN) 10 MG tablet Take 1 tablet (10 mg total) by mouth every morning. 30 tablet 0  . mirabegron ER (MYRBETRIQ) 50 MG TB24 tablet Take 50 mg by mouth daily.    . ondansetron (ZOFRAN) 8 MG tablet Take 1 tablet (8 mg total) by mouth every 8 (eight) hours as needed for nausea or vomiting. 90 tablet 3  . prochlorperazine (COMPAZINE) 10 MG tablet Take 1 tablet by mouth every 6 (six) hours.  2  . zolpidem (AMBIEN) 5 MG tablet Take 1 tablet (5 mg total) by mouth at bedtime as needed for sleep. 30 tablet 0   No current facility-administered medications on file prior to visit.    No Known Allergies Social  History   Social History  . Marital status: Married    Spouse name: N/A  . Number of children: N/A  . Years of education: N/A   Occupational History  . Not on file.   Social History Main Topics  . Smoking status: Current Every Day Smoker    Packs/day: 0.50    Years: 23.00    Types: Cigarettes  . Smokeless tobacco: Never Used  . Alcohol use No  . Drug use: No  . Sexual activity: Yes    Birth control/ protection: Other-see comments   Other Topics Concern  . Not on file   Social History Narrative  . No narrative on file  Review of Systems  All other systems reviewed and are negative.      Objective:   Physical Exam  Constitutional: She appears well-developed and well-nourished. She appears lethargic. She is sleeping and cooperative.  HENT:  Right Ear: Tympanic membrane, external ear and ear canal normal.  Left Ear: Tympanic membrane, external ear and ear canal normal.  Mouth/Throat: Oropharynx is clear and moist. No oropharyngeal exudate.  Neck: Neck supple.  Cardiovascular: Normal rate, regular rhythm and normal heart sounds.   Pulmonary/Chest: Effort normal and breath sounds normal. No respiratory distress. She has no wheezes. She has no rales.  Lymphadenopathy:    She has no cervical adenopathy.  Neurological: She appears lethargic.  Vitals reviewed.         Assessment & Plan:  AMS  Patient appears overmedicated and is possibly abusing her medication. However both the patient and her husband are adamant that she is not taking any medication today. She continues to complain of a severe headache. Therefore I recommended she go to the emergency room for imaging of the brain to rule out an intracranial hemorrhage despite no evidence of neuro deficit.  Husband will drive her there.  I will no longer prescribe her Ambien or Xanax as I feel that she is abusing the medication based on her appearance today I also recommended to her husband to discuss with her  pain management doctor her methadone and hydrocodone as again she appears overmedicated

## 2016-12-16 NOTE — ED Notes (Signed)
Called pt to come to fast track with no answer

## 2016-12-16 NOTE — ED Triage Notes (Signed)
Multiple symptoms, headache, vomiting, diarrhea, abdominal pain

## 2017-01-04 ENCOUNTER — Other Ambulatory Visit: Payer: PPO | Admitting: Obstetrics & Gynecology

## 2017-01-04 ENCOUNTER — Encounter: Payer: Self-pay | Admitting: Obstetrics & Gynecology

## 2017-01-05 ENCOUNTER — Other Ambulatory Visit: Payer: Self-pay | Admitting: Obstetrics & Gynecology

## 2017-01-11 DIAGNOSIS — G894 Chronic pain syndrome: Secondary | ICD-10-CM | POA: Diagnosis not present

## 2017-01-11 DIAGNOSIS — G4700 Insomnia, unspecified: Secondary | ICD-10-CM | POA: Diagnosis not present

## 2017-01-11 DIAGNOSIS — R110 Nausea: Secondary | ICD-10-CM | POA: Diagnosis not present

## 2017-01-11 DIAGNOSIS — F411 Generalized anxiety disorder: Secondary | ICD-10-CM | POA: Diagnosis not present

## 2017-01-11 DIAGNOSIS — C711 Malignant neoplasm of frontal lobe: Secondary | ICD-10-CM | POA: Diagnosis not present

## 2017-01-17 ENCOUNTER — Ambulatory Visit: Payer: PPO | Admitting: Family Medicine

## 2017-02-17 DIAGNOSIS — C711 Malignant neoplasm of frontal lobe: Secondary | ICD-10-CM | POA: Diagnosis not present

## 2017-02-17 DIAGNOSIS — G4700 Insomnia, unspecified: Secondary | ICD-10-CM | POA: Diagnosis not present

## 2017-02-17 DIAGNOSIS — G894 Chronic pain syndrome: Secondary | ICD-10-CM | POA: Diagnosis not present

## 2017-02-17 DIAGNOSIS — F411 Generalized anxiety disorder: Secondary | ICD-10-CM | POA: Diagnosis not present

## 2017-03-21 DIAGNOSIS — G4700 Insomnia, unspecified: Secondary | ICD-10-CM | POA: Diagnosis not present

## 2017-03-21 DIAGNOSIS — G894 Chronic pain syndrome: Secondary | ICD-10-CM | POA: Diagnosis not present

## 2017-03-21 DIAGNOSIS — C711 Malignant neoplasm of frontal lobe: Secondary | ICD-10-CM | POA: Diagnosis not present

## 2017-03-23 ENCOUNTER — Other Ambulatory Visit: Payer: Self-pay | Admitting: Family Medicine

## 2017-03-23 DIAGNOSIS — F411 Generalized anxiety disorder: Secondary | ICD-10-CM

## 2017-03-23 MED ORDER — OMEPRAZOLE 20 MG PO CPDR: 20.0000 mg | DELAYED_RELEASE_CAPSULE | Freq: Every day | ORAL | 3 refills | Status: DC

## 2017-03-23 MED ORDER — DULOXETINE HCL 60 MG PO CPEP: 60.0000 mg | ORAL_CAPSULE | Freq: Every day | ORAL | 3 refills | Status: DC

## 2017-04-05 ENCOUNTER — Other Ambulatory Visit: Payer: PPO | Admitting: Obstetrics & Gynecology

## 2017-04-12 ENCOUNTER — Encounter: Payer: Self-pay | Admitting: Obstetrics & Gynecology

## 2017-04-12 ENCOUNTER — Ambulatory Visit (INDEPENDENT_AMBULATORY_CARE_PROVIDER_SITE_OTHER): Payer: Medicare Other | Admitting: Obstetrics & Gynecology

## 2017-04-12 ENCOUNTER — Other Ambulatory Visit: Payer: Self-pay

## 2017-04-12 VITALS — BP 112/66 | HR 84 | Resp 20 | Ht 63.0 in | Wt 165.0 lb

## 2017-04-12 DIAGNOSIS — Z1211 Encounter for screening for malignant neoplasm of colon: Secondary | ICD-10-CM

## 2017-04-12 DIAGNOSIS — Z1212 Encounter for screening for malignant neoplasm of rectum: Secondary | ICD-10-CM | POA: Diagnosis not present

## 2017-04-12 DIAGNOSIS — Z01419 Encounter for gynecological examination (general) (routine) without abnormal findings: Secondary | ICD-10-CM | POA: Diagnosis not present

## 2017-04-12 NOTE — Progress Notes (Signed)
Subjective:     Jill Harrell is a 57 y.o. female here for a routine exam.  No LMP recorded. Patient has had a hysterectomy. [id number] Birth Control Method:  hysterectomy Menstrual Calendar(currently):   Current complaints: vaginal dryness with intercourse.   Current acute medical issues:  Hx of glioblastoma   Recent Gynecologic History No LMP recorded. Patient has had a hysterectomy. Last Pap: n/a,   Last mammogram: 2017,  normal  Past Medical History:  Diagnosis Date  . Anxiety   . Arthritis    hip and wrist  . 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 Bayard Hugger  . Cancer ([place])   . Seizures ([place])     Past Surgical History:  Procedure Laterality Date  . ABDOMINAL HYSTERECTOMY    . APPENDECTOMY    . BRAIN SURGERY X2    . CHOLECYSTECTOMY      OB History    Gravida Para Term Preterm AB Living   4 0     4 0   SAB TAB Ectopic Multiple Live Births   4              Social History   Socioeconomic History  . Marital status: Married    Spouse name: None  . Number of children: None  . Years of education: None  . Highest education level: None  Social Needs  . Financial resource strain: None  . Food insecurity - worry: None  . Food insecurity - inability: None  . Transportation needs - medical: None  . Transportation needs - non-medical: None  Occupational History  . None  Tobacco Use  . Smoking status: Current Every Day Smoker    Packs/day: 0.50    Years: 23.00    Pack years: 11.50    Types: Cigarettes  . Smokeless tobacco: Never Used  Substance and Sexual Activity  . Alcohol use: No    Alcohol/week: 0.0 oz  . Drug use: No  . Sexual activity: Yes    Birth control/protection: Other-see comments  Other Topics Concern  . None  Social History Narrative  . None    Family History  Adopted: Yes  Problem Relation Age of Onset  . Heart disease Father   . Hypotension Father   . Diabetes Father   . Allergic rhinitis Father   . Hyperlipidemia  Maternal Grandfather   . Hypertension Maternal Grandfather   . Heart disease Maternal Grandfather   . Allergic rhinitis Maternal Grandfather   . Allergic rhinitis Mother   . Allergic rhinitis Maternal Grandmother   . Lymphoma Maternal Grandmother   . Asthma Brother   . Allergic rhinitis Brother   . Asthma Brother   . Allergic rhinitis Brother   . COPD Brother   . Allergic rhinitis Brother   . Angioedema Neg Hx   . Atopy Neg Hx   . Eczema Neg Hx   . Immunodeficiency Neg Hx   . Urticaria Neg Hx   . Colon cancer Neg Hx      Current Outpatient Medications:  .  budesonide-formoterol (SYMBICORT) 160-4.5 MCG/ACT inhaler, Inhale 2 puffs into the lungs 2 (two) times daily., Disp: 1 Inhaler, Rfl: 11 .  divalproex (DEPAKOTE) 500 MG DR tablet, Take 3 tablets (1,500 mg total) by mouth at bedtime. (Patient taking differently: Take 1,000 mg by mouth at bedtime. ), Disp: 90 tablet, Rfl: 3 .  DULoxetine (CYMBALTA) 60 MG capsule, Take 1 capsule (60 mg total) by mouth daily., Disp: 90 capsule, Rfl:  3 .  Estradiol (DIVIGEL) 1 MG/GM GEL, Place 1 packet onto the skin daily., Disp: 30 g, Rfl: 11 .  HYDROcodone-acetaminophen (NORCO) 10-325 MG tablet, Take 1 tablet by mouth every 6 (six) hours as needed (pain)., Disp: 120 tablet, Rfl: 0 .  lubiprostone (AMITIZA) 24 MCG capsule, Take 1 capsule (24 mcg total) by mouth 2 (two) times daily with a meal., Disp: 60 capsule, Rfl: 5 .  methadone (DOLOPHINE) 10 MG tablet, Take 1 tablet (10 mg total) by mouth every 8 (eight) hours., Disp: 90 tablet, Rfl: 0 .  MYRBETRIQ 50 MG TB24 tablet, TAKE ONE TABLET BY MOUTH ONCE DAILY, Disp: 30 tablet, Rfl: 11 .  omeprazole (PRILOSEC) 20 MG capsule, Take 1 capsule (20 mg total) by mouth daily., Disp: 90 capsule, Rfl: 3 .  ondansetron (ZOFRAN) 8 MG tablet, Take 1 tablet (8 mg total) by mouth every 8 (eight) hours as needed for nausea or vomiting., Disp: 90 tablet, Rfl: 3 .  prochlorperazine (COMPAZINE) 10 MG tablet, Take 1 tablet  by mouth every 6 (six) hours., Disp: , Rfl: 2 .  zolpidem (AMBIEN) 10 MG tablet, TK 1 T PO QD HS, Disp: , Rfl: 0  Review of Systems  Review of Systems  Constitutional: Negative for fever, chills, weight loss, malaise/fatigue and diaphoresis.  HENT: Negative for hearing loss, ear pain, nosebleeds, congestion, sore throat, neck pain, tinnitus and ear discharge.   Eyes: Negative for blurred vision, double vision, photophobia, pain, discharge and redness.  Respiratory: Negative for cough, hemoptysis, sputum production, shortness of breath, wheezing and stridor.   Cardiovascular: Negative for chest pain, palpitations, orthopnea, claudication, leg swelling and PND.  Gastrointestinal: negative for abdominal pain. Negative for heartburn, nausea, vomiting, diarrhea, constipation, blood in stool and melena.  Genitourinary: Negative for dysuria, urgency, frequency, hematuria and flank pain.  Musculoskeletal: Negative for myalgias, back pain, joint pain and falls.  Skin: Negative for itching and rash.  Neurological: Negative for dizziness, tingling, tremors, sensory change, speech change, focal weakness, seizures, loss of consciousness, weakness and headaches.  Endo/Heme/Allergies: Negative for environmental allergies and polydipsia. Does not bruise/bleed easily.  Psychiatric/Behavioral: Negative for depression, suicidal ideas, hallucinations, memory loss and substance abuse. The patient is not nervous/anxious and does not have insomnia.        Objective:  Blood pressure 112/66, pulse 84, resp. rate 20, height 5\' 3"  (1.6 m), weight 165 lb (74.8 kg).   Physical Exam  Vitals reviewed. Constitutional: She is oriented to person, place, and time. She appears well-developed and well-nourished.  HENT:  Head: Normocephalic and atraumatic.        Right Ear: External ear normal.  Left Ear: External ear normal.  Nose: Nose normal.  Mouth/Throat: Oropharynx is clear and moist.  Eyes: Conjunctivae and EOM are  normal. Pupils are equal, round, and reactive to light. Right eye exhibits no discharge. Left eye exhibits no discharge. No scleral icterus.  Neck: Normal range of motion. Neck supple. No tracheal deviation present. No thyromegaly present.  Cardiovascular: Normal rate, regular rhythm, normal heart sounds and intact distal pulses.  Exam reveals no gallop and no friction rub.   No murmur heard. Respiratory: Effort normal and breath sounds normal. No respiratory distress. She has no wheezes. She has no rales. She exhibits no tenderness.  GI: Soft. Bowel sounds are normal. She exhibits no distension and no mass. There is no tenderness. There is no rebound and no guarding.  Genitourinary:  Breasts no masses skin changes or nipple changes bilaterally  Vulva is normal without lesions Vagina is pink moist without discharge Cervix absent Uterus is absent Adnexa is negative {Rectal    hemoccult negative, normal tone, no masses  Musculoskeletal: Normal range of motion. She exhibits no edema and no tenderness.  Neurological: She is alert and oriented to person, place, and time. She has normal reflexes. She displays normal reflexes. No cranial nerve deficit. She exhibits normal muscle tone. Coordination normal.  Skin: Skin is warm and dry. No rash noted. No erythema. No pallor.  Psychiatric: She has a normal mood and affect. Her behavior is normal. Judgment and thought content normal.       Medications Ordered at today's visit: No orders of the defined types were placed in this encounter.   Other orders placed at today's visit: No orders of the defined types were placed in this encounter.     Assessment:    Normal female exam.    Plan:    Mammogram ordered. Follow up in: 2 years.     Return in about 2 years (around 04/13/2019) for Follow up, with Dr Elonda Husky.

## 2017-04-19 DIAGNOSIS — G894 Chronic pain syndrome: Secondary | ICD-10-CM | POA: Diagnosis not present

## 2017-04-19 DIAGNOSIS — G4700 Insomnia, unspecified: Secondary | ICD-10-CM | POA: Diagnosis not present

## 2017-04-19 DIAGNOSIS — C711 Malignant neoplasm of frontal lobe: Secondary | ICD-10-CM | POA: Diagnosis not present

## 2017-04-19 DIAGNOSIS — R110 Nausea: Secondary | ICD-10-CM | POA: Diagnosis not present

## 2017-04-21 ENCOUNTER — Other Ambulatory Visit: Payer: Self-pay | Admitting: *Deleted

## 2017-04-22 MED ORDER — ESTRADIOL 1 MG/GM TD GEL: 1.0000 | Freq: Every day | TRANSDERMAL | 11 refills | Status: DC

## 2017-05-04 ENCOUNTER — Telehealth: Payer: Self-pay | Admitting: *Deleted

## 2017-05-04 DIAGNOSIS — N6002 Solitary cyst of left breast: Secondary | ICD-10-CM

## 2017-05-04 NOTE — Telephone Encounter (Signed)
Patient states Radiology says U/S  Orders are expired. Will resend.

## 2017-05-05 ENCOUNTER — Telehealth: Payer: Self-pay | Admitting: *Deleted

## 2017-05-05 NOTE — Telephone Encounter (Signed)
LMOVM that diagnostic mammogram is scheduled for 3/19 @ 1:40, to arrive at 1:30. No lotions, powders or deodorants.  Number given to radiology if she needs to cancel apponitment.

## 2017-05-09 ENCOUNTER — Other Ambulatory Visit: Payer: Self-pay | Admitting: Family Medicine

## 2017-05-09 DIAGNOSIS — R928 Other abnormal and inconclusive findings on diagnostic imaging of breast: Secondary | ICD-10-CM

## 2017-05-10 DIAGNOSIS — G4700 Insomnia, unspecified: Secondary | ICD-10-CM | POA: Diagnosis not present

## 2017-05-10 DIAGNOSIS — G894 Chronic pain syndrome: Secondary | ICD-10-CM | POA: Diagnosis not present

## 2017-05-10 DIAGNOSIS — C711 Malignant neoplasm of frontal lobe: Secondary | ICD-10-CM | POA: Diagnosis not present

## 2017-05-12 ENCOUNTER — Other Ambulatory Visit: Payer: Self-pay | Admitting: Gastroenterology

## 2017-05-13 NOTE — Telephone Encounter (Signed)
Patient needs ov within six months to continue to get refills.

## 2017-05-16 ENCOUNTER — Encounter: Payer: Self-pay | Admitting: Internal Medicine

## 2017-05-16 DIAGNOSIS — R51 Headache: Secondary | ICD-10-CM | POA: Diagnosis not present

## 2017-05-16 DIAGNOSIS — C711 Malignant neoplasm of frontal lobe: Secondary | ICD-10-CM | POA: Diagnosis not present

## 2017-05-16 DIAGNOSIS — Z79899 Other long term (current) drug therapy: Secondary | ICD-10-CM | POA: Diagnosis not present

## 2017-05-16 NOTE — Telephone Encounter (Signed)
PATIENT SCHEDULED AND LETTER SENT  °

## 2017-05-17 ENCOUNTER — Encounter (HOSPITAL_COMMUNITY): Payer: PPO

## 2017-05-17 ENCOUNTER — Ambulatory Visit (HOSPITAL_COMMUNITY)
Admission: RE | Admit: 2017-05-17 | Discharge: 2017-05-17 | Disposition: A | Discharge status: Home / Self Care | Payer: Medicare Other | Source: Ambulatory Visit | Attending: Obstetrics & Gynecology | Admitting: Obstetrics & Gynecology

## 2017-05-17 DIAGNOSIS — R922 Inconclusive mammogram: Secondary | ICD-10-CM | POA: Diagnosis not present

## 2017-05-17 DIAGNOSIS — N6002 Solitary cyst of left breast: Secondary | ICD-10-CM | POA: Diagnosis not present

## 2017-06-07 ENCOUNTER — Other Ambulatory Visit: Payer: Self-pay | Admitting: Family Medicine

## 2017-06-07 ENCOUNTER — Ambulatory Visit: Payer: Medicare Other | Admitting: Allergy and Immunology

## 2017-06-07 DIAGNOSIS — F411 Generalized anxiety disorder: Secondary | ICD-10-CM

## 2017-06-13 DIAGNOSIS — C711 Malignant neoplasm of frontal lobe: Secondary | ICD-10-CM | POA: Diagnosis not present

## 2017-06-13 DIAGNOSIS — G894 Chronic pain syndrome: Secondary | ICD-10-CM | POA: Diagnosis not present

## 2017-06-13 DIAGNOSIS — G4700 Insomnia, unspecified: Secondary | ICD-10-CM | POA: Diagnosis not present

## 2017-07-11 DIAGNOSIS — C711 Malignant neoplasm of frontal lobe: Secondary | ICD-10-CM | POA: Diagnosis not present

## 2017-07-11 DIAGNOSIS — G894 Chronic pain syndrome: Secondary | ICD-10-CM | POA: Diagnosis not present

## 2017-07-11 DIAGNOSIS — G4700 Insomnia, unspecified: Secondary | ICD-10-CM | POA: Diagnosis not present

## 2017-07-25 DIAGNOSIS — K047 Periapical abscess without sinus: Secondary | ICD-10-CM | POA: Diagnosis not present

## 2017-08-01 ENCOUNTER — Telehealth: Payer: Self-pay | Admitting: *Deleted

## 2017-08-02 ENCOUNTER — Other Ambulatory Visit: Payer: Self-pay | Admitting: Obstetrics & Gynecology

## 2017-08-02 MED ORDER — FLUCONAZOLE 150 MG PO TABS: 150.0000 mg | ORAL_TABLET | Freq: Once | ORAL | 1 refills | Status: DC

## 2017-08-02 MED ORDER — FLUCONAZOLE 150 MG PO TABS: 150.0000 mg | ORAL_TABLET | Freq: Once | ORAL | 1 refills | Status: AC

## 2017-08-02 NOTE — Telephone Encounter (Signed)
done

## 2017-08-16 ENCOUNTER — Ambulatory Visit: Payer: PPO | Admitting: Gastroenterology

## 2017-08-17 DIAGNOSIS — G4700 Insomnia, unspecified: Secondary | ICD-10-CM | POA: Diagnosis not present

## 2017-08-17 DIAGNOSIS — G894 Chronic pain syndrome: Secondary | ICD-10-CM | POA: Diagnosis not present

## 2017-08-17 DIAGNOSIS — C711 Malignant neoplasm of frontal lobe: Secondary | ICD-10-CM | POA: Diagnosis not present

## 2017-08-17 DIAGNOSIS — R110 Nausea: Secondary | ICD-10-CM | POA: Diagnosis not present

## 2017-09-01 IMAGING — MR MR HUMERUS*R* WO/W CM
4 of 15 series · 9 of 40 positions shown · IV contrast (15ml Multihance)
Comparison: None

CLINICAL DATA: Focal swelling along the upper arm posterolaterally.
Limited range of motion. Pain weakness for several months. Possible
triceps tendon injury.

EXAM:
MRI OF THE RIGHT HUMERUS WITHOUT AND WITH CONTRAST
TECHNIQUE: Multiplanar, multisequence MR imaging was performed both before and
after administration of intravenous contrast.
CONTRAST:  15 cc MultiHance

[Series 4: T1 · coronal · 4.0mm · 0.55mm/px · 3 of 30 slices shown (1 of 2)]
[im 1/30]
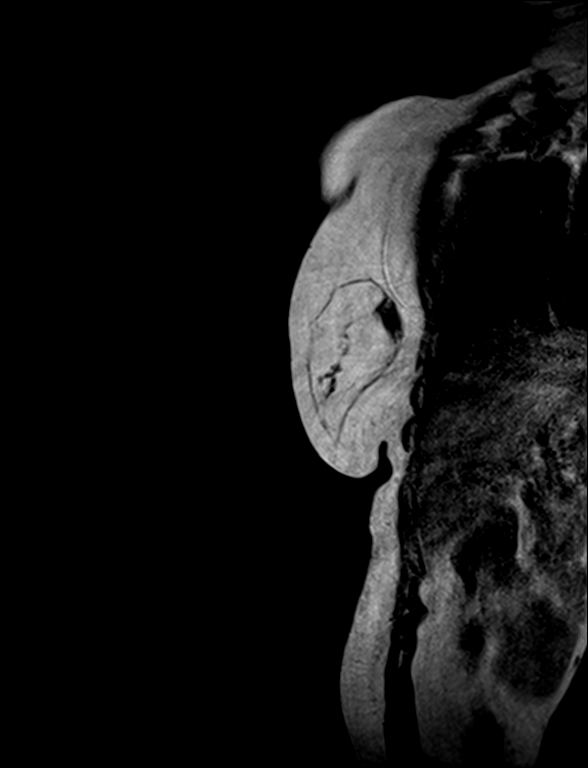
[im 15/30]
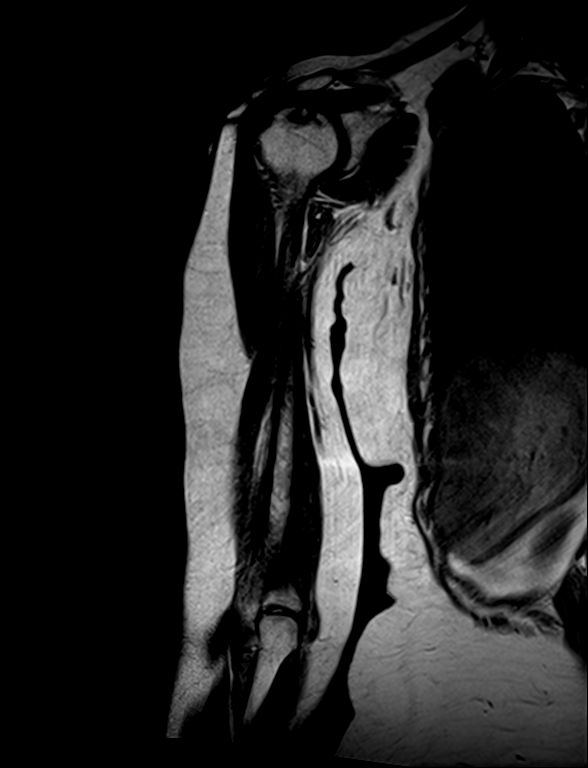
[im 30/30]
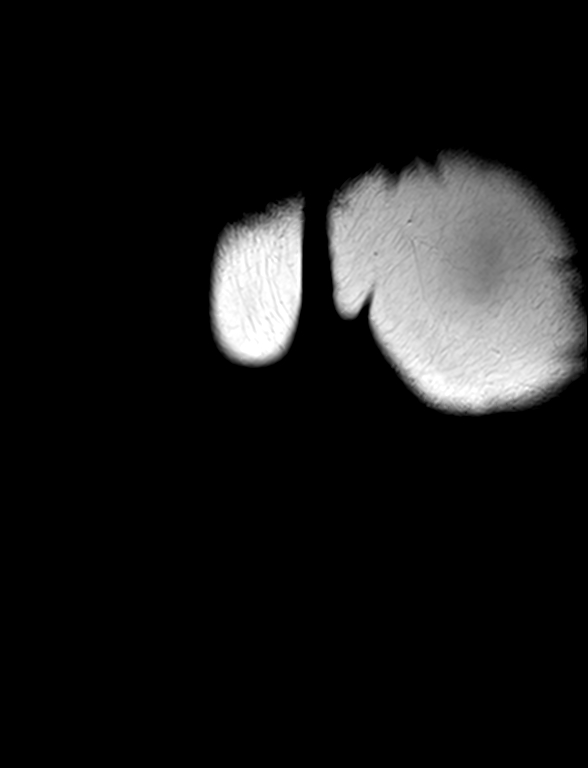

[Series 6: T2 · coronal · 4.0mm · 0.55mm/px · 3 of 30 slices shown (1 of 2)]
[im 1/30]
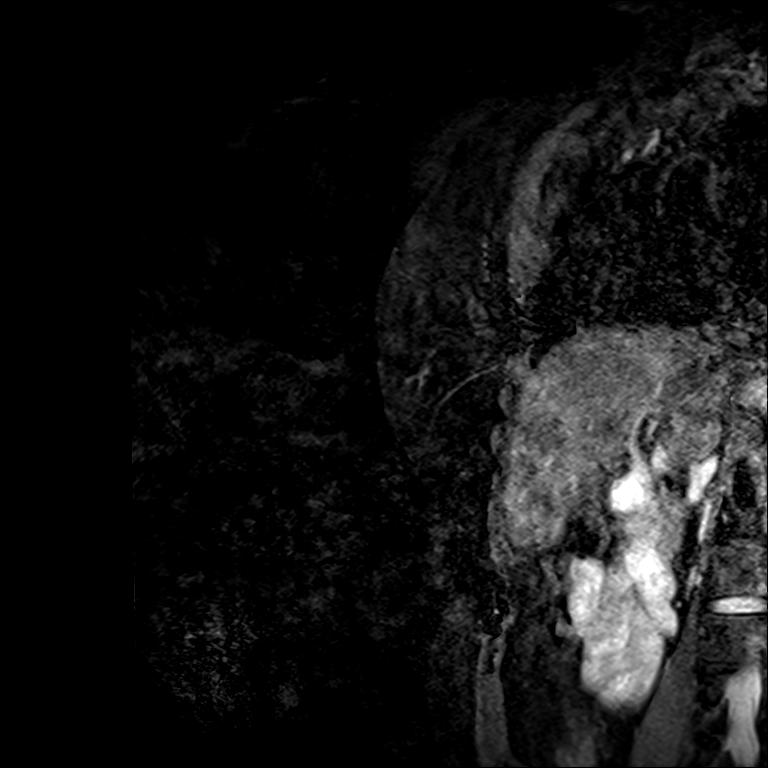
[im 15/30]
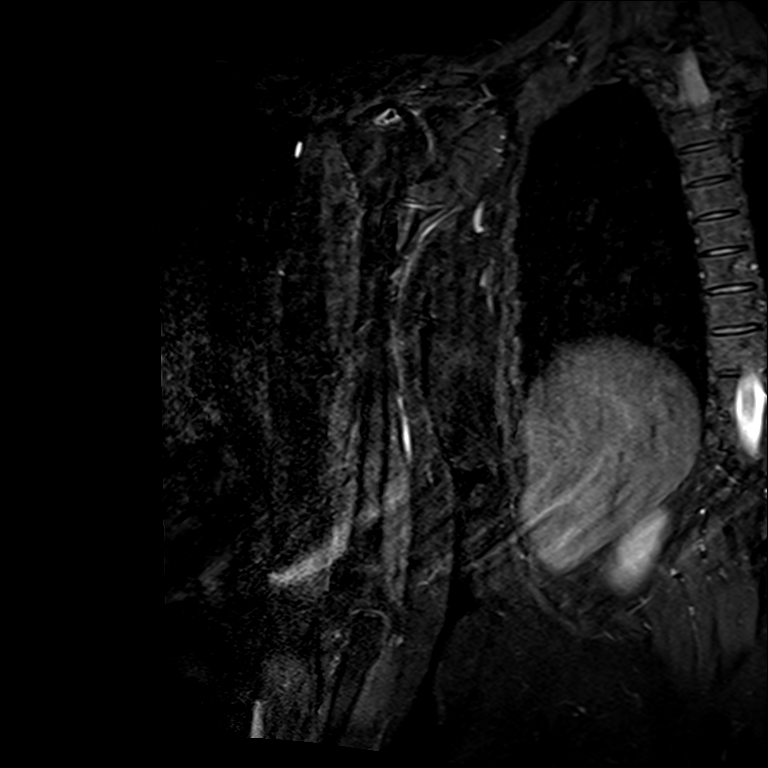
[im 30/30]
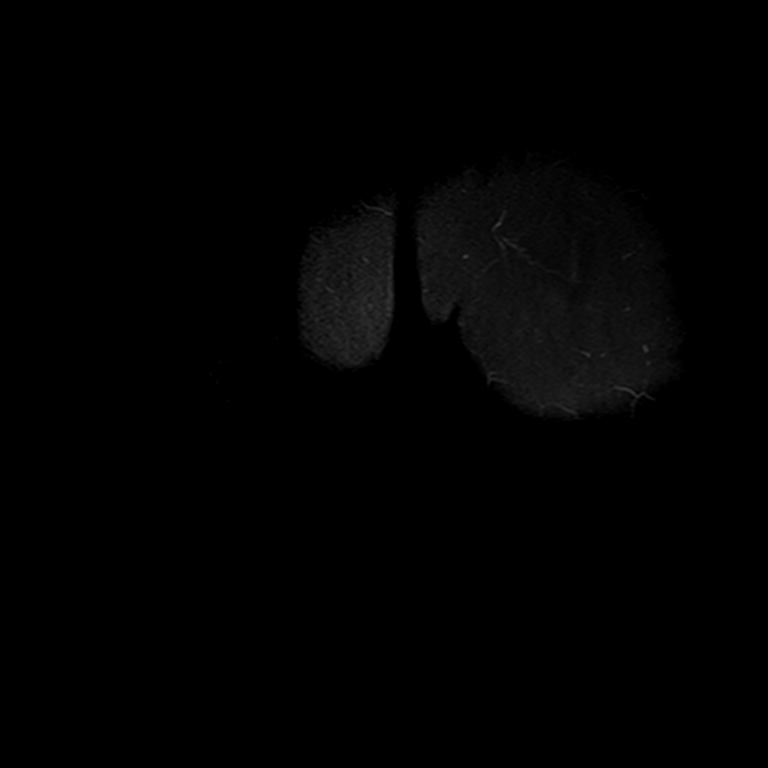

[Series 7: T1 · sagittal · 4.0mm · 0.41mm/px · 2 of 30 slices shown (2 of 2)]
[im 1/30]
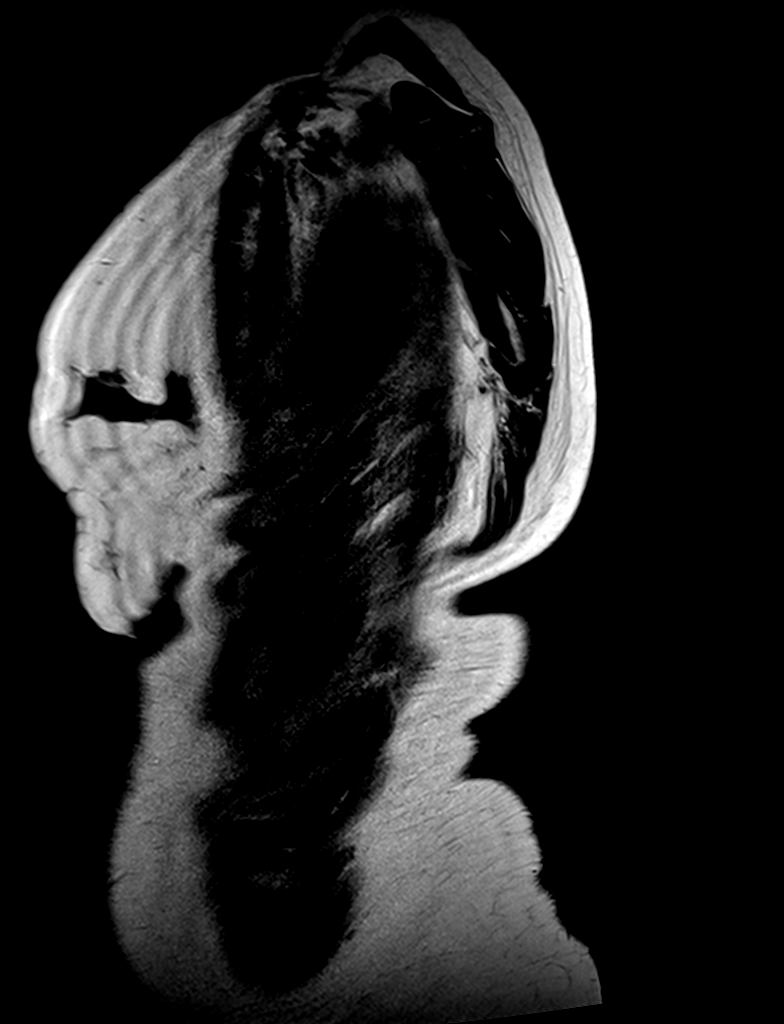
[im 30/30]
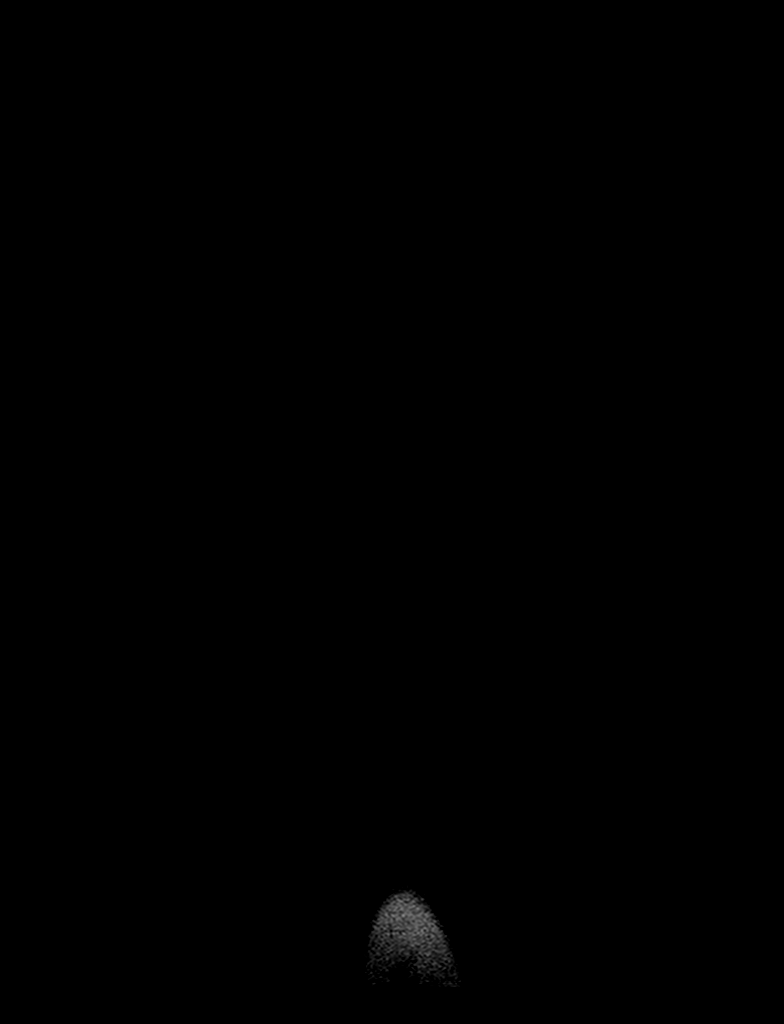

[Series 8: T2 · sagittal · 4.0mm · 0.55mm/px · 1 of 30 slices shown (2 of 2)]
[im 1/30]
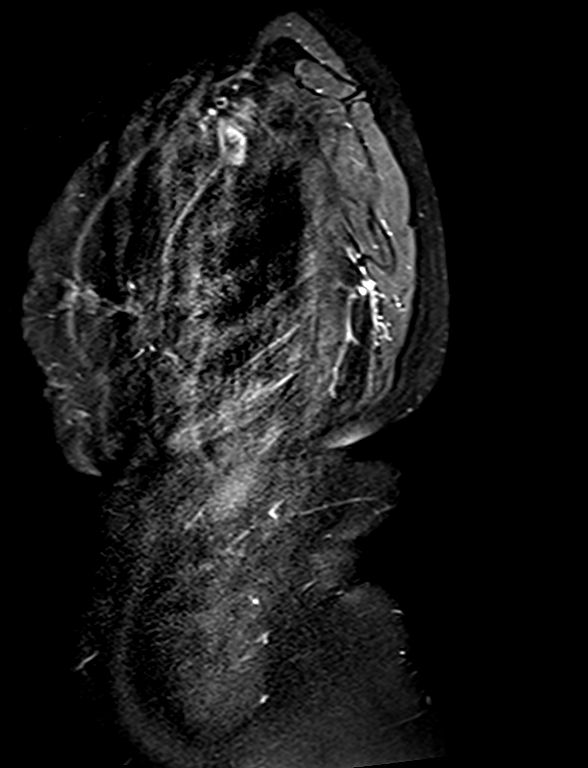

[9 of 40 positions shown; findings below may reference images not displayed]

FINDINGS: Despite efforts by the technologist and patient, motion artifact is
present on today's exam and could not be eliminated. This reduces
exam sensitivity and specificity.

There is evidence of chronic avascular necrosis in the humeral head,
as on image 16 series 7, correlate with risk factors. There is
potentially some rotator cuff tendinopathy.

The long, medial, and lateral heads of the triceps appear normal.
Deltoid unremarkable. Biceps intact.

No specific mass or abnormal enhancement is identified in the area
of concern along the upper lateral humerus. There is some prominence
of adipose tissue in the subcutaneous tissues along parts of the
humerus, and the possibility of lipoma obscured in the subcutaneous
adipose tissue is not excluded.
IMPRESSION: 1. No visible mass. Subcutaneous lipoma can be occult on MRI. No
triceps injury.
2. Chronic appearing avascular necrosis in the right humeral head.
3. Suspected supraspinatus tendinopathy.

## 2017-09-13 DIAGNOSIS — C711 Malignant neoplasm of frontal lobe: Secondary | ICD-10-CM | POA: Diagnosis not present

## 2017-09-13 DIAGNOSIS — G894 Chronic pain syndrome: Secondary | ICD-10-CM | POA: Diagnosis not present

## 2017-09-13 DIAGNOSIS — G4700 Insomnia, unspecified: Secondary | ICD-10-CM | POA: Diagnosis not present

## 2017-09-15 DIAGNOSIS — M546 Pain in thoracic spine: Secondary | ICD-10-CM | POA: Diagnosis not present

## 2017-09-15 DIAGNOSIS — M5441 Lumbago with sciatica, right side: Secondary | ICD-10-CM | POA: Diagnosis not present

## 2017-09-15 DIAGNOSIS — M9905 Segmental and somatic dysfunction of pelvic region: Secondary | ICD-10-CM | POA: Diagnosis not present

## 2017-09-15 DIAGNOSIS — M9902 Segmental and somatic dysfunction of thoracic region: Secondary | ICD-10-CM | POA: Diagnosis not present

## 2017-09-15 DIAGNOSIS — M9903 Segmental and somatic dysfunction of lumbar region: Secondary | ICD-10-CM | POA: Diagnosis not present

## 2017-09-19 DIAGNOSIS — M5441 Lumbago with sciatica, right side: Secondary | ICD-10-CM | POA: Diagnosis not present

## 2017-09-19 DIAGNOSIS — M9902 Segmental and somatic dysfunction of thoracic region: Secondary | ICD-10-CM | POA: Diagnosis not present

## 2017-09-19 DIAGNOSIS — M546 Pain in thoracic spine: Secondary | ICD-10-CM | POA: Diagnosis not present

## 2017-09-19 DIAGNOSIS — M9903 Segmental and somatic dysfunction of lumbar region: Secondary | ICD-10-CM | POA: Diagnosis not present

## 2017-09-19 DIAGNOSIS — M9905 Segmental and somatic dysfunction of pelvic region: Secondary | ICD-10-CM | POA: Diagnosis not present

## 2017-09-29 DIAGNOSIS — M9905 Segmental and somatic dysfunction of pelvic region: Secondary | ICD-10-CM | POA: Diagnosis not present

## 2017-09-29 DIAGNOSIS — M5441 Lumbago with sciatica, right side: Secondary | ICD-10-CM | POA: Diagnosis not present

## 2017-09-29 DIAGNOSIS — M9903 Segmental and somatic dysfunction of lumbar region: Secondary | ICD-10-CM | POA: Diagnosis not present

## 2017-09-29 DIAGNOSIS — M9902 Segmental and somatic dysfunction of thoracic region: Secondary | ICD-10-CM | POA: Diagnosis not present

## 2017-09-29 DIAGNOSIS — M546 Pain in thoracic spine: Secondary | ICD-10-CM | POA: Diagnosis not present

## 2017-10-13 DIAGNOSIS — G894 Chronic pain syndrome: Secondary | ICD-10-CM | POA: Diagnosis not present

## 2017-10-13 DIAGNOSIS — G4700 Insomnia, unspecified: Secondary | ICD-10-CM | POA: Diagnosis not present

## 2017-10-13 DIAGNOSIS — C711 Malignant neoplasm of frontal lobe: Secondary | ICD-10-CM | POA: Diagnosis not present

## 2017-10-28 DIAGNOSIS — K581 Irritable bowel syndrome with constipation: Secondary | ICD-10-CM | POA: Diagnosis not present

## 2017-10-28 DIAGNOSIS — R110 Nausea: Secondary | ICD-10-CM | POA: Diagnosis not present

## 2017-10-28 DIAGNOSIS — N3281 Overactive bladder: Secondary | ICD-10-CM | POA: Diagnosis not present

## 2017-10-28 DIAGNOSIS — G8929 Other chronic pain: Secondary | ICD-10-CM | POA: Diagnosis not present

## 2017-10-28 DIAGNOSIS — C719 Malignant neoplasm of brain, unspecified: Secondary | ICD-10-CM | POA: Diagnosis not present

## 2017-10-28 DIAGNOSIS — G4700 Insomnia, unspecified: Secondary | ICD-10-CM | POA: Diagnosis not present

## 2017-11-01 DIAGNOSIS — M5136 Other intervertebral disc degeneration, lumbar region: Secondary | ICD-10-CM | POA: Diagnosis not present

## 2017-11-01 DIAGNOSIS — M545 Low back pain: Secondary | ICD-10-CM | POA: Diagnosis not present

## 2017-11-10 DIAGNOSIS — M5416 Radiculopathy, lumbar region: Secondary | ICD-10-CM | POA: Diagnosis not present

## 2017-11-16 DIAGNOSIS — G4700 Insomnia, unspecified: Secondary | ICD-10-CM | POA: Diagnosis not present

## 2017-11-16 DIAGNOSIS — C711 Malignant neoplasm of frontal lobe: Secondary | ICD-10-CM | POA: Diagnosis not present

## 2017-11-16 DIAGNOSIS — G894 Chronic pain syndrome: Secondary | ICD-10-CM | POA: Diagnosis not present

## 2017-11-16 DIAGNOSIS — Z79899 Other long term (current) drug therapy: Secondary | ICD-10-CM | POA: Diagnosis not present

## 2017-12-05 DIAGNOSIS — M545 Low back pain: Secondary | ICD-10-CM | POA: Diagnosis not present

## 2017-12-14 DIAGNOSIS — Z23 Encounter for immunization: Secondary | ICD-10-CM | POA: Diagnosis not present

## 2017-12-14 DIAGNOSIS — G894 Chronic pain syndrome: Secondary | ICD-10-CM | POA: Diagnosis not present

## 2017-12-14 DIAGNOSIS — G4700 Insomnia, unspecified: Secondary | ICD-10-CM | POA: Diagnosis not present

## 2017-12-14 DIAGNOSIS — C711 Malignant neoplasm of frontal lobe: Secondary | ICD-10-CM | POA: Diagnosis not present

## 2018-01-18 DIAGNOSIS — C711 Malignant neoplasm of frontal lobe: Secondary | ICD-10-CM | POA: Diagnosis not present

## 2018-01-18 DIAGNOSIS — G894 Chronic pain syndrome: Secondary | ICD-10-CM | POA: Diagnosis not present

## 2018-01-18 DIAGNOSIS — G4700 Insomnia, unspecified: Secondary | ICD-10-CM | POA: Diagnosis not present

## 2018-02-10 DIAGNOSIS — H5213 Myopia, bilateral: Secondary | ICD-10-CM | POA: Diagnosis not present

## 2018-02-10 DIAGNOSIS — H524 Presbyopia: Secondary | ICD-10-CM | POA: Diagnosis not present

## 2018-02-10 DIAGNOSIS — H52221 Regular astigmatism, right eye: Secondary | ICD-10-CM | POA: Diagnosis not present

## 2018-02-15 DIAGNOSIS — G894 Chronic pain syndrome: Secondary | ICD-10-CM | POA: Diagnosis not present

## 2018-02-15 DIAGNOSIS — G4700 Insomnia, unspecified: Secondary | ICD-10-CM | POA: Diagnosis not present

## 2018-02-15 DIAGNOSIS — C711 Malignant neoplasm of frontal lobe: Secondary | ICD-10-CM | POA: Diagnosis not present

## 2018-02-16 ENCOUNTER — Encounter: Payer: Self-pay | Admitting: *Deleted

## 2018-02-16 ENCOUNTER — Encounter: Payer: Self-pay | Admitting: Gastroenterology

## 2018-02-16 ENCOUNTER — Ambulatory Visit: Payer: Medicare Other | Admitting: Gastroenterology

## 2018-02-16 ENCOUNTER — Other Ambulatory Visit: Payer: Self-pay | Admitting: *Deleted

## 2018-02-16 VITALS — BP 117/65 | HR 72 | Temp 97.0°F | Ht 64.0 in | Wt 168.4 lb

## 2018-02-16 DIAGNOSIS — K5900 Constipation, unspecified: Secondary | ICD-10-CM

## 2018-02-16 DIAGNOSIS — R1319 Other dysphagia: Secondary | ICD-10-CM

## 2018-02-16 DIAGNOSIS — R1310 Dysphagia, unspecified: Secondary | ICD-10-CM

## 2018-02-16 MED ORDER — LUBIPROSTONE 24 MCG PO CAPS: ORAL_CAPSULE | ORAL | 11 refills | Status: DC

## 2018-02-16 NOTE — Progress Notes (Signed)
Primary Care Physician: [name], MD  Primary Gastroenterologist:  Garfield Cornea, MD   Chief Complaint  Patient presents with  . Constipation    HPI: Jill Harrell is a 58 y.o. female here for follow-up of constipation.  She was last seen in November 2017.  Previous history of stage IV glioblastoma diagnosed in 2009.Initial surgery at that time followed by recurrence 18 months later requiring additional surgery. Patient states she has persistent tumor which is being followed and has been very stable over the years. She reports initially she was given 6 months to 1 year to live. Given her prior surgery she does have chronic headaches, history of seizures, has been on chronic pain medication for years.  Patient ran out of Amitiza 2 months ago.  She has been taking MiraLAX and tried her husband's Amitiza which was a lower dose but no good results.  Previously while on Amitiza 24 mcg twice daily she had a daily bowel movement although stools were [place] 1-2.  Denies melena or rectal bleeding.  She reports Cologuard negative in 2017, stating she finally got the copy directly from the company.  She has declined colonoscopies in the past and currently declines at this time.  She complains of nocturnal reflux, solid and pill dysphagia which appears to be worse in the evenings.  Recently increased her omeprazole to twice daily on her on to see if it would help.  No improvement.  Describes solid food sticking in her chest.  Sometimes when she takes her evening medicines it feels like the pills will go down well.  No vomiting.  No abdominal pain.  No melena or rectal bleeding.    Current Outpatient Medications  Medication Sig Dispense Refill  . budesonide-formoterol (SYMBICORT) 160-4.5 MCG/ACT inhaler Inhale 2 puffs into the lungs 2 (two) times daily. 1 Inhaler 11  . divalproex (DEPAKOTE) 500 MG DR tablet Take 3 tablets (1,500 mg total) by mouth at bedtime. (Patient taking  differently: Take 1,000 mg by mouth at bedtime. ) 90 tablet 3  . DULoxetine (CYMBALTA) 60 MG capsule Take 1 capsule (60 mg total) by mouth daily. 90 capsule 3  . DULoxetine (CYMBALTA) 60 MG capsule TAKE 1 CAPSULE BY MOUTH ONCE DAILY 90 capsule 3  . Estradiol (DIVIGEL) 1 MG/GM GEL Place 1 packet onto the skin daily. 30 g 11  . HYDROcodone-acetaminophen (NORCO) 10-325 MG tablet Take 1 tablet by mouth every 6 (six) hours as needed (pain). 120 tablet 0  . methadone (DOLOPHINE) 10 MG tablet Take 1 tablet (10 mg total) by mouth every 8 (eight) hours. 90 tablet 0  . omeprazole (PRILOSEC) 20 MG capsule Take 1 capsule (20 mg total) by mouth daily. 90 capsule 3  . ondansetron (ZOFRAN) 8 MG tablet Take 1 tablet (8 mg total) by mouth every 8 (eight) hours as needed for nausea or vomiting. 90 tablet 3  . prochlorperazine (COMPAZINE) 10 MG tablet Take 1 tablet by mouth every 6 (six) hours as needed.   2  . zolpidem (AMBIEN) 10 MG tablet TK 1 T PO QD HS  0  . AMITIZA 24 MCG capsule TAKE ONE CAPSULE BY MOUTH TWICE DAILY WITH  A  MEAL (Patient not taking: Reported on 02/16/2018) 60 capsule 5   No current facility-administered medications for this visit.     Allergies as of 02/16/2018  . (No Known Allergies)   Past Medical History:  Diagnosis Date  . Anxiety   . Arthritis  hip and wrist  . Brain cancer ([place])    2009 glioblastoma (stage 4) Dr. Bayard Hugger  . Cancer ([place])   . Seizures ([place])    Past Surgical History:  Procedure Laterality Date  . ABDOMINAL HYSTERECTOMY    . APPENDECTOMY    . BRAIN SURGERY X2    . CHOLECYSTECTOMY     Family History  Adopted: Yes  Problem Relation Age of Onset  . Heart disease Father   . Hypotension Father   . Diabetes Father   . Allergic rhinitis Father   . Hyperlipidemia Maternal Grandfather   . Hypertension Maternal Grandfather   . Heart disease Maternal Grandfather   . Allergic rhinitis Maternal Grandfather   . Allergic rhinitis Mother   . Allergic  rhinitis Maternal Grandmother   . Lymphoma Maternal Grandmother   . Asthma Brother   . Allergic rhinitis Brother   . Asthma Brother   . Allergic rhinitis Brother   . COPD Brother   . Allergic rhinitis Brother   . Angioedema Neg Hx   . Atopy Neg Hx   . Eczema Neg Hx   . Immunodeficiency Neg Hx   . Urticaria Neg Hx   . Colon cancer Neg Hx    Social History   Tobacco Use  . Smoking status: Current Every Day Smoker    Packs/day: 0.50    Years: 23.00    Pack years: 11.50    Types: Cigarettes  . Smokeless tobacco: Never Used  Substance Use Topics  . Alcohol use: Yes    Alcohol/week: 0.0 standard drinks    Comment: once a year  . Drug use: No    ROS:  General: Negative for anorexia, weight loss, fever, chills, fatigue, weakness. ENT: Negative for hoarseness, nasal congestion. See hpi CV: Negative for chest pain, angina, palpitations, dyspnea on exertion, peripheral edema.  Respiratory: Negative for dyspnea at rest, dyspnea on exertion, cough, sputum, wheezing.  GI: See history of present illness. GU:  Negative for dysuria, hematuria, urinary incontinence, urinary frequency, nocturnal urination.  Endo: Negative for unusual weight change.    Physical Examination:   BP 117/65   Pulse 72   Temp (!) 97 F (36.1 C) (Oral)   Ht 5\' 4"  (1.626 m)   Wt 168 lb 6.4 oz (76.4 kg)   BMI 28.91 kg/m   General: Well-nourished, well-developed in no acute distress.  Eyes: No icterus. Mouth: Oropharyngeal mucosa moist and pink , no lesions erythema or exudate. Lungs: Clear to auscultation bilaterally.  Heart: Regular rate and rhythm, no murmurs rubs or gallops.  Abdomen: Bowel sounds are normal, nontender, nondistended, no hepatosplenomegaly or masses, no abdominal bruits or hernia , no rebound or guarding.   Extremities: No lower extremity edema. No clubbing or deformities. Neuro: Alert and oriented x 4   Skin: Warm and dry, no jaundice.   Psych: Alert and cooperative, normal mood  and affect.

## 2018-02-16 NOTE — Assessment & Plan Note (Signed)
Solid food and pill dysphagia with refractory reflux symptoms.  Chronically on omeprazole 20 mg daily, recently increased to twice daily without improvement.  Recommend EGD with possible esophageal dilation in the near future.  Plan for deep sedation given polypharmacy.  I have discussed the risks, alternatives, benefits with regards to but not limited to the risk of reaction to medication, bleeding, infection, perforation and the patient is agreeable to proceed. Written consent to be obtained.

## 2018-02-16 NOTE — Patient Instructions (Signed)
1. Amitiza 4mcg twice daily with food for constipation. 2. Continue miralax once daily as needed to keep stools soft. 3. Upper endoscopy in near future. See separate instructions.  4. Continue colon cancer screening with Cologuard every 3 years or colonoscopy at any time you are ready.

## 2018-02-16 NOTE — Assessment & Plan Note (Signed)
Resume Amitiza 24 mcg twice daily with food.  Take MiraLAX 17 g daily as well to keep stools soft.  Discussed colon cancer screening.  She should have this done either via Cologuard every 3 years or consider colonoscopy at some point when she is ready.

## 2018-02-17 ENCOUNTER — Telehealth: Payer: Self-pay | Admitting: *Deleted

## 2018-02-17 NOTE — Progress Notes (Signed)
cc'ed to pcp °

## 2018-02-17 NOTE — Telephone Encounter (Signed)
Pre-op scheduled for 03/24/18 at 12:45pm. Letter mailed. LMTCB

## 2018-02-20 ENCOUNTER — Other Ambulatory Visit: Payer: Self-pay | Admitting: Obstetrics & Gynecology

## 2018-03-15 DIAGNOSIS — G4700 Insomnia, unspecified: Secondary | ICD-10-CM | POA: Diagnosis not present

## 2018-03-15 DIAGNOSIS — G894 Chronic pain syndrome: Secondary | ICD-10-CM | POA: Diagnosis not present

## 2018-03-15 DIAGNOSIS — F411 Generalized anxiety disorder: Secondary | ICD-10-CM | POA: Diagnosis not present

## 2018-03-15 DIAGNOSIS — C711 Malignant neoplasm of frontal lobe: Secondary | ICD-10-CM | POA: Diagnosis not present

## 2018-03-17 DIAGNOSIS — M546 Pain in thoracic spine: Secondary | ICD-10-CM | POA: Diagnosis not present

## 2018-03-17 DIAGNOSIS — M9905 Segmental and somatic dysfunction of pelvic region: Secondary | ICD-10-CM | POA: Diagnosis not present

## 2018-03-17 DIAGNOSIS — M545 Low back pain: Secondary | ICD-10-CM | POA: Diagnosis not present

## 2018-03-17 DIAGNOSIS — M9902 Segmental and somatic dysfunction of thoracic region: Secondary | ICD-10-CM | POA: Diagnosis not present

## 2018-03-17 DIAGNOSIS — M9903 Segmental and somatic dysfunction of lumbar region: Secondary | ICD-10-CM | POA: Diagnosis not present

## 2018-03-24 ENCOUNTER — Encounter (HOSPITAL_COMMUNITY): Payer: Self-pay

## 2018-03-24 ENCOUNTER — Other Ambulatory Visit: Payer: Self-pay

## 2018-03-24 ENCOUNTER — Ambulatory Visit (HOSPITAL_COMMUNITY)
Admission: RE | Admit: 2018-03-24 | Discharge: 2018-03-24 | Disposition: A | Discharge status: Home / Self Care | Payer: Medicare HMO | Source: Ambulatory Visit | Attending: Internal Medicine | Admitting: Internal Medicine

## 2018-03-24 DIAGNOSIS — Z01818 Encounter for other preprocedural examination: Secondary | ICD-10-CM | POA: Insufficient documentation

## 2018-03-24 HISTORY — DX: Other intervertebral disc degeneration, lumbar region: M51.36

## 2018-03-24 HISTORY — DX: Other intervertebral disc degeneration, lumbosacral region without mention of lumbar back pain or lower extremity pain: M51.379

## 2018-03-24 HISTORY — DX: Other intervertebral disc degeneration, lumbosacral region: M51.37

## 2018-03-24 HISTORY — DX: Gastro-esophageal reflux disease without esophagitis: K21.9

## 2018-03-24 LAB — CBC WITH DIFFERENTIAL/PLATELET
Abs Immature Granulocytes: 0.02 10*3/uL (ref 0.00–0.07)
Basophils Absolute: 0 10*3/uL (ref 0.0–0.1)
Basophils Relative: 0 %
Eosinophils Absolute: 0.2 10*3/uL (ref 0.0–0.5)
Eosinophils Relative: 3 %
HCT: 39.8 % (ref 36.0–46.0)
Hemoglobin: 12.7 g/dL (ref 12.0–15.0)
Immature Granulocytes: 0 %
Lymphocytes Relative: 36 %
Lymphs Abs: 2.5 10*3/uL (ref 0.7–4.0)
MCH: 29.7 pg (ref 26.0–34.0)
MCHC: 31.9 g/dL (ref 30.0–36.0)
MCV: 93.2 fL (ref 80.0–100.0)
Monocytes Absolute: 0.6 10*3/uL (ref 0.1–1.0)
Monocytes Relative: 8 %
Neutro Abs: 3.5 10*3/uL (ref 1.7–7.7)
Neutrophils Relative %: 53 %
Platelets: 238 10*3/uL (ref 150–400)
RBC: 4.27 MIL/uL (ref 3.87–5.11)
RDW: 13.4 % (ref 11.5–15.5)
WBC: 6.8 10*3/uL (ref 4.0–10.5)
nRBC: 0 % (ref 0.0–0.2)

## 2018-03-24 LAB — BASIC METABOLIC PANEL
Anion gap: 10 (ref 5–15)
BUN: 20 mg/dL (ref 6–20)
CO2: 25 mmol/L (ref 22–32)
Calcium: 8.8 mg/dL — ABNORMAL LOW (ref 8.9–10.3)
Chloride: 104 mmol/L (ref 98–111)
Creatinine, Ser: 0.63 mg/dL (ref 0.44–1.00)
GFR calc Af Amer: >60 mL/min (ref >60)
GFR calc non Af Amer: >60 mL/min (ref >60)
Glucose, Bld: 102 mg/dL — ABNORMAL HIGH (ref 70–99)
Potassium: 3.8 mmol/L (ref 3.5–5.1)
Sodium: 139 mmol/L (ref 135–145)

## 2018-03-28 ENCOUNTER — Telehealth: Payer: Self-pay

## 2018-03-28 NOTE — Telephone Encounter (Signed)
James at pre-service center called office. Pt is scheduled for EGD/DIL 03/30/18. Jeneen Rinks advised [employer] requires PA for [phone] (EGD) but not [id number] (DIL). PA for EGD done via [employer]. Case approved, PA# [id number], 03/30/18-04/29/18. LMOVM to inform Jeneen Rinks of [place].

## 2018-03-30 ENCOUNTER — Other Ambulatory Visit: Payer: Self-pay

## 2018-03-30 ENCOUNTER — Telehealth: Payer: Self-pay

## 2018-03-30 NOTE — Telephone Encounter (Signed)
Pt called office, she was unable to have EGD/DIL w/Propofol w/RMR today. States she was sick and had fever. Procedure rescheduled to 04/24/18 at 2:45pm. LMOVM for endo scheduler. New instructions mailed.

## 2018-03-31 NOTE — Telephone Encounter (Signed)
Endo scheduler advised pt will receive pre-op phone call 04/18/18. Tried to call pt, no answer, LMOVM and made note on her procedure instructions. Instructions mailed.

## 2018-04-17 DIAGNOSIS — J019 Acute sinusitis, unspecified: Secondary | ICD-10-CM | POA: Diagnosis not present

## 2018-04-17 DIAGNOSIS — F331 Major depressive disorder, recurrent, moderate: Secondary | ICD-10-CM | POA: Diagnosis not present

## 2018-04-17 DIAGNOSIS — Z8669 Personal history of other diseases of the nervous system and sense organs: Secondary | ICD-10-CM | POA: Diagnosis not present

## 2018-04-17 DIAGNOSIS — C719 Malignant neoplasm of brain, unspecified: Secondary | ICD-10-CM | POA: Diagnosis not present

## 2018-04-17 DIAGNOSIS — N3281 Overactive bladder: Secondary | ICD-10-CM | POA: Diagnosis not present

## 2018-04-17 DIAGNOSIS — G4700 Insomnia, unspecified: Secondary | ICD-10-CM | POA: Diagnosis not present

## 2018-04-17 DIAGNOSIS — F419 Anxiety disorder, unspecified: Secondary | ICD-10-CM | POA: Diagnosis not present

## 2018-04-17 DIAGNOSIS — G8929 Other chronic pain: Secondary | ICD-10-CM | POA: Diagnosis not present

## 2018-04-17 DIAGNOSIS — K581 Irritable bowel syndrome with constipation: Secondary | ICD-10-CM | POA: Diagnosis not present

## 2018-04-17 DIAGNOSIS — R110 Nausea: Secondary | ICD-10-CM | POA: Diagnosis not present

## 2018-04-18 ENCOUNTER — Encounter (HOSPITAL_COMMUNITY)
Admission: RE | Admit: 2018-04-18 | Discharge: 2018-04-18 | Disposition: A | Discharge status: Home / Self Care | Payer: Medicare HMO | Source: Ambulatory Visit | Attending: Internal Medicine | Admitting: Internal Medicine

## 2018-04-19 DIAGNOSIS — G4700 Insomnia, unspecified: Secondary | ICD-10-CM | POA: Diagnosis not present

## 2018-04-19 DIAGNOSIS — C711 Malignant neoplasm of frontal lobe: Secondary | ICD-10-CM | POA: Diagnosis not present

## 2018-04-19 DIAGNOSIS — R110 Nausea: Secondary | ICD-10-CM | POA: Diagnosis not present

## 2018-04-19 DIAGNOSIS — F411 Generalized anxiety disorder: Secondary | ICD-10-CM | POA: Diagnosis not present

## 2018-04-19 DIAGNOSIS — G894 Chronic pain syndrome: Secondary | ICD-10-CM | POA: Diagnosis not present

## 2018-04-20 ENCOUNTER — Other Ambulatory Visit: Payer: Self-pay | Admitting: Obstetrics & Gynecology

## 2018-04-24 ENCOUNTER — Ambulatory Visit (HOSPITAL_COMMUNITY): Payer: Medicare HMO | Admitting: Anesthesiology

## 2018-04-24 ENCOUNTER — Other Ambulatory Visit: Payer: Self-pay

## 2018-04-24 ENCOUNTER — Encounter (HOSPITAL_COMMUNITY)
Admission: RE | Disposition: A | Discharge status: Home / Self Care | Payer: Self-pay | Source: Home / Self Care | Attending: Internal Medicine

## 2018-04-24 ENCOUNTER — Encounter (HOSPITAL_COMMUNITY): Payer: Self-pay | Admitting: Emergency Medicine

## 2018-04-24 ENCOUNTER — Ambulatory Visit (HOSPITAL_COMMUNITY)
Admission: RE | Admit: 2018-04-24 | Discharge: 2018-04-24 | Disposition: A | Discharge status: Home / Self Care | Payer: Medicare HMO | Attending: Internal Medicine | Admitting: Internal Medicine

## 2018-04-24 DIAGNOSIS — K3189 Other diseases of stomach and duodenum: Secondary | ICD-10-CM | POA: Diagnosis not present

## 2018-04-24 DIAGNOSIS — C719 Malignant neoplasm of brain, unspecified: Secondary | ICD-10-CM | POA: Diagnosis not present

## 2018-04-24 DIAGNOSIS — F17200 Nicotine dependence, unspecified, uncomplicated: Secondary | ICD-10-CM | POA: Diagnosis not present

## 2018-04-24 DIAGNOSIS — R569 Unspecified convulsions: Secondary | ICD-10-CM | POA: Insufficient documentation

## 2018-04-24 DIAGNOSIS — K219 Gastro-esophageal reflux disease without esophagitis: Secondary | ICD-10-CM | POA: Insufficient documentation

## 2018-04-24 DIAGNOSIS — M19039 Primary osteoarthritis, unspecified wrist: Secondary | ICD-10-CM | POA: Insufficient documentation

## 2018-04-24 DIAGNOSIS — Z8249 Family history of ischemic heart disease and other diseases of the circulatory system: Secondary | ICD-10-CM | POA: Insufficient documentation

## 2018-04-24 DIAGNOSIS — R1314 Dysphagia, pharyngoesophageal phase: Secondary | ICD-10-CM | POA: Insufficient documentation

## 2018-04-24 DIAGNOSIS — F17210 Nicotine dependence, cigarettes, uncomplicated: Secondary | ICD-10-CM | POA: Diagnosis not present

## 2018-04-24 DIAGNOSIS — F419 Anxiety disorder, unspecified: Secondary | ICD-10-CM | POA: Insufficient documentation

## 2018-04-24 DIAGNOSIS — Z79899 Other long term (current) drug therapy: Secondary | ICD-10-CM | POA: Insufficient documentation

## 2018-04-24 DIAGNOSIS — K2950 Unspecified chronic gastritis without bleeding: Secondary | ICD-10-CM | POA: Insufficient documentation

## 2018-04-24 DIAGNOSIS — M1610 Unilateral primary osteoarthritis, unspecified hip: Secondary | ICD-10-CM | POA: Diagnosis not present

## 2018-04-24 DIAGNOSIS — R1319 Other dysphagia: Secondary | ICD-10-CM

## 2018-04-24 DIAGNOSIS — R1310 Dysphagia, unspecified: Secondary | ICD-10-CM | POA: Diagnosis not present

## 2018-04-24 HISTORY — PX: BIOPSY: SHX5522

## 2018-04-24 HISTORY — PX: ESOPHAGOGASTRODUODENOSCOPY (EGD) WITH PROPOFOL: SHX5813

## 2018-04-24 HISTORY — PX: MALONEY DILATION: SHX5535

## 2018-04-24 SURGERY — ESOPHAGOGASTRODUODENOSCOPY (EGD) WITH PROPOFOL
Anesthesia: Monitor Anesthesia Care

## 2018-04-24 MED ORDER — LACTATED RINGERS IV SOLN
INTRAVENOUS | Status: DC
  Administered 2018-04-24: 14:00:00 via INTRAVENOUS

## 2018-04-24 MED ORDER — CHLORHEXIDINE GLUCONATE CLOTH 2 % EX PADS: 6.0000 | MEDICATED_PAD | Freq: Once | CUTANEOUS | Status: DC

## 2018-04-24 MED ORDER — KETOROLAC TROMETHAMINE 30 MG/ML IJ SOLN
INTRAMUSCULAR | Status: AC
  Filled 2018-04-24: qty 1

## 2018-04-24 MED ORDER — PROPOFOL 10 MG/ML IV BOLUS
INTRAVENOUS | Status: DC | PRN
  Administered 2018-04-24: 40 mg via INTRAVENOUS
  Administered 2018-04-24: 20 mg via INTRAVENOUS

## 2018-04-24 MED ORDER — KETOROLAC TROMETHAMINE 30 MG/ML IJ SOLN
30.0000 mg | Freq: Once | INTRAMUSCULAR | Status: AC
  Administered 2018-04-24: 30 mg via INTRAVENOUS

## 2018-04-24 MED ORDER — PROPOFOL 500 MG/50ML IV EMUL
INTRAVENOUS | Status: DC | PRN
  Administered 2018-04-24: 200 ug/kg/min via INTRAVENOUS

## 2018-04-24 NOTE — H&P (Signed)
@LOGO @   Primary Care Physician:  Celene Squibb, MD Primary Gastroenterologist:  Dr. Gala Romney  Pre-Procedure History & Physical: HPI:  Jill Harrell is a 58 y.o. female here for further evaluation of intermittent esophageal dysphagia to solid food.  Reflux well controlled on omeprazole 20 mg daily.  Past Medical History:  Diagnosis Date  . Anxiety   . Arthritis    hip and wrist  . 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 Bayard Hugger  . Cancer ([place])   . Degenerative disc disease at L5-S1 level   . GERD (gastroesophageal reflux disease)   . Seizures ([place])    brain cancer    Past Surgical History:  Procedure Laterality Date  . ABDOMINAL HYSTERECTOMY    . APPENDECTOMY    . BRAIN SURGERY X2    . CHOLECYSTECTOMY      Prior to Admission medications   Medication Sig Start Date End Date Taking? Authorizing Provider  divalproex (DEPAKOTE) 500 MG DR tablet Take 3 tablets (1,500 mg total) by mouth at bedtime. Patient taking differently: Take 1,000 mg by mouth at bedtime.  04/21/15  Yes Penland, Kelby Fam, MD  DULoxetine (CYMBALTA) 60 MG capsule Take 1 capsule (60 mg total) by mouth daily. 03/23/17  Yes [name], MD  Estradiol (DIVIGEL) 1 MG/GM GEL Place 1 packet onto the skin daily. 04/22/17  Yes [name], MD  HYDROcodone-acetaminophen (NORCO) 10-325 MG tablet Take 1 tablet by mouth every 6 (six) hours as needed (pain). 05/23/15  Yes Kefalas, Manon Hilding, PA-C  lubiprostone (AMITIZA) 24 MCG capsule TAKE ONE CAPSULE BY MOUTH TWICE DAILY WITH  A  MEAL 02/16/18  Yes Mahala Menghini, PA-C  methadone (DOLOPHINE) 10 MG tablet Take 1 tablet (10 mg total) by mouth every 8 (eight) hours. 06/10/15  Yes Kefalas, Manon Hilding, PA-C  MYRBETRIQ 50 MG TB24 tablet TAKE 1 TABLET BY MOUTH ONCE DAILY 02/20/18  Yes [name], MD  omeprazole (PRILOSEC) 20 MG capsule Take 1 capsule (20 mg total) by mouth daily. 03/23/17  Yes [name], MD  ondansetron (ZOFRAN) 8 MG tablet Take 1  tablet (8 mg total) by mouth every 8 (eight) hours as needed for nausea or vomiting. 02/02/16  Yes [name], MD  prochlorperazine (COMPAZINE) 10 MG tablet Take 1 tablet by mouth every 6 (six) hours as needed.  03/09/16  Yes [provider]  zolpidem (AMBIEN) 10 MG tablet TK 1 T PO QD HS 03/21/17  Yes [provider]  budesonide-formoterol (SYMBICORT) 160-4.5 MCG/ACT inhaler Inhale 2 puffs into the lungs 2 (two) times daily. 10/15/16   [name], MD  DULoxetine (CYMBALTA) 60 MG capsule TAKE 1 CAPSULE BY MOUTH ONCE DAILY 06/07/17   [name], MD    Allergies as of 02/16/2018  . (No Known Allergies)    Family History  Adopted: Yes  Problem Relation Age of Onset  . Heart disease Father   . Hypotension Father   . Diabetes Father   . Allergic rhinitis Father   . Hyperlipidemia Maternal Grandfather   . Hypertension Maternal Grandfather   . Heart disease Maternal Grandfather   . Allergic rhinitis Maternal Grandfather   . Allergic rhinitis Mother   . Allergic rhinitis Maternal Grandmother   . Lymphoma Maternal Grandmother   . Asthma Brother   . Allergic rhinitis Brother   . Asthma Brother   . Allergic rhinitis Brother   . COPD Brother   .  Allergic rhinitis Brother   . Angioedema Neg Hx   . Atopy Neg Hx   . Eczema Neg Hx   . Immunodeficiency Neg Hx   . Urticaria Neg Hx   . Colon cancer Neg Hx     Social History   Socioeconomic History  . Marital status: Married    Spouse name: Not on file  . Number of children: Not on file  . Years of education: Not on file  . Highest education level: Not on file  Occupational History  . Not on file  Social Needs  . Financial resource strain: Not on file  . Food insecurity:    Worry: Not on file    Inability: Not on file  . Transportation needs:    Medical: Not on file    Non-medical: Not on file  Tobacco Use  . Smoking status: Current Every Day Smoker    Packs/day: 1.00    Years: 23.00    Pack  years: 23.00    Types: Cigarettes  . Smokeless tobacco: Never Used  Substance and Sexual Activity  . Alcohol use: Yes    Alcohol/week: 0.0 standard drinks    Comment: once a year  . Drug use: No  . Sexual activity: Yes    Birth control/protection: Other-see comments  Lifestyle  . Physical activity:    Days per week: Not on file    Minutes per session: Not on file  . Stress: Not on file  Relationships  . Social connections:    Talks on phone: Not on file    Gets together: Not on file    Attends religious service: Not on file    Active member of club or organization: Not on file    Attends meetings of clubs or organizations: Not on file    Relationship status: Not on file  . Intimate partner violence:    Fear of current or ex partner: Not on file    Emotionally abused: Not on file    Physically abused: Not on file    Forced sexual activity: Not on file  Other Topics Concern  . Not on file  Social History Narrative  . Not on file    Review of Systems: See HPI, otherwise negative ROS  Physical Exam: BP (!) 112/58   Pulse 72   Temp 97.8 F (36.6 C) (Oral)   Resp 17   SpO2 94%  General:   Alert,  Well-developed, well-nourished, pleasant and cooperative in NAD Neck:  Supple; no masses or thyromegaly. No significant cervical adenopathy. Lungs:  Clear throughout to auscultation.   No wheezes, crackles, or rhonchi. No acute distress. Heart:  Regular rate and rhythm; no murmurs, clicks, rubs,  or gallops. Abdomen: Non-distended, normal bowel sounds.  Soft and nontender without appreciable mass or hepatosplenomegaly.  Pulses:  Normal pulses noted. Extremities:  Without clubbing or edema.  Impression/Plan: Pleasant lady with intermittent esophageal dysphagia.  Longstanding GERD.  EGD now being done to further evaluate symptoms.  The risks, benefits, limitations, alternatives and imponderables have been reviewed with the patient. Potential for esophageal dilation, biopsy, etc.  have also been reviewed.  Questions have been answered. All parties agreeable.     Notice: This dictation was prepared with Dragon dictation along with smaller phrase technology. Any transcriptional errors that result from this process are unintentional and may not be corrected upon review.

## 2018-04-24 NOTE — Discharge Instructions (Signed)
EGD Discharge instructions Please read the instructions outlined below and refer to this sheet in the next few weeks. These discharge instructions provide you with general information on caring for yourself after you leave the hospital. Your doctor may also give you specific instructions. While your treatment has been planned according to the most current medical practices available, unavoidable complications occasionally occur. If you have any problems or questions after discharge, please call your doctor. ACTIVITY  You may resume your regular activity but move at a slower pace for the next 24 hours.   Take frequent rest periods for the next 24 hours.   Walking will help expel (get rid of) the air and reduce the bloated feeling in your abdomen.   No driving for 24 hours (because of the anesthesia (medicine) used during the test).   You may shower.   Do not sign any important legal documents or operate any machinery for 24 hours (because of the anesthesia used during the test).  NUTRITION  Drink plenty of fluids.   You may resume your normal diet.   Begin with a light meal and progress to your normal diet.   Avoid alcoholic beverages for 24 hours or as instructed by your caregiver.  MEDICATIONS  You may resume your normal medications unless your caregiver tells you otherwise.  WHAT YOU CAN EXPECT TODAY  You may experience abdominal discomfort such as a feeling of fullness or gas pains.  FOLLOW-UP  Your doctor will discuss the results of your test with you.  SEEK IMMEDIATE MEDICAL ATTENTION IF ANY OF THE FOLLOWING OCCUR:  Excessive nausea (feeling sick to your stomach) and/or vomiting.   Severe abdominal pain and distention (swelling).   Trouble swallowing.   Temperature over 101 F (37.8 C).   Rectal bleeding or vomiting of blood.    GERD information provided  Continue omeprazole daily  Further recommendations to follow pending review of pathology  report  Office visit with [place] in 3 months     Gastroesophageal Reflux Disease, Adult Gastroesophageal reflux (GER) happens when acid from the stomach flows up into the tube that connects the mouth and the stomach (esophagus). Normally, food travels down the esophagus and stays in the stomach to be digested. With GER, food and stomach acid sometimes move back up into the esophagus. You may have a disease called gastroesophageal reflux disease (GERD) if the reflux:  Happens often.  Causes frequent or very bad symptoms.  Causes problems such as damage to the esophagus. When this happens, the esophagus becomes sore and swollen (inflamed). Over time, GERD can make small holes (ulcers) in the lining of the esophagus. What are the causes? This condition is caused by a problem with the muscle between the esophagus and the stomach. When this muscle is weak or not normal, it does not close properly to keep food and acid from coming back up from the stomach. The muscle can be weak because of:  Tobacco use.  Pregnancy.  Having a certain type of hernia (hiatal hernia).  Alcohol use.  Certain foods and drinks, such as coffee, chocolate, onions, and peppermint. What increases the risk? You are more likely to develop this condition if you:  Are overweight.  Have a disease that affects your connective tissue.  Use NSAID medicines. What are the signs or symptoms? Symptoms of this condition include:  Heartburn.  Difficult or painful swallowing.  The feeling of having a lump in the throat.  A bitter taste in the mouth.  Bad  breath.  Having a lot of saliva.  Having an upset or bloated stomach.  Belching.  Chest pain. Different conditions can cause chest pain. Make sure you see your doctor if you have chest pain.  Shortness of breath or noisy breathing (wheezing).  Ongoing (chronic) cough or a cough at night.  Wearing away of the surface of teeth (tooth enamel).  Weight  loss. How is this treated? Treatment will depend on how bad your symptoms are. Your doctor may suggest:  Changes to your diet.  Medicine.  Surgery. Follow these instructions at home: Eating and drinking   Follow a diet as told by your doctor. You may need to avoid foods and drinks such as: ? Coffee and tea (with or without caffeine). ? Drinks that contain alcohol. ? Energy drinks and sports drinks. ? Bubbly (carbonated) drinks or sodas. ? Chocolate and cocoa. ? Peppermint and mint flavorings. ? Garlic and onions. ? Horseradish. ? Spicy and acidic foods. These include peppers, chili powder, curry powder, vinegar, hot sauces, and BBQ sauce. ? Citrus fruit juices and citrus fruits, such as oranges, lemons, and limes. ? Tomato-based foods. These include red sauce, chili, salsa, and pizza with red sauce. ? Fried and fatty foods. These include donuts, french fries, potato chips, and high-fat dressings. ? High-fat meats. These include hot dogs, rib eye steak, sausage, ham, and bacon. ? High-fat dairy items, such as whole milk, butter, and cream cheese.  Eat small meals often. Avoid eating large meals.  Avoid drinking large amounts of liquid with your meals.  Avoid eating meals during the 2-3 hours before bedtime.  Avoid lying down right after you eat.  Do not exercise right after you eat. Lifestyle   Do not use any products that contain nicotine or tobacco. These include cigarettes, e-cigarettes, and chewing tobacco. If you need help quitting, ask your doctor.  Try to lower your stress. If you need help doing this, ask your doctor.  If you are overweight, lose an amount of weight that is healthy for you. Ask your doctor about a safe weight loss goal. General instructions  Pay attention to any changes in your symptoms.  Take over-the-counter and prescription medicines only as told by your doctor. Do not take aspirin, ibuprofen, or other NSAIDs unless your doctor says it is  okay.  Wear loose clothes. Do not wear anything tight around your waist.  Raise (elevate) the head of your bed about 6 inches (15 cm).  Avoid bending over if this makes your symptoms worse.  Keep all follow-up visits as told by your doctor. This is important. Contact a doctor if:  You have new symptoms.  You lose weight and you do not know why.  You have trouble swallowing or it hurts to swallow.  You have wheezing or a cough that keeps happening.  Your symptoms do not get better with treatment.  You have a hoarse voice. Get help right away if:  You have pain in your arms, neck, jaw, teeth, or back.  You feel sweaty, dizzy, or light-headed.  You have chest pain or shortness of breath.  You throw up (vomit) and your throw-up looks like blood or coffee grounds.  You pass out (faint).  Your poop (stool) is bloody or black.  You cannot swallow, drink, or eat. Summary  If a person has gastroesophageal reflux disease (GERD), food and stomach acid move back up into the esophagus and cause symptoms or problems such as damage to the esophagus.  Treatment  will depend on how bad your symptoms are.  Follow a diet as told by your doctor.  Take all medicines only as told by your doctor. This information is not intended to replace advice given to you by your health care provider. Make sure you discuss any questions you have with your health care provider. Document Released: 08/04/2007 Document Revised: 08/24/2017 Document Reviewed: 08/24/2017 Elsevier Interactive Patient Education  2019 [employer], Care After These instructions provide you with information about caring for yourself after your procedure. Your health care provider may also give you more specific instructions. Your treatment has been planned according to current medical practices, but problems sometimes occur. Call your health care provider if you have any problems or questions  after your procedure. What can I expect after the procedure? After your procedure, you may:  Feel sleepy for several hours.  Feel clumsy and have poor balance for several hours.  Feel forgetful about what happened after the procedure.  Have poor judgment for several hours.  Feel nauseous or vomit.  Have a sore throat if you had a breathing tube during the procedure. Follow these instructions at home: For at least 24 hours after the procedure:      Have a responsible adult stay with you. It is important to have someone help care for you until you are awake and alert.  Rest as needed.  Do not: ? Participate in activities in which you could fall or become injured. ? Drive. ? Use heavy machinery. ? Drink alcohol. ? Take sleeping pills or medicines that cause drowsiness. ? Make important decisions or sign legal documents. ? Take care of children on your own. Eating and drinking  Follow the diet that is recommended by your health care provider.  If you vomit, drink water, juice, or soup when you can drink without vomiting.  Make sure you have little or no nausea before eating solid foods. General instructions  Take over-the-counter and prescription medicines only as told by your health care provider.  If you have sleep apnea, surgery and certain medicines can increase your risk for breathing problems. Follow instructions from your health care provider about wearing your sleep device: ? Anytime you are sleeping, including during daytime naps. ? While taking prescription pain medicines, sleeping medicines, or medicines that make you drowsy.  If you smoke, do not smoke without supervision.  Keep all follow-up visits as told by your health care provider. This is important. Contact a health care provider if:  You keep feeling nauseous or you keep vomiting.  You feel light-headed.  You develop a rash.  You have a fever. Get help right away if:  You have trouble  breathing. Summary  For several hours after your procedure, you may feel sleepy and have poor judgment.  Have a responsible adult stay with you for at least 24 hours or until you are awake and alert. This information is not intended to replace advice given to you by your health care provider. Make sure you discuss any questions you have with your health care provider. Document Released: 06/08/2015 Document Revised: 10/01/2016 Document Reviewed: 06/08/2015 Elsevier Interactive Patient Education  2019 [employer].

## 2018-04-24 NOTE — Progress Notes (Signed)
Pt has been sleeping.husband at bedside. Awoke pt due to time for procedure and to reassess pain and pt states pain is till 10. NAD

## 2018-04-24 NOTE — Anesthesia Preprocedure Evaluation (Signed)
Anesthesia Evaluation  Patient identified by MRN, date of birth, ID band Patient awake    Reviewed: Allergy & Precautions, H&P , NPO status , Patient's Chart, lab work & pertinent test results  Airway Mallampati: II  TM Distance: >3 FB Neck ROM: full    Dental no notable dental hx.    Pulmonary shortness of breath, Current Smoker,    Pulmonary exam normal breath sounds clear to auscultation       Cardiovascular Exercise Tolerance: Good negative cardio ROS   Rhythm:regular Rate:Normal     Neuro/Psych  Headaches, Seizures -,  Anxiety Glioblastoma determined by biopsy of brain negative psych ROS   GI/Hepatic Neg liver ROS, GERD  ,  Endo/Other  negative endocrine ROS  Renal/GU negative Renal ROS  negative genitourinary   Musculoskeletal   Abdominal   Peds  Hematology negative hematology ROS (+)   Anesthesia Other Findings   Reproductive/Obstetrics negative OB ROS                             Anesthesia Physical Anesthesia Plan  ASA: III  Anesthesia Plan: MAC   Post-op Pain Management:    Induction:   PONV Risk Score and Plan:   Airway Management Planned:   Additional Equipment:   Intra-op Plan:   Post-operative Plan:   Informed Consent: I have reviewed the patients History and Physical, chart, labs and discussed the procedure including the risks, benefits and alternatives for the proposed anesthesia with the patient or authorized representative who has indicated his/her understanding and acceptance.     Dental Advisory Given  Plan Discussed with: CRNA  Anesthesia Plan Comments:         Anesthesia Quick Evaluation

## 2018-04-24 NOTE — Op Note (Signed)
[facility] Patient Name: Jill Harrell Procedure Date: 04/24/2018 3:52 PM MRN: [id number] Date of Birth: Jul 16, 1960 Attending MD: Norvel Richards , MD CSN: [id number] Age: 58 Admit Type: Outpatient Procedure:                Upper GI endoscopy Indications:              Dysphagia Providers:                Norvel Richards, MD, Janeece Riggers, RN, Bonnetta Barry, Technician Referring MD:              Medicines:                Propofol per Anesthesia Complications:            No immediate complications. Estimated Blood Loss:     Estimated blood loss was minimal. Procedure:                Pre-Anesthesia Assessment:                           - Prior to the procedure, a History and Physical                            was performed, and patient medications and                            allergies were reviewed. The patient's tolerance of                            previous anesthesia was also reviewed. The risks                            and benefits of the procedure and the sedation                            options and risks were discussed with the patient.                            All questions were answered, and informed consent                            was obtained. Prior Anticoagulants: The patient has                            taken no previous anticoagulant or antiplatelet                            agents. ASA Grade Assessment: III - A patient with                            severe systemic disease. After reviewing the risks  and benefits, the patient was deemed in                            satisfactory condition to undergo the procedure.                           After obtaining informed consent, the endoscope was                            passed under direct vision. Throughout the                            procedure, the patient's blood pressure, pulse, and                            oxygen saturations were  monitored continuously. The                            [id number] ([id number]) was introduced through the and                            advanced to the second part of duodenum. The upper                            GI endoscopy was accomplished without difficulty.                            The patient tolerated the procedure well. Scope In: 3:59:08 PM Scope Out: 4:06:07 PM Total Procedure Duration: 0 hours 6 minutes 59 seconds  Findings:      The examined esophagus was normal.      Diffuse moderate mucosal changes were found in the entire examined       stomach consistent with portal hypertensive gastropathy. This was       biopsied with a cold forceps for histology. Estimated blood loss was       minimal.      The duodenal bulb and second portion of the duodenum were normal.       Finally, the scope was withdrawn. Dilation was performed with a Maloney       dilator with mild resistance at 1 Fr. The dilation site was examined       following endoscope reinsertion and showed no change. Estimated blood       loss: none. Impression:               - Normal esophagus. Status post Maloney dilation.                           - Mucosal changes in the stomach uncertain                            significance as described?"status post biopsy.                           - Normal duodenal bulb and second portion of the  duodenum.                           - No specimens collected. Moderate Sedation:      Moderate (conscious) sedation was personally administered by an       anesthesia professional. The following parameters were monitored: oxygen       saturation, heart rate, blood pressure, respiratory rate, EKG, adequacy       of pulmonary ventilation, and response to care. Recommendation:           - Patient has a contact number available for                            emergencies. The signs and symptoms of potential                            delayed complications were  discussed with the                            patient. Return to normal activities tomorrow.                            Written discharge instructions were provided to the                            patient.                           - Resume previous diet.                           - Continue present medications.                           - No repeat upper endoscopy. Further                            recommendations to follow pending review of                            pathology report                           - Return to GI office in 3 months. Procedure Code(s):        --- Professional ---                           [phone], Esophagogastroduodenoscopy, flexible,                            transoral; with biopsy, single or multiple                           43450, Dilation of esophagus, by unguided sound or                            bougie, single or multiple passes Diagnosis Code(s):        ---  Professional ---                           K31.89, Other diseases of stomach and duodenum                           R13.10, Dysphagia, unspecified CPT copyright 2018 American Medical Association. All rights reserved. The codes documented in this report are preliminary and upon coder review may  be revised to meet current compliance requirements. Cristopher Estimable. Dorismar Chay, MD Norvel Richards, MD 04/24/2018 4:16:26 PM This report has been signed electronically. Number of Addenda: 0

## 2018-04-24 NOTE — Anesthesia Procedure Notes (Signed)
Procedure Name: MAC Date/Time: 04/24/2018 3:52 PM Performed by: Vista Deck, CRNA Pre-anesthesia Checklist: Patient identified, Emergency Drugs available, Suction available, Timeout performed and Patient being monitored Patient Re-evaluated:Patient Re-evaluated prior to induction Oxygen Delivery Method: Nasal Cannula

## 2018-04-24 NOTE — Transfer of Care (Signed)
Immediate Anesthesia Transfer of Care Note  Patient: Jill Harrell  Procedure(s) Performed: ESOPHAGOGASTRODUODENOSCOPY (EGD) WITH PROPOFOL (N/A ) MALONEY DILATION (N/A ) BIOPSY  Patient Location: PACU  Anesthesia Type:MAC  Level of Consciousness: awake and patient cooperative  Airway & Oxygen Therapy: Patient Spontanous Breathing  Post-op Assessment: Report given to RN and Post -op Vital signs reviewed and stable  Post vital signs: Reviewed and stable  Last Vitals:  Vitals Value Taken Time  BP    Temp    Pulse    Resp    SpO2      Last Pain:  Vitals:   04/24/18 1555  TempSrc: Oral  PainSc:          Complications: No apparent anesthesia complications

## 2018-04-24 NOTE — Anesthesia Postprocedure Evaluation (Signed)
Anesthesia Post Note  Patient: Jill Harrell  Procedure(s) Performed: ESOPHAGOGASTRODUODENOSCOPY (EGD) WITH PROPOFOL (N/A ) MALONEY DILATION (N/A ) BIOPSY  Patient location during evaluation: PACU Anesthesia Type: MAC Level of consciousness: awake and alert and patient cooperative Pain management: satisfactory to patient Vital Signs Assessment: post-procedure vital signs reviewed and stable Respiratory status: spontaneous breathing Cardiovascular status: stable Postop Assessment: no apparent nausea or vomiting Anesthetic complications: no     Last Vitals:  Vitals:   04/24/18 1350 04/24/18 1615  BP: (!) 112/58   Pulse: 72   Resp: 17   Temp: 36.6 C (P) 36.8 C  SpO2: 94%     Last Pain:  Vitals:   04/24/18 1615  TempSrc:   PainSc: (P) 0-No pain                 Denitra Donaghey

## 2018-04-26 ENCOUNTER — Encounter: Payer: Self-pay | Admitting: Internal Medicine

## 2018-04-27 ENCOUNTER — Encounter (HOSPITAL_COMMUNITY): Payer: Self-pay | Admitting: Internal Medicine

## 2018-04-29 ENCOUNTER — Encounter: Payer: Self-pay | Admitting: Internal Medicine

## 2018-05-01 ENCOUNTER — Other Ambulatory Visit: Payer: Self-pay | Admitting: Obstetrics & Gynecology

## 2018-05-09 ENCOUNTER — Ambulatory Visit: Payer: Medicare HMO | Admitting: Gastroenterology

## 2018-05-11 ENCOUNTER — Telehealth: Payer: Self-pay | Admitting: Internal Medicine

## 2018-05-11 NOTE — Telephone Encounter (Signed)
Pt called asking if we could call something in for her stomach pain. She has OV coming up on 4/20. I offered to look for any cancellations, but she would rather get a prescription sent to [facility] in [place], Please advise [phone]

## 2018-05-11 NOTE — Telephone Encounter (Signed)
Spoke with pt. She is having severe pain and nausea. Pt said her pain started this morning after she ate 4 bites of scrabbled eggs. Pt's husband ate eggs and has no abdominal pain. Pt had a normal bowel movement without diarrhea this morning. She has taken Zofran for nausea and takes Amitiza nightly. With pt's pain level being 9 1/2 with 10 being the highest number, pt was asked to go to the EG for evaluation. Please advise  Spouse cell # [phone]

## 2018-05-11 NOTE — Telephone Encounter (Signed)
Agree with ED evaluation if having severe pain.

## 2018-05-15 DIAGNOSIS — K222 Esophageal obstruction: Secondary | ICD-10-CM | POA: Diagnosis not present

## 2018-05-15 DIAGNOSIS — R51 Headache: Secondary | ICD-10-CM | POA: Diagnosis not present

## 2018-05-15 DIAGNOSIS — G40909 Epilepsy, unspecified, not intractable, without status epilepticus: Secondary | ICD-10-CM | POA: Diagnosis not present

## 2018-05-15 DIAGNOSIS — F419 Anxiety disorder, unspecified: Secondary | ICD-10-CM | POA: Diagnosis not present

## 2018-05-15 DIAGNOSIS — R42 Dizziness and giddiness: Secondary | ICD-10-CM | POA: Diagnosis not present

## 2018-05-15 DIAGNOSIS — F17210 Nicotine dependence, cigarettes, uncomplicated: Secondary | ICD-10-CM | POA: Diagnosis not present

## 2018-05-15 DIAGNOSIS — G9389 Other specified disorders of brain: Secondary | ICD-10-CM | POA: Diagnosis not present

## 2018-05-15 DIAGNOSIS — R5383 Other fatigue: Secondary | ICD-10-CM | POA: Diagnosis not present

## 2018-05-15 DIAGNOSIS — C711 Malignant neoplasm of frontal lobe: Secondary | ICD-10-CM | POA: Diagnosis not present

## 2018-05-22 ENCOUNTER — Other Ambulatory Visit: Payer: Self-pay | Admitting: Obstetrics & Gynecology

## 2018-05-22 DIAGNOSIS — G894 Chronic pain syndrome: Secondary | ICD-10-CM | POA: Diagnosis not present

## 2018-05-22 DIAGNOSIS — G4700 Insomnia, unspecified: Secondary | ICD-10-CM | POA: Diagnosis not present

## 2018-05-22 DIAGNOSIS — C711 Malignant neoplasm of frontal lobe: Secondary | ICD-10-CM | POA: Diagnosis not present

## 2018-05-22 DIAGNOSIS — F411 Generalized anxiety disorder: Secondary | ICD-10-CM | POA: Diagnosis not present

## 2018-06-13 ENCOUNTER — Telehealth: Payer: Self-pay | Admitting: Obstetrics & Gynecology

## 2018-06-13 ENCOUNTER — Other Ambulatory Visit: Payer: Self-pay | Admitting: Obstetrics & Gynecology

## 2018-06-13 NOTE — Telephone Encounter (Signed)
Patient called, moved her P&P to June 1st.  She is requesting an order for an [place] mammogram to be sent to [facility].  [phone]

## 2018-06-14 NOTE — Telephone Encounter (Signed)
just need screening mammogram, but they arre not doing right now due to COVID 19, number given for xray

## 2018-06-19 DIAGNOSIS — G4700 Insomnia, unspecified: Secondary | ICD-10-CM | POA: Diagnosis not present

## 2018-06-19 DIAGNOSIS — F411 Generalized anxiety disorder: Secondary | ICD-10-CM | POA: Diagnosis not present

## 2018-06-19 DIAGNOSIS — C711 Malignant neoplasm of frontal lobe: Secondary | ICD-10-CM | POA: Diagnosis not present

## 2018-06-19 DIAGNOSIS — G894 Chronic pain syndrome: Secondary | ICD-10-CM | POA: Diagnosis not present

## 2018-06-29 ENCOUNTER — Ambulatory Visit (INDEPENDENT_AMBULATORY_CARE_PROVIDER_SITE_OTHER): Payer: Medicare HMO | Admitting: Gastroenterology

## 2018-06-29 ENCOUNTER — Other Ambulatory Visit: Payer: Self-pay

## 2018-06-29 ENCOUNTER — Encounter: Payer: Self-pay | Admitting: Gastroenterology

## 2018-06-29 DIAGNOSIS — K5900 Constipation, unspecified: Secondary | ICD-10-CM | POA: Diagnosis not present

## 2018-06-29 DIAGNOSIS — R1310 Dysphagia, unspecified: Secondary | ICD-10-CM

## 2018-06-29 DIAGNOSIS — K219 Gastro-esophageal reflux disease without esophagitis: Secondary | ICD-10-CM | POA: Insufficient documentation

## 2018-06-29 DIAGNOSIS — R1319 Other dysphagia: Secondary | ICD-10-CM

## 2018-06-29 MED ORDER — LUBIPROSTONE 24 MCG PO CAPS: ORAL_CAPSULE | ORAL | 3 refills | Status: DC

## 2018-06-29 MED ORDER — PANTOPRAZOLE SODIUM 40 MG PO TBEC: 40.0000 mg | DELAYED_RELEASE_TABLET | Freq: Two times a day (BID) | ORAL | 3 refills | Status: DC

## 2018-06-29 MED ORDER — ONDANSETRON HCL 4 MG PO TABS: 4.0000 mg | ORAL_TABLET | Freq: Three times a day (TID) | ORAL | 2 refills | Status: DC | PRN

## 2018-06-29 NOTE — Progress Notes (Signed)
Primary Care Physician:  Celene Squibb, MD Primary GI:  Garfield Cornea, MD   Patient Location: Home  Provider Location: [facility] office  Reason for Phone Visit: Dysphagia, bloating  Persons present on the phone encounter, with roles: Patient, myself (provider), Zara Council, LPN (updated meds and allergies)  Total time (minutes) spent on medical discussion: 22 minutes  Due to COVID-19, visit was conducted using telephonic method (no video was available).  Visit was requested by patient.  Virtual Visit via Telephone only  I connected with  Ms. Jill Harrell on 06/29/18 at  2:00 PM EDT by telephone and verified that I am speaking with the correct person using two identifiers.   I discussed the limitations, risks, security and privacy concerns of performing an evaluation and management service by telephone and the availability of in person appointments. I also discussed with the patient that there may be a patient responsible charge related to this service. The patient expressed understanding and agreed to proceed.  Chief Complaint  Patient presents with  . Dysphagia    food/pills; sometimes comes back up  . Bloated    HPI:   Patient is a pleasant 58 year old female who presents for telephone visit regarding dysphagia, bloating. She was last seen in December.  History of constipation.  She is on chronic pain medications since surgery for glioblastoma initially diagnosed in 2009.  She had a recurrence and additional surgery about 18 months later. She reports negative Cologuard in 2017.  Previously declined colonoscopies.  When I last saw her she is complaining of solid and pill dysphagia worse in the evenings.  She had an upper endoscopy in February with normal-appearing esophagus status post dilation.  Gastric mucosa appeared consistent with portal hypertension.  Gastric biopsies with chronic inactive gastritis but no H. Pylori. Last imaging of liver was by CT without contrast in October  2017.  She had 2 stable subcentimeter hypodense liver lesions, too small to characterize and considered benign.  No evidence of cirrhosis.  Lately, acid reflux has gotten really bad. Has tried husband's pantoprazole and it seems to help her stomach pain. Usually helps within 20 minutes.  Work started on omeprazole.  Having a lot of typical reflux.  Having vomiting after meals usually within an hour. Wants pill form of odansetron 4mg   Instead of the sublingual.  At night, takes 3 Depakote's.  Large tablets, feels like they get stuck in the middle of the chest.  She has been taking her Ambien, Xanax, Cymbalta following her Depakote and she can feel these pills dissolve and start burning in her esophagus.  Usually would not vomiting.  She started taking cymbalta, ambien, xanax first and they go down fine.  Then she takes her seizure medicines and has to really work to get them all the way down by drinking plenty of fluids.   Esophageal dilatation helped for two weeks. Always has problems with her large depakote pills. Solids foods have to be chewed really fine due to poor teeth. Eating softer foods instead.   BM regular on Amitiza 77mcg BID. No melena, brbpr.    Current Outpatient Medications  Medication Sig Dispense Refill  . budesonide-formoterol (SYMBICORT) 160-4.5 MCG/ACT inhaler Inhale 2 puffs into the lungs 2 (two) times daily. 1 Inhaler 11  . divalproex (DEPAKOTE) 500 MG DR tablet Take 3 tablets (1,500 mg total) by mouth at bedtime. (Patient taking differently: Take 1,000 mg by mouth at bedtime. ) 90 tablet 3  . DULoxetine (CYMBALTA) 60  MG capsule Take 1 capsule (60 mg total) by mouth daily. 90 capsule 3  . DULoxetine (CYMBALTA) 60 MG capsule TAKE 1 CAPSULE BY MOUTH ONCE DAILY 90 capsule 3  . Estradiol (DIVIGEL) 1 MG/GM GEL Place 1 packet onto the skin daily. 30 g 11  . HYDROcodone-acetaminophen (NORCO) 10-325 MG tablet Take 1 tablet by mouth every 6 (six) hours as needed (pain). 120 tablet 0  .  lubiprostone (AMITIZA) 24 MCG capsule TAKE ONE CAPSULE BY MOUTH TWICE DAILY WITH  A  MEAL 60 capsule 11  . methadone (DOLOPHINE) 10 MG tablet Take 1 tablet (10 mg total) by mouth every 8 (eight) hours. 90 tablet 0  . MYRBETRIQ 50 MG TB24 tablet TAKE 1 TABLET BY MOUTH ONCE DAILY 30 tablet 11  . omeprazole (PRILOSEC) 20 MG capsule Take 1 capsule (20 mg total) by mouth daily. 90 capsule 3  . ondansetron (ZOFRAN) 8 MG tablet Take 1 tablet (8 mg total) by mouth every 8 (eight) hours as needed for nausea or vomiting. 90 tablet 3  . prochlorperazine (COMPAZINE) 10 MG tablet Take 1 tablet by mouth every 6 (six) hours as needed.   2  . zolpidem (AMBIEN) 10 MG tablet TK 1 T PO QD HS  0   No current facility-administered medications for this visit.      ROS:  General: Negative for anorexia, weight loss, fever, chills, fatigue, weakness. Eyes: Negative for vision changes.  ENT: Negative for hoarseness, difficulty swallowing , nasal congestion. CV: Negative for chest pain, angina, palpitations, dyspnea on exertion, peripheral edema.  Respiratory: Negative for dyspnea at rest, dyspnea on exertion, cough, sputum, wheezing.  GI: See history of present illness. GU:  Negative for dysuria, hematuria, urinary incontinence, urinary frequency, nocturnal urination.  MS: Negative for joint pain, low back pain.  Derm: Negative for rash or itching.  Neuro: Negative for weakness, abnormal sensation, seizure, frequent headaches, memory loss, confusion.  Psych: Negative for anxiety, depression, suicidal ideation, hallucinations.  Endo: Negative for unusual weight change.  Heme: Negative for bruising or bleeding. Allergy: Negative for rash or hives.   Observations/Objective:  Pleasant female no acute distress.  Otherwise exam unavailable.  Lab Results  Component Value Date   CREATININE 0.63 03/24/2018   BUN 20 03/24/2018   NA 139 03/24/2018   K 3.8 03/24/2018   CL 104 03/24/2018   CO2 25 03/24/2018     Lab Results  Component Value Date   WBC 6.8 03/24/2018   HGB 12.7 03/24/2018   HCT 39.8 03/24/2018   MCV 93.2 03/24/2018   PLT 238 03/24/2018    Assessment and Plan: Pleasant 58 year old female with history of GERD, dysphagia, constipation presenting for follow-up.  Dysphagia improved short-term after esophageal dilation back in February.  Her esophagus appeared normal at the time.  She is having issues swallowing specifically her Depakote which is a large tablet.  Sounds like she has been getting some pills stuck in her esophagus creating burning and inflammation.  Now she is taking her smaller medications first ensuring that they get down before trying to take the Depakote.  She has found that pantoprazole seems to help her reflux and abdominal pain better so we will switch her, will also increase to twice daily given recent issues with possible colitis esophagitis.  She has had some postprandial vomiting, possibly related to refractory GERD or esophageal motility issues.  Stop omeprazole.  Pantoprazole 40 mg twice daily before meals.  She requested Zofran tablets rather than sublingual, prescription provided.  For constipation she will continue Amitiza 24 mcg twice daily.  She will call in a couple weeks if her symptoms have not settled down.  If she has persistent vomiting and/or significant dysphagia, she may need to have an esophagram as next step.  Findings concerning for portal hypertension on EGD in February.  No known history of cirrhosis.  Liver appeared okay on a noncontrast CT in 2017.  Previously she had a mild elevation of alkaline phosphatase copies of recent labs.  When she comes back to see [place] in 4 months, we will plan on a abdominal ultrasound and blood work to get a better look at her liver.  Follow Up Instructions:    I discussed the assessment and treatment plan with the patient. The patient was provided an opportunity to ask questions and all were answered. The patient  agreed with the plan and demonstrated an understanding of the instructions. AVS mailed to patient's home address.   The patient was advised to call back or seek an in-person evaluation if the symptoms worsen or if the condition fails to improve as anticipated.  I provided 22 minutes of non-face-to-face time during this encounter.   Neil Crouch, PA-C

## 2018-06-29 NOTE — Patient Instructions (Addendum)
1. Stop omeprazole. 2. Start pantoprazole 40 mg, 30 minutes before breakfast and 30 minutes before your evening meal.  Continue for at least 1 month, then try to go back to once daily. 3. Continue Amitiza 24 mcg twice daily with food. 4. Zofran 3 times daily as needed for nausea. 5. Call in a couple weeks if you continue to have vomiting, difficulty swallowing. 6. We will see you back in 4 months.  At that time we will consider doing an ultrasound of your abdomen and blood work to look at your liver.  When you had your upper endoscopy, the lining of your stomach was irritated and this can sometimes be seen with liver issues.  Right now with COVID-19 crisis, it would be best to hold off on any elective testing.

## 2018-07-18 ENCOUNTER — Other Ambulatory Visit (HOSPITAL_COMMUNITY): Payer: Self-pay | Admitting: Internal Medicine

## 2018-07-18 DIAGNOSIS — Z1231 Encounter for screening mammogram for malignant neoplasm of breast: Secondary | ICD-10-CM

## 2018-07-19 DIAGNOSIS — G894 Chronic pain syndrome: Secondary | ICD-10-CM | POA: Diagnosis not present

## 2018-07-19 DIAGNOSIS — R110 Nausea: Secondary | ICD-10-CM | POA: Diagnosis not present

## 2018-07-19 DIAGNOSIS — G4700 Insomnia, unspecified: Secondary | ICD-10-CM | POA: Diagnosis not present

## 2018-07-19 DIAGNOSIS — C711 Malignant neoplasm of frontal lobe: Secondary | ICD-10-CM | POA: Diagnosis not present

## 2018-07-19 DIAGNOSIS — F411 Generalized anxiety disorder: Secondary | ICD-10-CM | POA: Diagnosis not present

## 2018-07-31 ENCOUNTER — Other Ambulatory Visit: Payer: Self-pay | Admitting: Obstetrics & Gynecology

## 2018-08-08 DIAGNOSIS — E785 Hyperlipidemia, unspecified: Secondary | ICD-10-CM | POA: Diagnosis not present

## 2018-08-08 DIAGNOSIS — Z0000 Encounter for general adult medical examination without abnormal findings: Secondary | ICD-10-CM | POA: Diagnosis not present

## 2018-08-10 ENCOUNTER — Telehealth: Payer: Self-pay

## 2018-08-10 NOTE — Telephone Encounter (Signed)
PA request for Amitiza received and submitted through [email or web address]. waiting on an approval or denial.

## 2018-08-11 NOTE — Telephone Encounter (Signed)
PA approved through [email or web address] until 03/01/2019. Lmom, will inform pt of approval when she calls back. Approval letter scanned in chart.

## 2018-08-15 ENCOUNTER — Other Ambulatory Visit: Payer: Self-pay | Admitting: Obstetrics & Gynecology

## 2018-08-15 NOTE — Telephone Encounter (Signed)
Pt returned call hey and is aware that her medication was approved.

## 2018-08-23 DIAGNOSIS — F411 Generalized anxiety disorder: Secondary | ICD-10-CM | POA: Diagnosis not present

## 2018-08-23 DIAGNOSIS — G4700 Insomnia, unspecified: Secondary | ICD-10-CM | POA: Diagnosis not present

## 2018-08-23 DIAGNOSIS — C711 Malignant neoplasm of frontal lobe: Secondary | ICD-10-CM | POA: Diagnosis not present

## 2018-08-23 DIAGNOSIS — G894 Chronic pain syndrome: Secondary | ICD-10-CM | POA: Diagnosis not present

## 2018-08-23 DIAGNOSIS — R110 Nausea: Secondary | ICD-10-CM | POA: Diagnosis not present

## 2018-08-24 ENCOUNTER — Other Ambulatory Visit: Payer: Self-pay | Admitting: Adult Health

## 2018-09-07 DIAGNOSIS — M9902 Segmental and somatic dysfunction of thoracic region: Secondary | ICD-10-CM | POA: Diagnosis not present

## 2018-09-07 DIAGNOSIS — M545 Low back pain: Secondary | ICD-10-CM | POA: Diagnosis not present

## 2018-09-07 DIAGNOSIS — M9901 Segmental and somatic dysfunction of cervical region: Secondary | ICD-10-CM | POA: Diagnosis not present

## 2018-09-07 DIAGNOSIS — M546 Pain in thoracic spine: Secondary | ICD-10-CM | POA: Diagnosis not present

## 2018-09-07 DIAGNOSIS — M542 Cervicalgia: Secondary | ICD-10-CM | POA: Diagnosis not present

## 2018-09-07 DIAGNOSIS — M9903 Segmental and somatic dysfunction of lumbar region: Secondary | ICD-10-CM | POA: Diagnosis not present

## 2018-09-14 DIAGNOSIS — J019 Acute sinusitis, unspecified: Secondary | ICD-10-CM | POA: Diagnosis not present

## 2018-09-20 DIAGNOSIS — C711 Malignant neoplasm of frontal lobe: Secondary | ICD-10-CM | POA: Diagnosis not present

## 2018-09-20 DIAGNOSIS — G894 Chronic pain syndrome: Secondary | ICD-10-CM | POA: Diagnosis not present

## 2018-09-20 DIAGNOSIS — F411 Generalized anxiety disorder: Secondary | ICD-10-CM | POA: Diagnosis not present

## 2018-09-20 DIAGNOSIS — G4700 Insomnia, unspecified: Secondary | ICD-10-CM | POA: Diagnosis not present

## 2018-10-02 ENCOUNTER — Telehealth: Payer: Self-pay | Admitting: Internal Medicine

## 2018-10-02 NOTE — Telephone Encounter (Signed)
Lmom, waiting on a return call.  

## 2018-10-02 NOTE — Telephone Encounter (Signed)
Patient needs another prescription sent to her pharmacy, said they changed insurance and her Jill Harrell will cost a lot more than it has been

## 2018-10-04 ENCOUNTER — Other Ambulatory Visit: Payer: Self-pay | Admitting: Adult Health

## 2018-10-06 NOTE — Telephone Encounter (Signed)
Spoke with pt. The prior Auth that was done for Amitiza was approved for pt. Pt's cost is still $222.00 and too expensive for pt. [place] at [phone]

## 2018-10-06 NOTE — Telephone Encounter (Signed)
Tier reduction was denied due to Medicare rules. Letter needed for  Appeal [phone], fax [phone].

## 2018-10-16 DIAGNOSIS — F909 Attention-deficit hyperactivity disorder, unspecified type: Secondary | ICD-10-CM | POA: Diagnosis not present

## 2018-10-16 DIAGNOSIS — B379 Candidiasis, unspecified: Secondary | ICD-10-CM | POA: Diagnosis not present

## 2018-10-16 DIAGNOSIS — J019 Acute sinusitis, unspecified: Secondary | ICD-10-CM | POA: Diagnosis not present

## 2018-10-18 DIAGNOSIS — F411 Generalized anxiety disorder: Secondary | ICD-10-CM | POA: Diagnosis not present

## 2018-10-18 DIAGNOSIS — G4700 Insomnia, unspecified: Secondary | ICD-10-CM | POA: Diagnosis not present

## 2018-10-18 DIAGNOSIS — C711 Malignant neoplasm of frontal lobe: Secondary | ICD-10-CM | POA: Diagnosis not present

## 2018-10-18 DIAGNOSIS — G894 Chronic pain syndrome: Secondary | ICD-10-CM | POA: Diagnosis not present

## 2018-10-23 DIAGNOSIS — J019 Acute sinusitis, unspecified: Secondary | ICD-10-CM | POA: Diagnosis not present

## 2018-10-26 ENCOUNTER — Other Ambulatory Visit: Payer: Self-pay | Admitting: Medical

## 2018-11-13 ENCOUNTER — Telehealth: Payer: Self-pay | Admitting: Internal Medicine

## 2018-11-13 NOTE — Telephone Encounter (Signed)
Received a call from Jill Harrell's husband to schedule an appt w/Dr. Mickeal Skinner for GBM. They are wanting to transfer care from [place] since it is closer to home. Mrs. Lasyone has been scheduled to see Dr. Mickeal Skinner on 9/29 at 11am. Her husband has been made aware to arrive 20 minutes early. I also made Mr. Ritchey aware of our no Development worker, community. He says his wife is fine to be in the exam room on her own.

## 2018-11-21 DIAGNOSIS — C711 Malignant neoplasm of frontal lobe: Secondary | ICD-10-CM | POA: Diagnosis not present

## 2018-11-21 DIAGNOSIS — G894 Chronic pain syndrome: Secondary | ICD-10-CM | POA: Diagnosis not present

## 2018-11-21 DIAGNOSIS — G4700 Insomnia, unspecified: Secondary | ICD-10-CM | POA: Diagnosis not present

## 2018-11-21 DIAGNOSIS — M069 Rheumatoid arthritis, unspecified: Secondary | ICD-10-CM | POA: Diagnosis not present

## 2018-11-21 DIAGNOSIS — F411 Generalized anxiety disorder: Secondary | ICD-10-CM | POA: Diagnosis not present

## 2018-11-21 NOTE — Telephone Encounter (Signed)
Lmom, waiting on a return call.  

## 2018-11-22 ENCOUNTER — Telehealth: Payer: Self-pay | Admitting: Internal Medicine

## 2018-11-22 NOTE — Telephone Encounter (Signed)
Please call patient back at [phone] She was returning a call from yesterday

## 2018-11-24 DIAGNOSIS — J019 Acute sinusitis, unspecified: Secondary | ICD-10-CM | POA: Diagnosis not present

## 2018-11-24 DIAGNOSIS — R110 Nausea: Secondary | ICD-10-CM | POA: Diagnosis not present

## 2018-11-27 NOTE — Telephone Encounter (Signed)
Spoke with pt. Pt's insurance company wants a formulary form completed for Amitiza. Will contact pt's insurance.

## 2018-11-28 ENCOUNTER — Inpatient Hospital Stay: Payer: Medicare HMO | Admitting: Internal Medicine

## 2018-11-28 ENCOUNTER — Telehealth: Payer: Self-pay | Admitting: Internal Medicine

## 2018-11-28 NOTE — Telephone Encounter (Signed)
Jill Harrell has cld and rescheduled appt w/Dr. Mickeal Skinner to 10/6 at 12pm. I advised the pt to bring her records with her because I didn't think they would arrive in time. She's been made aware to arrive 20 minutes.

## 2018-11-30 NOTE — Telephone Encounter (Signed)
Called pt's insurance. Amitiza  Tier reduction can not be approved. The representative didn't have a list of medications that would be lower in price for pt. The rep transferred me to the pharmacy line and that call was disconnected. Pt doesn't want to try Linzess because she said her husband wasn't able to afford it due to the cost.

## 2018-12-05 ENCOUNTER — Inpatient Hospital Stay: Payer: Medicare HMO | Attending: Internal Medicine | Admitting: Internal Medicine

## 2018-12-05 ENCOUNTER — Other Ambulatory Visit: Payer: Self-pay

## 2018-12-05 VITALS — BP 135/56 | HR 71 | Temp 98.0°F | Resp 18 | Ht 64.0 in | Wt 160.7 lb

## 2018-12-05 DIAGNOSIS — G4440 Drug-induced headache, not elsewhere classified, not intractable: Secondary | ICD-10-CM | POA: Insufficient documentation

## 2018-12-05 DIAGNOSIS — Z807 Family history of other malignant neoplasms of lymphoid, hematopoietic and related tissues: Secondary | ICD-10-CM

## 2018-12-05 DIAGNOSIS — Z85841 Personal history of malignant neoplasm of brain: Secondary | ICD-10-CM

## 2018-12-05 DIAGNOSIS — Z720 Tobacco use: Secondary | ICD-10-CM | POA: Diagnosis not present

## 2018-12-05 DIAGNOSIS — C719 Malignant neoplasm of brain, unspecified: Secondary | ICD-10-CM

## 2018-12-05 NOTE — Progress Notes (Signed)
[facility] [place], [place] [address] [phone]   New Patient Evaluation  Date of Service: 12/05/18 Patient Name: Jill Harrell Patient MRN: [id number] Patient DOB: 04-17-1960 Provider: Ventura Sellers, MD  Identifying Statement:  Jill Harrell is a 58 y.o. female with right frontal glioblastoma who presents for initial consultation and evaluation.    Referring Provider: Celene Squibb, MD [place],  [place] [address]  Oncologic History:  Glioblastoma multiforme of right frontal lobe [facility])  07/08/2006 Surgery  Craniotomy and resection performed by Dr. Dyann Kief. Patient opted for alternative therapies only.  08/07/2007 Clinical Event-Other  New patient evaluation at [facility]. Recently moved from [place] to [place].  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  08/30/2007 - Hospital Admission  Patient admitted on POD 7 from clinic for emergency psychiatric evaluation secondary to pre-existing history, steroids, and tumor location.   09/14/2007 - Radiation  Patient initiates radiation therapy with concurrent low dose daily Temozolomide.  11/2007 - 07/18/2008 Chemotherapy  02/19/08: S/p five-day Temozolomide since October 2009 with a two-week break due to thrombocytopenia  07/18/08: S/p six cycles of five-day Temozolomide and two cycles of metronomic Temozolomide and will now come off treatment due to toxicities.   Biomarkers:  MGMT Unknown.  IDH 1/2 Unknown.  EGFR Unknown  TERT Unknown   History of Present Illness: The patient's records from the referring physician were obtained and reviewed and the patient interviewed to confirm this HPI.  Jill Harrell presents for second opinion/evaluation of her chronic headaches, which she has associated with her history of glioblastoma.  She describes severe daily, "all day long" headache pain, frontal or right frontal, associated with photophobia.  These headaches have occurred consistently without interruption since craniotomy in 2009.  In addition, she describes severe headache syndrome requiring use of daily analgesia going back at least 20 years.  She describes frequent use of methadone tablets, often taking 4-6 '10mg'$  tablets each day.  In addition, she doses hydrocodone '10mg'$  ~4x per day.  This regimen has been in place with subtle variations over many years.  She denies any focal neurologic deficits/complaints and has been cancer free for greater than 10 years.   Medications: Current Outpatient Medications on File Prior to Visit  Medication Sig Dispense Refill  . budesonide-formoterol (SYMBICORT) 160-4.5 MCG/ACT inhaler Inhale 2 puffs into the lungs 2 (two) times daily. 1 Inhaler 11  . divalproex (DEPAKOTE) 500 MG DR tablet Take 3 tablets (1,500 mg total) by mouth at bedtime. (Patient taking differently: Take 1,000 mg by mouth at bedtime. ) 90 tablet 3  . DULoxetine (CYMBALTA) 60 MG capsule TAKE 1 CAPSULE BY MOUTH ONCE DAILY 90 capsule 3  . Estradiol (DIVIGEL) 1 MG/GM GEL Place 1 packet onto the skin daily. 30 g 11  . HYDROcodone-acetaminophen (NORCO) 10-325 MG tablet Take 1 tablet by mouth every 6 (six) hours as needed (pain). 120 tablet 0  . methadone (DOLOPHINE) 10 MG tablet Take 1 tablet (10 mg total) by mouth every 8 (eight) hours. (Patient taking differently: Take 20 mg by mouth every 6 (six) hours. ) 90 tablet 0  . MYRBETRIQ 50 MG TB24 tablet TAKE 1 TABLET BY MOUTH ONCE DAILY 30 tablet  11  . ondansetron (ZOFRAN) 4 MG tablet Take 1 tablet (4 mg total) by mouth every 8 (eight) hours as needed for nausea or vomiting. 30 tablet 2  . pantoprazole (PROTONIX) 40 MG tablet Take 1 tablet (40 mg total)  by mouth 2 (two) times daily before a meal. 60 tablet 3  . zolpidem (AMBIEN) 10 MG tablet TK 1 T PO QD HS  0  . ALPRAZolam (XANAX) 1 MG tablet Take 1 tablet by mouth every 6 (six) hours as needed.    . lubiprostone (AMITIZA) 24 MCG capsule TAKE ONE CAPSULE BY MOUTH TWICE DAILY WITH  A  MEAL (Patient not taking: Reported on 12/05/2018) 180 capsule 3  . prochlorperazine (COMPAZINE) 10 MG tablet Take 1 tablet by mouth every 6 (six) hours as needed.   2   No current facility-administered medications on file prior to visit.     Allergies: No Known Allergies Past Medical History:  Past Medical History:  Diagnosis Date  . Anxiety   . Arthritis    hip and wrist  . Brain cancer ([place])    2009 glioblastoma (stage 4) Dr. Bayard Hugger  . Cancer ([place])   . Degenerative disc disease at L5-S1 level   . GERD (gastroesophageal reflux disease)   . Seizures ([place])    brain cancer   Past Surgical History:  Past Surgical History:  Procedure Laterality Date  . ABDOMINAL HYSTERECTOMY    . APPENDECTOMY    . BIOPSY  04/24/2018   Procedure: BIOPSY;  Surgeon: Daneil Dolin, MD;  Location: [facility];  Service: Endoscopy;;  gastric  . BRAIN SURGERY X2    . CHOLECYSTECTOMY    . ESOPHAGOGASTRODUODENOSCOPY (EGD) WITH PROPOFOL N/A 04/24/2018   Dr. Gala Romney: Normal esophagus.  Status post dilation for history of dysphagia.  Diffuse moderate mucosal changes in the entire stomach consistent with portal hypertensive gastropathy.  Biopsy with chronic inactive gastritis, no H. pylori.  Marland Kitchen MALONEY DILATION N/A 04/24/2018   Procedure: Venia Minks DILATION;  Surgeon: Daneil Dolin, MD;  Location: [facility];  Service: Endoscopy;  Laterality: N/A;   Social History:  Social History   Socioeconomic History  . Marital status: Married    Spouse name: Not on file  . Number of children: Not on file  . Years of education: Not on file  . Highest education level: Not on file  Occupational History  . Not on file  Social  Needs  . Financial resource strain: Not on file  . Food insecurity    Worry: Not on file    Inability: Not on file  . Transportation needs    Medical: Not on file    Non-medical: Not on file  Tobacco Use  . Smoking status: Current Every Day Smoker    Packs/day: 1.00    Years: 23.00    Pack years: 23.00    Types: Cigarettes  . Smokeless tobacco: Never Used  Substance and Sexual Activity  . Alcohol use: Yes    Alcohol/week: 0.0 standard drinks    Comment: once a year  . Drug use: No  . Sexual activity: Yes    Birth control/protection: Other-see comments  Lifestyle  . Physical activity    Days per week: Not on file    Minutes per session: Not on file  . Stress: Not on file  Relationships  . Social Herbalist on phone: Not on file    Gets together: Not on file    Attends  religious service: Not on file    Active member of club or organization: Not on file    Attends meetings of clubs or organizations: Not on file    Relationship status: Not on file  . Intimate partner violence    Fear of current or ex partner: Not on file    Emotionally abused: Not on file    Physically abused: Not on file    Forced sexual activity: Not on file  Other Topics Concern  . Not on file  Social History Narrative  . Not on file   Family History:  Family History  Adopted: Yes  Problem Relation Age of Onset  . Heart disease Father   . Hypotension Father   . Diabetes Father   . Allergic rhinitis Father   . Hyperlipidemia Maternal Grandfather   . Hypertension Maternal Grandfather   . Heart disease Maternal Grandfather   . Allergic rhinitis Maternal Grandfather   . Allergic rhinitis Mother   . Allergic rhinitis Maternal Grandmother   . Lymphoma Maternal Grandmother   . Asthma Brother   . Allergic rhinitis Brother   . Asthma Brother   . Allergic rhinitis Brother   . COPD Brother   . Allergic rhinitis Brother   . Angioedema Neg Hx   . Atopy Neg Hx   . Eczema Neg Hx   .  Immunodeficiency Neg Hx   . Urticaria Neg Hx   . Colon cancer Neg Hx     Review of Systems: Constitutional: Denies fevers, chills or abnormal weight loss Eyes: Denies blurriness of vision Ears, nose, mouth, throat, and face: Denies mucositis or sore throat Respiratory: Denies cough, dyspnea or wheezes Cardiovascular: Denies palpitation, chest discomfort or lower extremity swelling Gastrointestinal:  Denies nausea, constipation, diarrhea GU: Denies dysuria or incontinence Skin: Denies abnormal skin rashes Neurological: Per HPI Musculoskeletal: Denies joint pain, back or neck discomfort. No decrease in ROM Behavioral/Psych: Denies anxiety, disturbance in thought content, and mood instability  Physical Exam: Vitals:   12/05/18 1215  BP: (!) 135/56  Pulse: 71  Resp: 18  Temp: 98 F (36.7 C)  SpO2: 98%   KPS: 90. General: Alert, cooperative, pleasant, in no acute distress Head: Craniotomy scar noted, dry and intact. EENT: No conjunctival injection or scleral icterus. Oral mucosa moist Lungs: Resp effort normal Cardiac: Regular rate and rhythm Abdomen: Soft, non-distended abdomen Skin: Alopecia superior aspect of head Extremities: No clubbing or edema  Neurologic Exam: Mental Status: Awake, alert, attentive to examiner. Oriented to self and environment. Language is fluent with intact comprehension.  Cranial Nerves: Visual acuity is grossly normal. Visual fields are full. Extra-ocular movements intact. No ptosis. Face is symmetric, tongue midline. Motor: Tone and bulk are normal. Power is full in both arms and legs. Reflexes are symmetric, no pathologic reflexes present. Intact finger to nose bilaterally Sensory: Intact to light touch and temperature Gait: Normal and tandem gait is deferred   Labs: I have reviewed the data as listed    Component Value Date/Time   NA 139 03/24/2018 1517   NA 137 02/18/2013 1233   K 3.8 03/24/2018 1517   K 4.0 02/18/2013 1233   CL 104  03/24/2018 1517   CL 104 02/18/2013 1233   CO2 25 03/24/2018 1517   CO2 30 02/18/2013 1233   GLUCOSE 102 (H) 03/24/2018 1517   GLUCOSE 108 (H) 02/18/2013 1233   BUN 20 03/24/2018 1517   BUN 33 (H) 02/18/2013 1233   CREATININE 0.63 03/24/2018 1517  CREATININE 0.71 12/01/2015 1151   CALCIUM 8.8 (L) 03/24/2018 1517   CALCIUM 8.6 02/18/2013 1233   PROT 7.2 12/16/2016 1528   PROT 7.6 10/18/2011 0919   ALBUMIN 3.7 12/16/2016 1528   ALBUMIN 3.7 10/18/2011 0919   AST 52 (H) 12/16/2016 1528   AST 12 (L) 10/18/2011 0919   ALT 29 12/16/2016 1528   ALT 20 10/18/2011 0919   ALKPHOS 57 12/16/2016 1528   ALKPHOS 69 10/18/2011 0919   BILITOT 0.4 12/16/2016 1528   BILITOT 0.2 10/18/2011 0919   GFRNONAA >60 03/24/2018 1517   GFRNONAA >89 12/01/2015 1151   GFRAA >60 03/24/2018 1517   GFRAA >89 12/01/2015 1151   Lab Results  Component Value Date   WBC 6.8 03/24/2018   NEUTROABS 3.5 03/24/2018   HGB 12.7 03/24/2018   HCT 39.8 03/24/2018   MCV 93.2 03/24/2018   PLT 238 03/24/2018     Assessment/Plan 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  She presents today with clear syndrome of chronic daily headache secondary to medication/analgesia overuse.  Her burden of analgesia is very high and this dependence has developed over many years.   Today we explained that her brain tumor (or resection cavity) is NOT the underlying etiology for her headaches.  We explained how very few brain tumor patients actually experience chronic headaches related to their tumors.    It is apparent she had an underlying migraine syndrome going back into her youth, also strong family history/association. Her current headache is likely a continuation and exacerbation of that syndrome, decompensated by chronic overreliance on narcotics.  Because of the difficult road ahead in beginning weaning process, we will refer patient to Dr. Johnny Bridge  of [facility] for specialized pain management.  Goal is very gradual withdrawal of narcotics and analgesia.  We have asked the patient to begin by carefully tracking her methadone and hydrocodone intake (as well as headache severity) with a paper calendar.      In the meantime, she is not due for annual MRI for glioblastoma follow up until next March.  She may follow with myself or Dr. Imagene Riches at [facility].  All questions were answered. The patient knows to call the clinic with any problems, questions or concerns. No barriers to learning were detected.  We will continue to follow along and expect to hear from Ms. Marina after her evaluation with the pain clinic.  The total time spent in the encounter was 60 minutes and more than 50% was on counseling and review of test results   Ventura Sellers, MD Medical Director of Neuro-Oncology [facility] [place] 12/05/18 4:31 PM

## 2018-12-06 ENCOUNTER — Telehealth: Payer: Self-pay | Admitting: Internal Medicine

## 2018-12-06 NOTE — Telephone Encounter (Signed)
No los per 10/6. °

## 2018-12-12 ENCOUNTER — Telehealth: Payer: Self-pay | Admitting: *Deleted

## 2018-12-12 NOTE — Telephone Encounter (Signed)
Faxed referral to [place] [phone]

## 2018-12-14 NOTE — Telephone Encounter (Signed)
I printed off the patient assistance form from [place] and patient is going to fill it out and bring it to our office to fax to [place].

## 2018-12-14 NOTE — Telephone Encounter (Signed)
I spoke with Gershon Mussel at [facility] in [place]) and he stated that the patient is in the doughnut hole(Medicare) and has to meet a deductible and her cost will be $217.45.  We will submit patient assistance through the drug company until she gets out of the doughnut hole.

## 2018-12-19 ENCOUNTER — Other Ambulatory Visit: Payer: Self-pay | Admitting: Gastroenterology

## 2018-12-25 DIAGNOSIS — G4700 Insomnia, unspecified: Secondary | ICD-10-CM | POA: Diagnosis not present

## 2018-12-25 DIAGNOSIS — C711 Malignant neoplasm of frontal lobe: Secondary | ICD-10-CM | POA: Diagnosis not present

## 2018-12-25 DIAGNOSIS — G894 Chronic pain syndrome: Secondary | ICD-10-CM | POA: Diagnosis not present

## 2018-12-25 DIAGNOSIS — F411 Generalized anxiety disorder: Secondary | ICD-10-CM | POA: Diagnosis not present

## 2019-01-01 ENCOUNTER — Telehealth: Payer: Self-pay | Admitting: *Deleted

## 2019-01-01 NOTE — Telephone Encounter (Signed)
Spoke with pt and she is supposed to bring pt assistance forms by for [place] to fax to [place].

## 2019-01-06 DIAGNOSIS — F39 Unspecified mood [affective] disorder: Secondary | ICD-10-CM | POA: Diagnosis not present

## 2019-01-06 DIAGNOSIS — G43009 Migraine without aura, not intractable, without status migrainosus: Secondary | ICD-10-CM | POA: Diagnosis not present

## 2019-01-08 ENCOUNTER — Other Ambulatory Visit: Payer: Self-pay | Admitting: *Deleted

## 2019-01-08 MED ORDER — MIRABEGRON ER 50 MG PO TB24: 50.0000 mg | ORAL_TABLET | Freq: Every day | ORAL | 11 refills | Status: DC

## 2019-01-11 ENCOUNTER — Telehealth: Payer: Self-pay | Admitting: Gastroenterology

## 2019-01-11 NOTE — Telephone Encounter (Signed)
Patient is due OV for GERD, constipation, liver (needs u/s and labs).

## 2019-01-12 ENCOUNTER — Encounter: Payer: Self-pay | Admitting: Internal Medicine

## 2019-01-12 NOTE — Telephone Encounter (Signed)
PATIENT SCHEDULED AND LETTER SENT  °

## 2019-01-15 ENCOUNTER — Other Ambulatory Visit: Payer: Self-pay | Admitting: *Deleted

## 2019-01-15 MED ORDER — MIRABEGRON ER 50 MG PO TB24: 50.0000 mg | ORAL_TABLET | Freq: Every day | ORAL | 3 refills | Status: DC

## 2019-01-15 NOTE — Telephone Encounter (Signed)
Noted  

## 2019-01-22 DIAGNOSIS — G894 Chronic pain syndrome: Secondary | ICD-10-CM | POA: Diagnosis not present

## 2019-01-22 DIAGNOSIS — F411 Generalized anxiety disorder: Secondary | ICD-10-CM | POA: Diagnosis not present

## 2019-01-22 DIAGNOSIS — Z23 Encounter for immunization: Secondary | ICD-10-CM | POA: Diagnosis not present

## 2019-01-22 DIAGNOSIS — G4700 Insomnia, unspecified: Secondary | ICD-10-CM | POA: Diagnosis not present

## 2019-01-22 DIAGNOSIS — C711 Malignant neoplasm of frontal lobe: Secondary | ICD-10-CM | POA: Diagnosis not present

## 2019-01-30 ENCOUNTER — Telehealth (HOSPITAL_COMMUNITY): Payer: Self-pay

## 2019-01-30 NOTE — Telephone Encounter (Signed)
Unable to leave a voicemail message

## 2019-01-31 ENCOUNTER — Telehealth: Payer: Self-pay | Admitting: Internal Medicine

## 2019-01-31 ENCOUNTER — Ambulatory Visit: Payer: Medicare HMO | Admitting: Gastroenterology

## 2019-01-31 ENCOUNTER — Encounter: Payer: Self-pay | Admitting: Internal Medicine

## 2019-01-31 NOTE — Telephone Encounter (Signed)
PATIENT WAS A NO SHOW AND LETTER SENT  °

## 2019-01-31 NOTE — Progress Notes (Deleted)
Primary Care Physician: Celene Squibb, MD  Primary Gastroenterologist:    No chief complaint on file.   HPI: Jill Harrell is a 58 y.o. female here for follow-up of GERD, constipation, liver issues (needing ultrasound and labs).  Patient last seen in April 2020 via telephone visit.  She is on chronic pain medication since surgery for glioblastoma initially diagnosed in 2009.  She had a recurrence and additional surgery about 18 months later.  Chronically constipated.  She reports a negative Cologuard in 2017 and has previously declined colonoscopies.  She had an upper endoscopy in February 2020 for dysphagia.  She had normal-appearing esophagus status post dilation.  Gastric mucosa appeared consistent with portal hypertension.  Gastric biopsies with chronic inactive gastritis but no H. pylori.  Last imaging of liver was CT without contrast in October 2017.  She had 2 stable subcentimeter hypodense liver lesions, too small to characterize and considered benign.  No evidence of cirrhosis.  Fortunately esophageal dilation helped her only for a few weeks.  Describes having large pills stick in the back of the throat creating burning sensation.  We switched her PPI from omeprazole to pantoprazole twice daily.  Plan to have her come back for follow-up and work-up if needed but everything was delayed due to Covid.  Current Outpatient Medications  Medication Sig Dispense Refill  . ALPRAZolam (XANAX) 1 MG tablet Take 1 tablet by mouth every 6 (six) hours as needed.    . budesonide-formoterol (SYMBICORT) 160-4.5 MCG/ACT inhaler Inhale 2 puffs into the lungs 2 (two) times daily. 1 Inhaler 11  . divalproex (DEPAKOTE) 500 MG DR tablet Take 3 tablets (1,500 mg total) by mouth at bedtime. (Patient taking differently: Take 1,000 mg by mouth at bedtime. ) 90 tablet 3  . DULoxetine (CYMBALTA) 60 MG capsule TAKE 1 CAPSULE BY MOUTH ONCE DAILY 90 capsule 3  . Estradiol (DIVIGEL) 1 MG/GM GEL Place 1 packet  onto the skin daily. 30 g 11  . HYDROcodone-acetaminophen (NORCO) 10-325 MG tablet Take 1 tablet by mouth every 6 (six) hours as needed (pain). 120 tablet 0  . lubiprostone (AMITIZA) 24 MCG capsule TAKE ONE CAPSULE BY MOUTH TWICE DAILY WITH  A  MEAL (Patient not taking: Reported on 12/05/2018) 180 capsule 3  . methadone (DOLOPHINE) 10 MG tablet Take 1 tablet (10 mg total) by mouth every 8 (eight) hours. (Patient taking differently: Take 20 mg by mouth every 6 (six) hours. ) 90 tablet 0  . mirabegron ER (MYRBETRIQ) 50 MG TB24 tablet Take 1 tablet (50 mg total) by mouth daily. 90 tablet 3  . ondansetron (ZOFRAN) 4 MG tablet TAKE 1 TABLET(4 MG) BY MOUTH EVERY 8 HOURS AS NEEDED FOR NAUSEA OR VOMITING 30 tablet 2  . pantoprazole (PROTONIX) 40 MG tablet Take 1 tablet (40 mg total) by mouth 2 (two) times daily before a meal. 60 tablet 3  . prochlorperazine (COMPAZINE) 10 MG tablet Take 1 tablet by mouth every 6 (six) hours as needed.   2  . zolpidem (AMBIEN) 10 MG tablet TK 1 T PO QD HS  0   No current facility-administered medications for this visit.     Allergies as of 01/31/2019  . (No Known Allergies)    ROS:  General: Negative for anorexia, weight loss, fever, chills, fatigue, weakness. ENT: Negative for hoarseness, difficulty swallowing , nasal congestion. CV: Negative for chest pain, angina, palpitations, dyspnea on exertion, peripheral edema.  Respiratory: Negative for dyspnea at rest, dyspnea  on exertion, cough, sputum, wheezing.  GI: See history of present illness. GU:  Negative for dysuria, hematuria, urinary incontinence, urinary frequency, nocturnal urination.  Endo: Negative for unusual weight change.    Physical Examination:   There were no vitals taken for this visit.  General: Well-nourished, well-developed in no acute distress.  Eyes: No icterus. Mouth: Oropharyngeal mucosa moist and pink , no lesions erythema or exudate. Lungs: Clear to auscultation bilaterally.  Heart:  Regular rate and rhythm, no murmurs rubs or gallops.  Abdomen: Bowel sounds are normal, nontender, nondistended, no hepatosplenomegaly or masses, no abdominal bruits or hernia , no rebound or guarding.   Extremities: No lower extremity edema. No clubbing or deformities. Neuro: Alert and oriented x 4   Skin: Warm and dry, no jaundice.   Psych: Alert and cooperative, normal mood and affect.  Labs:  ***  Imaging Studies: No results found.

## 2019-02-01 ENCOUNTER — Telehealth: Payer: Self-pay | Admitting: *Deleted

## 2019-02-01 NOTE — Telephone Encounter (Signed)
Patient called wanting refill on divigel. Patient not able to come in for appointment due to terminal brain cancer at this time. States missed appointments previously due to [place]. Patient's husband drives her to appointments due to her diagnosis. Hopefully she will be able to schedule an appointment soon.

## 2019-02-02 ENCOUNTER — Other Ambulatory Visit: Payer: Self-pay | Admitting: Obstetrics & Gynecology

## 2019-02-02 MED ORDER — DIVIGEL 1 MG/GM TD GEL: 1.0000 | Freq: Every day | TRANSDERMAL | 11 refills | Status: DC

## 2019-02-02 NOTE — Telephone Encounter (Signed)
refilled 

## 2019-02-02 NOTE — Telephone Encounter (Signed)
Telephoned patient at home number and advised patient prescription was refilled.

## 2019-02-10 ENCOUNTER — Encounter (HOSPITAL_COMMUNITY): Payer: Self-pay | Admitting: Emergency Medicine

## 2019-02-10 ENCOUNTER — Emergency Department (HOSPITAL_COMMUNITY)
Admission: EM | Admit: 2019-02-10 | Discharge: 2019-02-10 | Disposition: A | Discharge status: Home / Self Care | Payer: Medicare HMO | Attending: Emergency Medicine | Admitting: Emergency Medicine

## 2019-02-10 ENCOUNTER — Emergency Department (HOSPITAL_COMMUNITY): Payer: Medicare HMO

## 2019-02-10 ENCOUNTER — Other Ambulatory Visit: Payer: Self-pay

## 2019-02-10 DIAGNOSIS — Z85841 Personal history of malignant neoplasm of brain: Secondary | ICD-10-CM | POA: Diagnosis not present

## 2019-02-10 DIAGNOSIS — M79641 Pain in right hand: Secondary | ICD-10-CM | POA: Diagnosis not present

## 2019-02-10 DIAGNOSIS — Y93E3 Activity, vacuuming: Secondary | ICD-10-CM | POA: Diagnosis not present

## 2019-02-10 DIAGNOSIS — Y92018 Other place in single-family (private) house as the place of occurrence of the external cause: Secondary | ICD-10-CM | POA: Insufficient documentation

## 2019-02-10 DIAGNOSIS — W010XXA Fall on same level from slipping, tripping and stumbling without subsequent striking against object, initial encounter: Secondary | ICD-10-CM | POA: Diagnosis not present

## 2019-02-10 DIAGNOSIS — Y998 Other external cause status: Secondary | ICD-10-CM | POA: Insufficient documentation

## 2019-02-10 DIAGNOSIS — S52121A Displaced fracture of head of right radius, initial encounter for closed fracture: Secondary | ICD-10-CM | POA: Diagnosis not present

## 2019-02-10 DIAGNOSIS — Z79899 Other long term (current) drug therapy: Secondary | ICD-10-CM | POA: Diagnosis not present

## 2019-02-10 DIAGNOSIS — S6991XA Unspecified injury of right wrist, hand and finger(s), initial encounter: Secondary | ICD-10-CM | POA: Diagnosis present

## 2019-02-10 DIAGNOSIS — F17210 Nicotine dependence, cigarettes, uncomplicated: Secondary | ICD-10-CM | POA: Diagnosis not present

## 2019-02-10 MED ORDER — OXYCODONE-ACETAMINOPHEN 5-325 MG PO TABS
1.0000 | ORAL_TABLET | Freq: Once | ORAL | Status: AC
  Administered 2019-02-10: 1 via ORAL
  Filled 2019-02-10: qty 1

## 2019-02-10 MED ORDER — HYDROCODONE-ACETAMINOPHEN 5-325 MG PO TABS: 1.0000 | ORAL_TABLET | Freq: Four times a day (QID) | ORAL | 0 refills | Status: AC | PRN

## 2019-02-10 NOTE — ED Triage Notes (Signed)
Patient c/o right forearm, wrist, and hand pain. Per patient tripped this morning and tried to catch herself with hand, in which all her weight was placed on that arm. Denies hitting head or LOC.

## 2019-02-10 NOTE — Discharge Instructions (Addendum)
As discussed, you broke your radial bone. I have given you the number for Dr. Claudia Desanctis, the hand surgeon. Call him Monday to schedule an appointment. He is in the clinic on Thursday. I am sending you home with a few hydrocodones for severe pain. Try to take ibuprofen or tylenol for moderate pain and save the opiates for severe pain. Do not drive or operate machinery while on the medication. Keep splint on until you see Dr. Claudia Desanctis. Return to the ER for new or worsening symptoms.

## 2019-02-10 NOTE — ED Provider Notes (Signed)
[facility] EMERGENCY DEPARTMENT Provider Note   CSN: [id number] Arrival date & time: 02/10/19  1532     History Chief Complaint  Patient presents with  . Arm Pain    Jill Harrell is a 58 y.o. female with a past medical history significant for anxiety, brain cancer, seizures, and GERD who presents to the ED due to sudden onset of right forearm, wrist, and hand pain after a mechanical fall just prior to arrival.  Patient states she was vacuuming, tripped over the cord, and began to fall backwards when she caught her fall with her right arm.  Patient denies head injury and loss of consciousness.  Patient has tried ice therapy prior to arrival with moderate relief.  Right arm pain is associated with decreased range of motion and decreased sensation.  Patient denies previous injury to right arm and wrist.  Patient denies fever and chills.  Patient denies other injuries.  Past Medical History:  Diagnosis Date  . Anxiety   . Arthritis    hip and wrist  . 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 Bayard Hugger  . Cancer ([place])   . Degenerative disc disease at L5-S1 level   . GERD (gastroesophageal reflux disease)   . Seizures ([place])    brain cancer    Patient Active Problem List   Diagnosis Date Noted  . Medication overuse headache 12/05/2018  . GERD (gastroesophageal reflux disease) 06/29/2018  . Esophageal dysphagia 02/16/2018  . Constipation 01/07/2016  . Abdominal pain, periumbilical [id number]  . Aortic atherosclerosis ([place]) 12/11/2015  . 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 Coughing 07/07/2015  . Dyspnea 07/07/2015  . Glioblastoma determined by biopsy of brain ([place]) 04/21/2015  . Migraines 04/21/2015  . Seizures ([place]) 04/10/2015  . Symptomatic menopausal or female climacteric states 08/20/2013    Past Surgical History:  Procedure Laterality Date  . ABDOMINAL HYSTERECTOMY    . APPENDECTOMY    . BIOPSY   04/24/2018   Procedure: BIOPSY;  Surgeon: Daneil Dolin, MD;  Location: [facility] ENDO SUITE;  Service: Endoscopy;;  gastric  . BRAIN SURGERY X2    . CHOLECYSTECTOMY    . ESOPHAGOGASTRODUODENOSCOPY (EGD) WITH PROPOFOL N/A 04/24/2018   Dr. Gala Romney: Normal esophagus.  Status post dilation for history of dysphagia.  Diffuse moderate mucosal changes in the entire stomach consistent with portal hypertensive gastropathy.  Biopsy with chronic inactive gastritis, no H. pylori.  Marland Kitchen MALONEY DILATION N/A 04/24/2018   Procedure: Venia Minks DILATION;  Surgeon: Daneil Dolin, MD;  Location: [facility] ENDO SUITE;  Service: Endoscopy;  Laterality: N/A;     OB History    Gravida  4   Para  0   Term      Preterm      AB  4   Living  0     SAB  4   TAB      Ectopic      Multiple      Live Births              Family History  Adopted: Yes  Problem Relation Age of Onset  . Heart disease Father   . Hypotension Father   . Diabetes Father   . Allergic rhinitis Father   . Hyperlipidemia Maternal Grandfather   . Hypertension Maternal Grandfather   . Heart disease Maternal Grandfather   . Allergic rhinitis Maternal Grandfather   . Allergic rhinitis Mother   . Allergic rhinitis Maternal  Grandmother   . Lymphoma Maternal Grandmother   . Asthma Brother   . Allergic rhinitis Brother   . Asthma Brother   . Allergic rhinitis Brother   . COPD Brother   . Allergic rhinitis Brother   . Angioedema Neg Hx   . Atopy Neg Hx   . Eczema Neg Hx   . Immunodeficiency Neg Hx   . Urticaria Neg Hx   . Colon cancer Neg Hx     Social History   Tobacco Use  . Smoking status: Current Every Day Smoker    Packs/day: 1.00    Years: 23.00    Pack years: 23.00    Types: Cigarettes  . Smokeless tobacco: Never Used  Substance Use Topics  . Alcohol use: Yes    Alcohol/week: 0.0 standard drinks    Comment: once a year  . Drug use: No    Home Medications Prior to Admission medications   Medication Sig Start Date  End Date Taking? Authorizing Provider  ALPRAZolam [name]) 1 MG tablet Take 1 tablet by mouth every 6 (six) hours as needed. 11/21/18   [provider]  budesonide-formoterol (SYMBICORT) 160-4.5 MCG/ACT inhaler Inhale 2 puffs into the lungs 2 (two) times daily. 10/15/16   [name], MD  divalproex (DEPAKOTE) 500 MG DR tablet Take 3 tablets (1,500 mg total) by mouth at bedtime. Patient taking differently: Take 1,000 mg by mouth at bedtime.  04/21/15   Penland, Kelby Fam, MD  DULoxetine (CYMBALTA) 60 MG capsule TAKE 1 CAPSULE BY MOUTH ONCE DAILY 06/07/17   [name], MD  Estradiol (DIVIGEL) 1 MG/GM GEL Place 1 packet onto the skin daily. 02/02/19   [name], MD  HYDROcodone-acetaminophen (NORCO) 10-325 MG tablet Take 1 tablet by mouth every 6 (six) hours as needed (pain). 05/23/15   Baird Cancer, PA-C  HYDROcodone-acetaminophen (NORCO/VICODIN) 5-325 MG tablet Take 1 tablet by mouth every 6 (six) hours as needed for up to 5 days for severe pain. 02/10/19 02/15/19  Cheek, Comer Locket, PA-C  lubiprostone (AMITIZA) 24 MCG capsule TAKE ONE CAPSULE BY MOUTH TWICE DAILY WITH  A  MEAL Patient not taking: Reported on 12/05/2018 06/29/18   Mahala Menghini, PA-C  methadone (DOLOPHINE) 10 MG tablet Take 1 tablet (10 mg total) by mouth every 8 (eight) hours. Patient taking differently: Take 20 mg by mouth every 6 (six) hours.  06/10/15   Baird Cancer, PA-C  mirabegron ER (MYRBETRIQ) 50 MG TB24 tablet Take 1 tablet (50 mg total) by mouth daily. 01/15/19   [name], MD  ondansetron (ZOFRAN) 4 MG tablet TAKE 1 TABLET(4 MG) BY MOUTH EVERY 8 HOURS AS NEEDED FOR NAUSEA OR VOMITING 12/19/18   Annitta Needs, NP  pantoprazole (PROTONIX) 40 MG tablet Take 1 tablet (40 mg total) by mouth 2 (two) times daily before a meal. 06/29/18   Mahala Menghini, PA-C  prochlorperazine (COMPAZINE) 10 MG tablet Take 1 tablet by mouth every 6 (six) hours as needed.  03/09/16   [provider]    zolpidem (AMBIEN) 10 MG tablet TK 1 T PO QD HS 03/21/17   [provider]    Allergies    Patient has no known allergies.  Review of Systems   Review of Systems  Constitutional: Negative for chills and fever.  Musculoskeletal: Positive for arthralgias and joint swelling.  Skin: Negative for color change.  Neurological: Positive for numbness.    Physical Exam Updated Vital Signs BP (!) 141/62 (  BP Location: Left Arm)   Pulse 71   Temp 97.7 F (36.5 C) (Oral)   Resp 20   Ht 5\' 4"  (1.626 m)   Wt 71.2 kg   SpO2 100%   BMI 26.95 kg/m   Physical Exam Vitals and nursing note reviewed.  Constitutional:      General: She is not in acute distress.    Appearance: She is not toxic-appearing.     Comments: Appears very uncomfortable  HENT:     Head: Normocephalic.  Eyes:     Pupils: Pupils are equal, round, and reactive to light.  Cardiovascular:     Rate and Rhythm: Normal rate and regular rhythm.     Pulses: Normal pulses.     Heart sounds: Normal heart sounds. No murmur. No friction rub. No gallop.   Pulmonary:     Effort: Pulmonary effort is normal.     Breath sounds: Normal breath sounds.  Abdominal:     General: Abdomen is flat. Bowel sounds are normal. There is no distension.     Palpations: Abdomen is soft.  Musculoskeletal:     Cervical back: Neck supple.     Comments: Right Arm: Tenderness to palpation over distal radius and ulna with overlying edema. Tenderness over wrist and proximal portion of dorsum of hand. Very limited ROM of wrists and fingers due to pain. Decreased sensation of right hand/distal forearm. Radial pulse intact. Soft compartments. No snuffbox tenderness. No tenderness to palpation at elbow or shoulder. Full ROM of elbow and shoulder.  Skin:    General: Skin is warm and dry.  Neurological:     General: No focal deficit present.     Mental Status: She is alert.     ED Results / Procedures / Treatments   Labs (all labs ordered are  listed, but only abnormal results are displayed) Labs Reviewed - No data to display  EKG None  Radiology DG Forearm Right  Result Date: 02/10/2019 CLINICAL DATA:  Patient c/o right forearm, wrist, and hand pain. Per patient tripped this morning and tried to catch herself with hand, in which all her weight was placed on that arm EXAM: RIGHT FOREARM - 2 VIEW; RIGHT HAND - COMPLETE 3+ VIEW COMPARISON:  None. FINDINGS: Right forearm: There is a mildly displaced oblique fracture at the head of the radius with possible intra-articular extension. No evidence of dislocation. No additional fracture identified in the radius or ulna. There is regional soft tissue swelling. Right hand: No acute fracture or dislocation in the right hand. Mild scattered degenerative changes. No focal bony lesion. Regional soft tissues are unremarkable. IMPRESSION: 1. Mildly displaced oblique fracture at the right radial head with possible intra-articular extension. 2. No acute fracture or dislocation of the right hand. Electronically Signed   By: Audie Pinto M.D.   On: 02/10/2019 17:00   DG Hand Complete Right  Result Date: 02/10/2019 CLINICAL DATA:  Patient c/o right forearm, wrist, and hand pain. Per patient tripped this morning and tried to catch herself with hand, in which all her weight was placed on that arm EXAM: RIGHT FOREARM - 2 VIEW; RIGHT HAND - COMPLETE 3+ VIEW COMPARISON:  None. FINDINGS: Right forearm: There is a mildly displaced oblique fracture at the head of the radius with possible intra-articular extension. No evidence of dislocation. No additional fracture identified in the radius or ulna. There is regional soft tissue swelling. Right hand: No acute fracture or dislocation in the right hand. Mild scattered degenerative changes.  No focal bony lesion. Regional soft tissues are unremarkable. IMPRESSION: 1. Mildly displaced oblique fracture at the right radial head with possible intra-articular extension. 2.  No acute fracture or dislocation of the right hand. Electronically Signed   By: Audie Pinto M.D.   On: 02/10/2019 17:00    Procedures Procedures (including critical care time)  Medications Ordered in ED Medications  oxyCODONE-acetaminophen (PERCOCET/ROXICET) 5-325 MG per tablet 1 tablet (1 tablet Oral Given 02/10/19 1917)    ED Course  I have reviewed the triage vital signs and the nursing notes.  Pertinent labs & imaging results that were available during my care of the patient were reviewed by me and considered in my medical decision making (see chart for details).  Clinical Course as of Feb 09 2017  Sat Feb 10, 2019  1951 Spoke to Dr. Claudia Desanctis with hand surgery who viewed the films and will see patient in the clinic on Thursday.   [CC]    Clinical Course User Index [CC] Cheek, Comer Locket, PA-C   MDM Rules/Calculators/A&P     CHA2DS2/VAS Stroke Risk Points      N/A >= 2 Points: High Risk  1 - 1.99 Points: Medium Risk  0 Points: Low Risk    A final score could not be computed because of missing components.: Last  Change: N/A     This score determines the patient's risk of having a stroke if the  patient has atrial fibrillation.      This score is not applicable to this patient. Components are not  calculated.                   58 year old female presents to the ED for evaluation of right forearm and wrist pain after a mechanical fall.  Vitals all within normal limits, except elevated BP at 141/62 likely due to pain.  Patient in no acute distress, but appears very uncomfortable.  Tenderness to palpation over right distal aspect of radius and ulna with overlying edema.  Tenderness to palpation over right wrist and proximal portion of hand.  Very minimal range of motion of wrist and fingers due to pain.  Radial pulse intact.  Slight decrease in sensation of distal forearm and hand.  No snuffbox tenderness. X-ray personally reviewed which demonstrates mildly displaced oblique  fracture at right radial head with possible intra-articular extension.  Will place patient in sugar tong splint and sling.  Will treat with short course of Norco. Check [place] drug database prior to prescribing. Spoke to Dr. Claudia Desanctis with hand surgery. See note above.  Patient advised to take over-the-counter ibuprofen or Tylenol for moderate pain and save the opiates for severe pain.  Patient instructed to call Dr. Keane Scrape office on Monday to schedule an appointment for further evaluation.  Patient advised medication can cause drowsiness and to not drive or operate machinery while on the medication. Strict ED precautions discussed with patient. Patient states understanding and agrees to plan. Patient discharged home in no acute distress and stable vitals  Final Clinical Impression(s) / ED Diagnoses Final diagnoses:  Closed displaced fracture of head of right radius, initial encounter    Rx / DC Orders ED Discharge Orders         Ordered    HYDROcodone-acetaminophen (NORCO/VICODIN) 5-325 MG tablet  Every 6 hours PRN     02/10/19 1920           Romie Levee 02/10/19 2019    Margette Fast, MD 02/11/19 [phone]

## 2019-02-14 DIAGNOSIS — C711 Malignant neoplasm of frontal lobe: Secondary | ICD-10-CM | POA: Diagnosis not present

## 2019-02-14 DIAGNOSIS — F411 Generalized anxiety disorder: Secondary | ICD-10-CM | POA: Diagnosis not present

## 2019-02-14 DIAGNOSIS — G894 Chronic pain syndrome: Secondary | ICD-10-CM | POA: Diagnosis not present

## 2019-02-14 DIAGNOSIS — G4700 Insomnia, unspecified: Secondary | ICD-10-CM | POA: Diagnosis not present

## 2019-02-15 ENCOUNTER — Other Ambulatory Visit: Payer: Self-pay

## 2019-02-15 ENCOUNTER — Ambulatory Visit (INDEPENDENT_AMBULATORY_CARE_PROVIDER_SITE_OTHER): Payer: Medicare HMO | Admitting: Plastic Surgery

## 2019-02-15 DIAGNOSIS — S52571A Other intraarticular fracture of lower end of right radius, initial encounter for closed fracture: Secondary | ICD-10-CM | POA: Diagnosis not present

## 2019-02-15 NOTE — Progress Notes (Signed)
Referring Provider Celene Squibb, MD [place],   [id number]   CC: No chief complaint on file. Nondisplaced right distal radius fracture  Jill Harrell is an 58 y.o. female.  HPI: Patient presents as an ER follow-up for the nondisplaced right distal radius fracture.  She fell this past weekend and presented with wrist pain to the emergency room.  She was placed in a sugar tong splint and sent to me for follow-up.  She said in the interim she has pain with moving her fingers but can move them and can feel them as well.  She reports she has terminal brain cancer.  No Known Allergies  Outpatient Encounter Medications as of 02/15/2019  Medication Sig Note  . ALPRAZolam (XANAX) 1 MG tablet Take 1 tablet by mouth every 6 (six) hours as needed.   . budesonide-formoterol (SYMBICORT) 160-4.5 MCG/ACT inhaler Inhale 2 puffs into the lungs 2 (two) times daily.   . divalproex (DEPAKOTE) 500 MG DR tablet Take 3 tablets (1,500 mg total) by mouth at bedtime. (Patient taking differently: Take 1,000 mg by mouth at bedtime. ) 12/05/2018: Reports only taking 1,000 at bedtime. Smith,Kimberly N, RN 12/05/18 12:18 PM      . DULoxetine (CYMBALTA) 60 MG capsule TAKE 1 CAPSULE BY MOUTH ONCE DAILY   . Estradiol (DIVIGEL) 1 MG/GM GEL Place 1 packet onto the skin daily.   Marland Kitchen HYDROcodone-acetaminophen (NORCO) 10-325 MG tablet Take 1 tablet by mouth every 6 (six) hours as needed (pain).   Marland Kitchen HYDROcodone-acetaminophen (NORCO/VICODIN) 5-325 MG tablet Take 1 tablet by mouth every 6 (six) hours as needed for up to 5 days for severe pain.   Marland Kitchen lubiprostone (AMITIZA) 24 MCG capsule TAKE ONE CAPSULE BY MOUTH TWICE DAILY WITH  A  MEAL (Patient not taking: Reported on 12/05/2018) 12/05/2018: Normally takes daily, but having insurance problems with coverage right now. Smith,Kimberly N, RN 12/05/18 12:19 PM      . methadone (DOLOPHINE) 10 MG tablet Take 1 tablet (10 mg total) by mouth every 8 (eight) hours. (Patient  taking differently: Take 20 mg by mouth every 6 (six) hours. )   . mirabegron ER (MYRBETRIQ) 50 MG TB24 tablet Take 1 tablet (50 mg total) by mouth daily.   . ondansetron (ZOFRAN) 4 MG tablet TAKE 1 TABLET(4 MG) BY MOUTH EVERY 8 HOURS AS NEEDED FOR NAUSEA OR VOMITING   . pantoprazole (PROTONIX) 40 MG tablet Take 1 tablet (40 mg total) by mouth 2 (two) times daily before a meal.   . prochlorperazine (COMPAZINE) 10 MG tablet Take 1 tablet by mouth every 6 (six) hours as needed.    . zolpidem (AMBIEN) 10 MG tablet TK 1 T PO QD HS    No facility-administered encounter medications on file as of 02/15/2019.     Past Medical History:  Diagnosis Date  . Anxiety   . Arthritis    hip and wrist  . Brain cancer ([place])    2009 glioblastoma (stage 4) Dr. Bayard Hugger  . Cancer ([place])   . Degenerative disc disease at L5-S1 level   . GERD (gastroesophageal reflux disease)   . Seizures ([place])    brain cancer    Past Surgical History:  Procedure Laterality Date  . ABDOMINAL HYSTERECTOMY    . APPENDECTOMY    . BIOPSY  04/24/2018   Procedure: BIOPSY;  Surgeon: Daneil Dolin, MD;  Location: [facility];  Service: Endoscopy;;  gastric  . BRAIN SURGERY X2    .  CHOLECYSTECTOMY    . ESOPHAGOGASTRODUODENOSCOPY (EGD) WITH PROPOFOL N/A 04/24/2018   Dr. Gala Romney: Normal esophagus.  Status post dilation for history of dysphagia.  Diffuse moderate mucosal changes in the entire stomach consistent with portal hypertensive gastropathy.  Biopsy with chronic inactive gastritis, no H. pylori.  Marland Kitchen MALONEY DILATION N/A 04/24/2018   Procedure: Venia Minks DILATION;  Surgeon: Daneil Dolin, MD;  Location[facility];  Service: Endoscopy;  Laterality: N/A;    Family History  Adopted: Yes  Problem Relation Age of Onset  . Heart disease Father   . Hypotension Father   . Diabetes Father   . Allergic rhinitis Father   . Hyperlipidemia Maternal Grandfather   . Hypertension Maternal Grandfather   . Heart disease  Maternal Grandfather   . Allergic rhinitis Maternal Grandfather   . Allergic rhinitis Mother   . Allergic rhinitis Maternal Grandmother   . Lymphoma Maternal Grandmother   . Asthma Brother   . Allergic rhinitis Brother   . Asthma Brother   . Allergic rhinitis Brother   . COPD Brother   . Allergic rhinitis Brother   . Angioedema Neg Hx   . Atopy Neg Hx   . Eczema Neg Hx   . Immunodeficiency Neg Hx   . Urticaria Neg Hx   . Colon cancer Neg Hx     Social History   Social History Narrative  . Not on file     Review of Systems General: Denies fevers, chills, weight loss CV: Denies chest pain, shortness of breath, palpitations  Physical Exam Vitals with BMI 02/10/2019 12/05/2018 04/24/2018  Height 5\' 4"  5\' 4"  -  Weight 157 lbs 160 lbs 11 oz -  BMI 0000000 [id number] -  Systolic [id number] 93  Diastolic 62 56 37  Pulse 71 71 69    General:  No acute distress,  Alert and oriented, Non-Toxic, Normal speech and affect Right upper extremity: Fingers are well perfused with normal capillary refill and a palp radial pulse.  Sensation is intact throughout.  She can flex and extend her fingers.  She still in a sugar tong splint.  Imaging was reviewed and shows a right distal radius fracture that is intra-articular.  This appears to primarily consist of a large radial styloid fragment.  It is nondisplaced  Assessment/Plan We will plan to get another x-ray and confirmed that the alignment is maintained.  If remains looking as it did previously we will plan for nonoperative treatment of this fracture.  It remains nondisplaced I will have her follow-up with hand therapy for a removable splint.  At that point I will see her back in another 2 weeks or so for a repeat x-ray.  I will plan on calling her with the results of the films that she gets today or in the next couple days.  She seems fully understanding of the plan and in agreement.  Cindra Presume 02/15/2019, 12:14 PM

## 2019-02-27 ENCOUNTER — Other Ambulatory Visit: Payer: Self-pay

## 2019-02-27 ENCOUNTER — Ambulatory Visit (HOSPITAL_COMMUNITY)
Admission: RE | Admit: 2019-02-27 | Discharge: 2019-02-27 | Disposition: A | Discharge status: Home / Self Care | Payer: Medicare HMO | Source: Ambulatory Visit | Attending: Plastic Surgery | Admitting: Plastic Surgery

## 2019-02-27 DIAGNOSIS — S52501D Unspecified fracture of the lower end of right radius, subsequent encounter for closed fracture with routine healing: Secondary | ICD-10-CM | POA: Diagnosis not present

## 2019-02-27 DIAGNOSIS — S52571A Other intraarticular fracture of lower end of right radius, initial encounter for closed fracture: Secondary | ICD-10-CM | POA: Diagnosis not present

## 2019-03-07 ENCOUNTER — Other Ambulatory Visit: Payer: Self-pay

## 2019-03-07 ENCOUNTER — Ambulatory Visit: Payer: Medicare HMO | Attending: Plastic Surgery | Admitting: *Deleted

## 2019-03-07 ENCOUNTER — Encounter: Payer: Self-pay | Admitting: *Deleted

## 2019-03-07 DIAGNOSIS — M25541 Pain in joints of right hand: Secondary | ICD-10-CM | POA: Diagnosis not present

## 2019-03-07 DIAGNOSIS — R278 Other lack of coordination: Secondary | ICD-10-CM

## 2019-03-07 DIAGNOSIS — M25531 Pain in right wrist: Secondary | ICD-10-CM

## 2019-03-07 NOTE — Therapy (Signed)
[facility] [address] [place], [place], [address] Phone: [phone]   Fax:  [phone]  Occupational Therapy Evaluation  Patient Details  Name: Jill Harrell MRN: [id number] Date of Birth: 05-Nov-1960 Referring Provider (OT): Dr Keane Scrape. Silverio Lay   Encounter Date: 03/07/2019  OT End of Session - 03/07/19 1106    Visit Number  1    Number of Visits  3    Date for OT Re-Evaluation  04/18/19    Authorization Type  [employer]    OT Start Time  0934    OT Stop Time  1100    OT Time Calculation (min)  86 min       Past Medical History:  Diagnosis Date  . Anxiety   . Arthritis    hip and wrist  . Brain cancer ([place])    2009 glioblastoma (stage 4) Dr. Bayard Hugger  . Cancer ([place])   . Degenerative disc disease at L5-S1 level   . GERD (gastroesophageal reflux disease)   . Seizures ([place])    brain cancer    Past Surgical History:  Procedure Laterality Date  . ABDOMINAL HYSTERECTOMY    . APPENDECTOMY    . BIOPSY  04/24/2018   Procedure: BIOPSY;  Surgeon: Daneil Dolin, MD;  Location: [facility];  Service: Endoscopy;;  gastric  . BRAIN SURGERY X2    . CHOLECYSTECTOMY    . ESOPHAGOGASTRODUODENOSCOPY (EGD) WITH PROPOFOL N/A 04/24/2018   Dr. Gala Romney: Normal esophagus.  Status post dilation for history of dysphagia.  Diffuse moderate mucosal changes in the entire stomach consistent with portal hypertensive gastropathy.  Biopsy with chronic inactive gastritis, no H. pylori.  Marland Kitchen MALONEY DILATION N/A 04/24/2018   Procedure: Venia Minks DILATION;  Surgeon: Daneil Dolin, MD;  Location: [facility];  Service: Endoscopy;  Laterality: N/A;    There were no vitals filed for this visit.  Subjective Assessment - 03/07/19 0947    Subjective   Pt reports falling at home and landing on r wrist on 02/10/2019 when she sustained a right non-displaced distal radius fracture. She is referred by Dr Claudia Desanctis for a custom right volar  wrist splint.    Pertinent History  terminal brain cancer; seiures, severe anxiety. Please see patient chart for full PMH.  Pt reports that her biological mother told her  recently that she fractured her right wrist at 59 y/o as well.    Currently in Pain?  Yes    Pain Score  8     Pain Location  Wrist    Pain Orientation  Right    Pain Descriptors / Indicators  Throbbing;Constant;Aching    Pain Type  Acute pain        [facility] OT Assessment - 03/07/19 0001      Assessment   Medical Diagnosis  Right Non-displaced distal radius fracture    Referring Provider (OT)  Dr Keane Scrape. [place]    Onset Date/Surgical Date  02/10/19    Hand Dominance  Right    Next MD Visit  --   Pt reports that she needs to schedule f/u w/ MD     Precautions   Precautions  Fall      Balance Screen   Has the patient fallen in the past 6 months  Yes    How many times?  1 time    Has the patient had a decrease in activity level because of a fear of falling?   No  Home  Environment   Family/patient expects to be discharged to:  Private residence    Lives With  Significant other   Pt husband assists with ADL's, IADL's etc     Prior Function   Level of Independence  Needs assistance with ADLs;Needs assistance with homemaking    Vocation  On disability   Was a paralegal until brain cancer dx   Leisure  Reading, walking. Used to crochet      ADL   Eating/Feeding  Modified independent   Increased time. Husband assists w/ ADL's   ADL comments  Husband assists with ADL's and IADL's since cancer diagnosis      Mobility   Mobility Status  Independent      Written Expression   Dominant Hand  Right      Vision - History   Baseline Vision  Wears glasses all the time      Cognition   Cognition Comments  Pt reports STM difficulty. Difficulty with word finding and short term memory      Observation/Other Assessments   Observations  Pt presents in post op cast for right non-displaced right distal radius  fracture.      Sensation   Light Touch  Impaired by gross assessment   Pt reports paraesthesias in index and middle fingers right              OT Treatments/Exercises (OP) - 03/07/19 0001      Splinting   Splinting  Pt's post-op/splint as fabricated in MD office was removed in the clinic today taking care to keep her elbow/forearm supported. She was noted to have a small blister on her right volar thenar eminence. Pt reported that she had "noticed some soreness but was not able to see it" was rubbing. A 2x2 and some gaue was placed over the blister for soem added portection and pt was educated to monitor and let the MD know of this at her next appointment. She was then fitted with a custom fabricated volr wrist splint for her right displaced distal radius fracture. She was educated in splintin use, care and precautions and will wearing the splint at all times for protection and to prevent movement. He doctor will progress her program PRN. Pt was scheduled for 1x f/u for splinting check and adjustments PRN and may cancel this appointment if she feels as though the splint is well fitting. She was given written and verbal instructions in all of the above and verbalied understanding in the clinic today.       WEARING SCHEDULE:  Wear splint at ALL times except for hygiene care (Do Not Remove Splint until you are cleared to do so by your doctor). Wear splint at all times. Do not use your hand for anything (no lifting, pushing, pulling) until you are cleared by your doctor.  PURPOSE:  To prevent movement and for protection until injury can heal.  CARE OF SPLINT:  Keep splint away from heat sources including: stove, radiator or furnace, or a car in sunlight. The splint can melt and will no longer fit you properly  Keep away from pets and children  Clean the splint with rubbing alcohol.  * During this time, make sure you also clean your hand/arm as instructed by your therapist and/or perform  dressing changes as needed. Then dry hand/arm completely before replacing splint. (When cleaning hand/arm, keep it immobilized in same position until splint is replaced)  PRECAUTIONS/POTENTIAL PROBLEMS:  *If you notice or experience increased pain,  swelling, numbness, or a lingering reddened area from the splint:  Contact your therapist immediately by calling [phone]. You must wear the splint for protection, but we will get you scheduled for adjustments as quickly as possible.  (If only straps or hooks need to be replaced and NO adjustments to the splint need to be made, just call the office ahead and let them know you are coming in)  If you have any medical concerns or signs of infection, please call your doctor immediately  Call your doctor to schedule a follow up appointment and continuation of your program/healing process.  You may move your fingers and thumb within the splint, try to make a fist and touch your thumb to each finger tip.        OT Education - 03/07/19 1104    Education Details  Splint use, care and precautions. Elevate forearm, wear splint at all times for protection. Pt may perform gentle Active range of motion to fingers and thumb within confines of splint. No functional use R hand/wrist.    Person(s) Educated  Patient    Methods  Explanation;Demonstration;Verbal cues;Handout    Comprehension  Verbalized understanding       OT Short Term Goals - 03/07/19 1117      OT SHORT TERM GOAL #1   Title  STG = LTG's: Pt will be Mod I splinting use, care and precautions R volar wrist splint following displaced distal radius fracture on 02/10/2019.    Baseline  Min verbal and tactile cues to don/doff splint R wrist    Time  4    Period  Weeks    Status  New    Target Date  04/05/19               Plan - 03/07/19 1107    Clinical Impression Statement  Pt is a pleasant 59 y/o R hand dominant female s/p R displaced distal radius fracture after falling at home  while vaccuming on 02/10/2019. She is currently 3 weeks and 4 days s/p injury. She has a significant PMH to include terminal brain cancer, seiures. severe anxiety. Please refer to [place] in chart for complete medical history. She was referred today by Dr Cindra Presume for a protective custom volar wrist splint right. She was fitted with a splint and instructed to f/u with MD for progression of program. She will schedule a f/u appointment with this clinic in next 7-10 days for splint chack and adjustments as needed (and will cancel if not needed per discussion in clinic today).    OT Occupational Profile and History  Detailed Assessment- Review of Records and additional review of physical, cognitive, psychosocial history related to current functional performance    Occupational performance deficits (Please refer to evaluation for details):  ADL's;IADL's;Leisure    Rehab Potential  Good    Clinical Decision Making  Limited treatment options, no task modification necessary    Comorbidities Affecting Occupational Performance:  Presence of comorbidities impacting occupational performance    Modification or Assistance to Complete Evaluation   No modification of tasks or assist necessary to complete eval    OT Frequency  Other (comment)   Pt will follow up with OT up to 3 visits over next 4 weeks for splint check and adjustments as needed.   OT Duration  4 weeks    OT Treatment/Interventions  Splinting    Plan  Splint check and adjustments as needed (up to 3 visits) over next 4 weeks. Pt  will otherwise f/u w/ MD for progression of program.    Recommended Other Services  F/u with Dr Claudia Desanctis for progression of program    Consulted and Agree with Plan of Care  Patient       Patient will benefit from skilled therapeutic intervention in order to improve the following deficits and impairments:           Visit Diagnosis: Pain in right wrist - Plan: Ot plan of care cert/re-cert  Other lack of coordination -  Plan: Ot plan of care cert/re-cert  Pain in joint of right hand - Plan: Ot plan of care cert/re-cert    Problem List Patient Active Problem List   Diagnosis Date Noted  . Medication overuse headache 12/05/2018  . GERD (gastroesophageal reflux disease) 06/29/2018  . Esophageal dysphagia 02/16/2018  . Constipation 01/07/2016  . Abdominal pain, periumbilical [id number]  . Aortic atherosclerosis ([place]) 12/11/2015  . 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 Coughing 07/07/2015  . Dyspnea 07/07/2015  . Glioblastoma determined by biopsy of brain ([place]) 04/21/2015  . Migraines 04/21/2015  . Seizures ([place]) 04/10/2015  . Symptomatic menopausal or female climacteric states 08/20/2013    Almyra Deforest 03/07/2019, 11:31 AM  [facility] [address] [place], [place], [address] Phone: [phone]   Fax:  [phone]  Name: Jill Harrell MRN: [id number] Date of Birth: Jun 30, 1960

## 2019-03-07 NOTE — Patient Instructions (Signed)
WEARING SCHEDULE:  Wear splint at ALL times except for hygiene care (Do Not Remove Splint until you are cleared to do so by your doctor). Wear splint at all times. Do not use your hand for anything (no lifting, pushing, pulling) until you are cleared by your doctor.  PURPOSE:  To prevent movement and for protection until injury can heal.  CARE OF SPLINT:  Keep splint away from heat sources including: stove, radiator or furnace, or a car in sunlight. The splint can melt and will no longer fit you properly  Keep away from pets and children  Clean the splint with rubbing alcohol.  * During this time, make sure you also clean your hand/arm as instructed by your therapist and/or perform dressing changes as needed. Then dry hand/arm completely before replacing splint. (When cleaning hand/arm, keep it immobilized in same position until splint is replaced)  PRECAUTIONS/POTENTIAL PROBLEMS: *If you notice or experience increased pain, swelling, numbness, or a lingering reddened area from the splint: Contact your therapist immediately by calling [phone]. You must wear the splint for protection, but we will get you scheduled for adjustments as quickly as possible.  (If only straps or hooks need to be replaced and NO adjustments to the splint need to be made, just call the office ahead and let them know you are coming in)  If you have any medical concerns or signs of infection, please call your doctor immediately  Call your doctor to schedule a follow up appointment and continuation of your program/healing process.  You may move your fingers and thumb within the splint, try to make a fist and touch your thumb to each finger tip.

## 2019-03-09 ENCOUNTER — Telehealth: Payer: Self-pay | Admitting: Plastic Surgery

## 2019-03-09 NOTE — Telephone Encounter (Signed)
Pt got a splint placed on the 6th but wants to know next steps. Please call her husband Nicole Kindred back and give him the message because she has issues remembering.

## 2019-03-12 NOTE — Telephone Encounter (Signed)
Called and spoke with the patient on (Friday-03/09/19) regarding the message below.  Informed the patient that I looked at the notes, and her next step is to make an appointment with Dr. Claudia Desanctis.  Informed her she will need to call back on Monday to make the appointment.  Patient verbalized understanding and agreed.//AB/CMA

## 2019-03-16 ENCOUNTER — Ambulatory Visit: Payer: Medicare HMO | Admitting: Occupational Therapy

## 2019-03-16 ENCOUNTER — Other Ambulatory Visit (INDEPENDENT_AMBULATORY_CARE_PROVIDER_SITE_OTHER): Payer: Medicare HMO | Admitting: Plastic Surgery

## 2019-03-16 DIAGNOSIS — S52571A Other intraarticular fracture of lower end of right radius, initial encounter for closed fracture: Secondary | ICD-10-CM

## 2019-03-16 NOTE — Progress Notes (Signed)
na

## 2019-03-21 DIAGNOSIS — F411 Generalized anxiety disorder: Secondary | ICD-10-CM | POA: Diagnosis not present

## 2019-03-21 DIAGNOSIS — G894 Chronic pain syndrome: Secondary | ICD-10-CM | POA: Diagnosis not present

## 2019-03-21 DIAGNOSIS — G4700 Insomnia, unspecified: Secondary | ICD-10-CM | POA: Diagnosis not present

## 2019-03-21 DIAGNOSIS — C711 Malignant neoplasm of frontal lobe: Secondary | ICD-10-CM | POA: Diagnosis not present

## 2019-03-22 ENCOUNTER — Telehealth: Payer: Self-pay | Admitting: *Deleted

## 2019-03-22 ENCOUNTER — Ambulatory Visit: Payer: Medicare HMO | Admitting: Plastic Surgery

## 2019-03-22 ENCOUNTER — Telehealth: Payer: Self-pay | Admitting: Obstetrics & Gynecology

## 2019-03-22 MED ORDER — DIVIGEL 1 MG/GM TD GEL: 1.0000 | Freq: Every day | TRANSDERMAL | 11 refills | Status: DC

## 2019-03-22 NOTE — Telephone Encounter (Signed)
Pt requesting that new rx for divigel be sent to [facility], [facility] will not transfer it.

## 2019-03-23 ENCOUNTER — Ambulatory Visit (HOSPITAL_COMMUNITY)
Admission: RE | Admit: 2019-03-23 | Discharge: 2019-03-23 | Disposition: A | Discharge status: Home / Self Care | Payer: Medicare HMO | Source: Ambulatory Visit | Attending: Plastic Surgery | Admitting: Plastic Surgery

## 2019-03-23 ENCOUNTER — Telehealth: Payer: Self-pay | Admitting: *Deleted

## 2019-03-23 ENCOUNTER — Encounter (HOSPITAL_COMMUNITY): Payer: Self-pay

## 2019-03-23 ENCOUNTER — Other Ambulatory Visit: Payer: Self-pay

## 2019-03-23 DIAGNOSIS — S52571A Other intraarticular fracture of lower end of right radius, initial encounter for closed fracture: Secondary | ICD-10-CM | POA: Diagnosis not present

## 2019-03-23 DIAGNOSIS — S52591D Other fractures of lower end of right radius, subsequent encounter for closed fracture with routine healing: Secondary | ICD-10-CM | POA: Diagnosis not present

## 2019-03-23 NOTE — Telephone Encounter (Signed)
Patient informed prescription for Divigel has been sent to pharmacy.

## 2019-03-26 DIAGNOSIS — F331 Major depressive disorder, recurrent, moderate: Secondary | ICD-10-CM | POA: Diagnosis not present

## 2019-03-26 DIAGNOSIS — C719 Malignant neoplasm of brain, unspecified: Secondary | ICD-10-CM | POA: Diagnosis not present

## 2019-03-26 DIAGNOSIS — R569 Unspecified convulsions: Secondary | ICD-10-CM | POA: Diagnosis not present

## 2019-03-26 DIAGNOSIS — F419 Anxiety disorder, unspecified: Secondary | ICD-10-CM | POA: Diagnosis not present

## 2019-03-27 ENCOUNTER — Telehealth: Payer: Self-pay | Admitting: Plastic Surgery

## 2019-03-27 NOTE — Telephone Encounter (Signed)

## 2019-03-28 ENCOUNTER — Ambulatory Visit (INDEPENDENT_AMBULATORY_CARE_PROVIDER_SITE_OTHER): Payer: Medicare HMO | Admitting: Plastic Surgery

## 2019-03-28 ENCOUNTER — Encounter: Payer: Self-pay | Admitting: Plastic Surgery

## 2019-03-28 ENCOUNTER — Other Ambulatory Visit: Payer: Self-pay

## 2019-03-28 VITALS — BP 107/71 | HR 86 | Temp 97.3°F | Ht 64.0 in | Wt 157.2 lb

## 2019-03-28 DIAGNOSIS — S52571A Other intraarticular fracture of lower end of right radius, initial encounter for closed fracture: Secondary | ICD-10-CM

## 2019-03-28 NOTE — Progress Notes (Signed)
   Referring Provider Celene Squibb, MD [address] [name],  [place] [address]   CC:  Chief Complaint  Patient presents with  . Follow-up    on (R) wrist,pinky, and side knuckle still hurting      Jill Harrell is an 59 y.o. female.  HPI: Patient presents in follow-up for nondisplaced distal radius fracture.  She is about 6 weeks out from her injury.  She feels like things are going well but is still in pain with stiffness.  Recent x-ray shows good alignment of the fracture with signs of healing.  Review of Systems General: Denies fevers, chills, shortness of breath  Physical Exam Vitals with BMI 03/28/2019 02/10/2019 12/05/2018  Height 5\' 4"  5\' 4"  5\' 4"   Weight 157 lbs 3 oz 157 lbs 160 lbs 11 oz  BMI 26.97 0000000 [id number]  Systolic [id number] A999333  Diastolic 71 62 56  Pulse 86 71 71    General:  No acute distress,  Alert and oriented, Non-Toxic, Normal speech and affect Examination of the right hand shows no deformity.  She has good range of motion of her fingers.  She is hesitant to move his wrist due to pain and stiffness.  Assessment/Plan Patient is now 6 weeks out from a closed distal radius fracture.  I suspect this is mostly healed at this point.  I would like her to resume therapy to help with range of motion and strengthening as it appears she has gotten a little bit deconditioned due to her immobilization.  She was curious if this can be done out towards any pin and have asked her to ask our therapist if there is anything up there they could accommodate what she needs.  I will plan to see her again in about a month.  Cindra Presume 03/28/2019, 5:00 PM

## 2019-04-11 ENCOUNTER — Ambulatory Visit (HOSPITAL_COMMUNITY): Payer: Medicare HMO | Attending: Plastic Surgery

## 2019-04-11 ENCOUNTER — Other Ambulatory Visit: Payer: Self-pay

## 2019-04-11 ENCOUNTER — Encounter (HOSPITAL_COMMUNITY): Payer: Self-pay

## 2019-04-11 DIAGNOSIS — M25531 Pain in right wrist: Secondary | ICD-10-CM

## 2019-04-11 DIAGNOSIS — R278 Other lack of coordination: Secondary | ICD-10-CM

## 2019-04-11 DIAGNOSIS — M25541 Pain in joints of right hand: Secondary | ICD-10-CM

## 2019-04-11 DIAGNOSIS — M25631 Stiffness of right wrist, not elsewhere classified: Secondary | ICD-10-CM

## 2019-04-11 NOTE — Patient Instructions (Signed)
  AROM: Finger Flexion / Extension   Actively bend fingers of right hand. Start with knuckles furthest from palm, and slowly make a fist. Hold __5__ seconds. Relax. Then straighten fingers as far as possible. Repeat __10__ times per set. Do __1__ sets per session. Do _2-3___ sessions per day.  Copyright  VHI. All rights reserved.   Paper Crumpling Exercise   Begin with right palm down on piece of paper. Maintaining contact between surface and heel of hand, crumple paper into a ball. Repeat _5-10___ times per set. Do __1__ sets per session. Do _2-3___ sessions per day.  Copyright  VHI. All rights reserved.     Towel Roll Squeeze   With right forearm resting on surface, gently squeeze towel. Repeat _10___ times per set. Do __1__ sets per session. Do __2-3__ sessions per day.  Copyright  VHI. All rights reserved.   Abduction / Adduction (Active)    With hand flat on table, spread all fingers apart, then bring them together as close as possible. Repeat _10___ times. Do __2-3__ sessions per day.  Copyright  VHI. All rights reserved.  AROM: Thumb Abduction / Adduction   Actively pull right thumb away from palm as far as possible. Hold ___5_ seconds. Then bring thumb back to touch fingers. Try not to bend fingers toward thumb. Repeat _10___ times per set. Do ___1_ sets per session. Do ___2-3_ sessions per day.  Copyright  VHI. All rights reserved.    AROM: Wrist Flexion / Extension  Actively bend right wrist forward then back as far as possible. Repeat __10__ times per set. Do _1___ sets per session. Do __2-3__ sessions per day.  Copyright  VHI. All rights reserved.  AROM: Wrist Radial / Ulnar Deviation   Gently bend left wrist from side to side as far as possible. Repeat _10___ times per set. Do __1__ sets per session. Do _2-3___ sessions per day.  Copyright  VHI. All rights reserved.  AROM: Forearm Pronation / Supination   With right arm in handshake  position, slowly rotate palm down until stretch is felt. Relax. Then rotate palm up until stretch is felt. Repeat __10__ times per set. Do _1___ sets per session. Do __2-3__ sessions per day.  Copyright  VHI. All rights reserved.

## 2019-04-11 NOTE — Therapy (Signed)
[facility] [place], [place], [address] Phone: [phone]   Fax:  [phone]  Patient Details  Name: Jill Harrell MRN: [id number] Date of Birth: 10-16-1960 Referring Provider:  Cindra Presume, MD  Encounter Date: 04/11/2019 239PM-310PM  Pt arrived for evaluation. After initial interview and background information was obtained, therapist began to measure A/ROM and P/ROM of her right hand/wrist. Patient reports a severe migraine which was making it difficult to tolerate the pain and she felt like she would be sick. Discussed option of rescheduling if patient was not feeling her best. Evaluation was rescheduled for next week. Patient was provided with basic A/ROM exercises for her hand and wrist and educated on how to perform each one. She verbalized understanding.    Ailene Ravel, OTR/L,CBIS  [phone]  04/11/2019, 3:45 PM  [facility] [address] [place], [place], [address] Phone: [phone]   Fax:  [phone]

## 2019-04-17 ENCOUNTER — Other Ambulatory Visit: Payer: Medicare HMO | Admitting: Obstetrics & Gynecology

## 2019-04-18 DIAGNOSIS — G894 Chronic pain syndrome: Secondary | ICD-10-CM | POA: Diagnosis not present

## 2019-04-18 DIAGNOSIS — F411 Generalized anxiety disorder: Secondary | ICD-10-CM | POA: Diagnosis not present

## 2019-04-18 DIAGNOSIS — C711 Malignant neoplasm of frontal lobe: Secondary | ICD-10-CM | POA: Diagnosis not present

## 2019-04-18 DIAGNOSIS — G4700 Insomnia, unspecified: Secondary | ICD-10-CM | POA: Diagnosis not present

## 2019-04-19 ENCOUNTER — Ambulatory Visit (HOSPITAL_COMMUNITY): Payer: Medicare HMO | Admitting: Specialist

## 2019-05-02 ENCOUNTER — Ambulatory Visit: Payer: Medicare HMO | Admitting: Surgical

## 2019-05-03 ENCOUNTER — Encounter: Payer: Self-pay | Admitting: Obstetrics & Gynecology

## 2019-05-03 ENCOUNTER — Other Ambulatory Visit: Payer: Self-pay

## 2019-05-03 ENCOUNTER — Ambulatory Visit (INDEPENDENT_AMBULATORY_CARE_PROVIDER_SITE_OTHER): Payer: Medicare HMO | Admitting: Obstetrics & Gynecology

## 2019-05-03 VITALS — BP 107/59 | HR 73 | Ht 64.0 in | Wt 165.0 lb

## 2019-05-03 DIAGNOSIS — Z124 Encounter for screening for malignant neoplasm of cervix: Secondary | ICD-10-CM

## 2019-05-03 DIAGNOSIS — Z1211 Encounter for screening for malignant neoplasm of colon: Secondary | ICD-10-CM

## 2019-05-03 DIAGNOSIS — Z1212 Encounter for screening for malignant neoplasm of rectum: Secondary | ICD-10-CM | POA: Diagnosis not present

## 2019-05-03 DIAGNOSIS — Z90710 Acquired absence of both cervix and uterus: Secondary | ICD-10-CM

## 2019-05-03 DIAGNOSIS — Z01419 Encounter for gynecological examination (general) (routine) without abnormal findings: Secondary | ICD-10-CM | POA: Diagnosis not present

## 2019-05-03 MED ORDER — MIRABEGRON ER 50 MG PO TB24: 50.0000 mg | ORAL_TABLET | Freq: Every day | ORAL | 3 refills | Status: DC

## 2019-05-03 MED ORDER — ESTRADIOL 0.1 MG/24HR TD PTTW: 1.0000 | MEDICATED_PATCH | TRANSDERMAL | 12 refills | Status: DC

## 2019-05-03 MED ORDER — ESTRADIOL 0.1 MG/24HR TD PTTW: 1.0000 | MEDICATED_PATCH | TRANSDERMAL | 3 refills | Status: DC

## 2019-05-03 NOTE — Progress Notes (Signed)
Subjective:     Jill Harrell is a 59 y.o. female here for a routine exam.  No LMP recorded. Patient has had a hysterectomy. [id number] Birth Control Method:  hysterectomy Menstrual Calendar(currently): amenorrheic  Current complaints: none.   Current acute medical issues:  none   Recent Gynecologic History No LMP recorded. Patient has had a hysterectomy. Last Pap: ,   Last mammogram: 2020,  normal  Past Medical History:  Diagnosis Date  . Anxiety   . Arthritis    hip and wrist  . Brain cancer ([place])    2009 glioblastoma (stage 4) Dr. Bayard Hugger  . Cancer ([place])   . Degenerative disc disease at L5-S1 level   . GERD (gastroesophageal reflux disease)   . Seizures ([place])    brain cancer    Past Surgical History:  Procedure Laterality Date  . ABDOMINAL HYSTERECTOMY    . APPENDECTOMY    . BIOPSY  04/24/2018   Procedure: BIOPSY;  Surgeon: Daneil Dolin, MD;  Location: [facility];  Service: Endoscopy;;  gastric  . BRAIN SURGERY X2    . CHOLECYSTECTOMY    . ESOPHAGOGASTRODUODENOSCOPY (EGD) WITH PROPOFOL N/A 04/24/2018   Dr. Gala Romney: Normal esophagus.  Status post dilation for history of dysphagia.  Diffuse moderate mucosal changes in the entire stomach consistent with portal hypertensive gastropathy.  Biopsy with chronic inactive gastritis, no H. pylori.  Marland Kitchen MALONEY DILATION N/A 04/24/2018   Procedure: Venia Minks DILATION;  Surgeon: Daneil Dolin, MD;  Location: [facility];  Service: Endoscopy;  Laterality: N/A;    OB History    Gravida  4   Para  0   Term      Preterm      AB  4   Living  0     SAB  4   TAB      Ectopic      Multiple      Live Births              Social History   Socioeconomic History  . Marital status: Married    Spouse name: Not on file  . Number of children: Not on file  . Years of education: Not on file  . Highest education level: Not on file  Occupational History  . Not on file  Tobacco Use  . Smoking status: Current  Every Day Smoker    Packs/day: 1.00    Years: 23.00    Pack years: 23.00    Types: Cigarettes  . Smokeless tobacco: Never Used  Substance and Sexual Activity  . Alcohol use: Yes    Alcohol/week: 0.0 standard drinks    Comment: once a year  . Drug use: No  . Sexual activity: Yes    Birth control/protection: Other-see comments  Other Topics Concern  . Not on file  Social History Narrative  . Not on file   Social Determinants of Health   Financial Resource Strain:   . Difficulty of Paying Living Expenses: Not on file  Food Insecurity:   . Worried About Charity fundraiser in the Last Year: Not on file  . Ran Out of Food in the Last Year: Not on file  Transportation Needs:   . Lack of Transportation (Medical): Not on file  . Lack of Transportation (Non-Medical): Not on file  Physical Activity:   . Days of Exercise per Week: Not on file  . Minutes of Exercise per Session: Not on file  Stress:   .  Feeling of Stress : Not on file  Social Connections:   . Frequency of Communication with Friends and Family: Not on file  . Frequency of Social Gatherings with Friends and Family: Not on file  . Attends Religious Services: Not on file  . Active Member of Clubs or Organizations: Not on file  . Attends Archivist Meetings: Not on file  . Marital Status: Not on file    Family History  Adopted: Yes  Problem Relation Age of Onset  . Heart disease Father   . Hypotension Father   . Diabetes Father   . Allergic rhinitis Father   . Hyperlipidemia Maternal Grandfather   . Hypertension Maternal Grandfather   . Heart disease Maternal Grandfather   . Allergic rhinitis Maternal Grandfather   . Allergic rhinitis Mother   . Allergic rhinitis Maternal Grandmother   . Lymphoma Maternal Grandmother   . Asthma Brother   . Allergic rhinitis Brother   . Asthma Brother   . Allergic rhinitis Brother   . COPD Brother   . Allergic rhinitis Brother   . Angioedema Neg Hx   . Atopy Neg  Hx   . Eczema Neg Hx   . Immunodeficiency Neg Hx   . Urticaria Neg Hx   . Colon cancer Neg Hx      Current Outpatient Medications:  .  ALPRAZolam (XANAX) 1 MG tablet, Take 1 tablet by mouth every 6 (six) hours as needed., Disp: , Rfl:  .  budesonide-formoterol (SYMBICORT) 160-4.5 MCG/ACT inhaler, Inhale 2 puffs into the lungs 2 (two) times daily., Disp: 1 Inhaler, Rfl: 11 .  divalproex (DEPAKOTE) 500 MG DR tablet, Take 3 tablets (1,500 mg total) by mouth at bedtime. (Patient taking differently: Take 1,000 mg by mouth at bedtime. ), Disp: 90 tablet, Rfl: 3 .  DULoxetine (CYMBALTA) 60 MG capsule, TAKE 1 CAPSULE BY MOUTH ONCE DAILY, Disp: 90 capsule, Rfl: 3 .  HYDROcodone-acetaminophen (NORCO) 10-325 MG tablet, Take 1 tablet by mouth every 6 (six) hours as needed (pain)., Disp: 120 tablet, Rfl: 0 .  lubiprostone (AMITIZA) 24 MCG capsule, TAKE ONE CAPSULE BY MOUTH TWICE DAILY WITH  A  MEAL, Disp: 180 capsule, Rfl: 3 .  methadone (DOLOPHINE) 10 MG tablet, Take 1 tablet (10 mg total) by mouth every 8 (eight) hours. (Patient taking differently: Take 20 mg by mouth every 6 (six) hours. ), Disp: 90 tablet, Rfl: 0 .  mirabegron ER (MYRBETRIQ) 50 MG TB24 tablet, Take 1 tablet (50 mg total) by mouth daily., Disp: 90 tablet, Rfl: 3 .  Multiple Vitamin (MULTIVITAMIN) tablet, Take 1 tablet by mouth daily., Disp: , Rfl:  .  Omega-3 Fatty Acids (FISH OIL) 1000 MG CAPS, Take by mouth., Disp: , Rfl:  .  ondansetron (ZOFRAN) 4 MG tablet, TAKE 1 TABLET(4 MG) BY MOUTH EVERY 8 HOURS AS NEEDED FOR NAUSEA OR VOMITING, Disp: 30 tablet, Rfl: 2 .  pantoprazole (PROTONIX) 40 MG tablet, Take 1 tablet (40 mg total) by mouth 2 (two) times daily before a meal., Disp: 60 tablet, Rfl: 3 .  zolpidem (AMBIEN) 10 MG tablet, TK 1 T PO QD HS, Disp: , Rfl: 0 .  estradiol (VIVELLE-DOT) 0.1 MG/24HR patch, Place 1 patch (0.1 mg total) onto the skin 2 (two) times a week., Disp: 24 patch, Rfl: 3  Review of Systems  Review of Systems   Constitutional: Negative for fever, chills, weight loss, malaise/fatigue and diaphoresis.  HENT: Negative for hearing loss, ear pain, nosebleeds, congestion, sore throat, neck  pain, tinnitus and ear discharge.   Eyes: Negative for blurred vision, double vision, photophobia, pain, discharge and redness.  Respiratory: Negative for cough, hemoptysis, sputum production, shortness of breath, wheezing and stridor.   Cardiovascular: Negative for chest pain, palpitations, orthopnea, claudication, leg swelling and PND.  Gastrointestinal: negative for abdominal pain. Negative for heartburn, nausea, vomiting, diarrhea, constipation, blood in stool and melena.  Genitourinary: Negative for dysuria, urgency, frequency, hematuria and flank pain.  Musculoskeletal: Negative for myalgias, back pain, joint pain and falls.  Skin: Negative for itching and rash.  Neurological: Negative for dizziness, tingling, tremors, sensory change, speech change, focal weakness, seizures, loss of consciousness, weakness and headaches.  Endo/Heme/Allergies: Negative for environmental allergies and polydipsia. Does not bruise/bleed easily.  Psychiatric/Behavioral: Negative for depression, suicidal ideas, hallucinations, memory loss and substance abuse. The patient is not nervous/anxious and does not have insomnia.        Objective:  Blood pressure (!) 107/59, pulse 73, height 5\' 4"  (1.626 m), weight 165 lb (74.8 kg).   Physical Exam  Vitals reviewed. Constitutional: She is oriented to person, place, and time. She appears well-developed and well-nourished.  HENT:  Head: Normocephalic and atraumatic.        Right Ear: External ear normal.  Left Ear: External ear normal.  Nose: Nose normal.  Mouth/Throat: Oropharynx is clear and moist.  Eyes: Conjunctivae and EOM are normal. Pupils are equal, round, and reactive to light. Right eye exhibits no discharge. Left eye exhibits no discharge. No scleral icterus.  Neck: Normal range of  motion. Neck supple. No tracheal deviation present. No thyromegaly present.  Cardiovascular: Normal rate, regular rhythm, normal heart sounds and intact distal pulses.  Exam reveals no gallop and no friction rub.   No murmur heard. Respiratory: Effort normal and breath sounds normal. No respiratory distress. She has no wheezes. She has no rales. She exhibits no tenderness.  GI: Soft. Bowel sounds are normal. She exhibits no distension and no mass. There is no tenderness. There is no rebound and no guarding.  Genitourinary:  Breasts no masses skin changes or nipple changes bilaterally      Vulva is normal without lesions Vagina is pink moist without discharge Cervix absent Uterus is absent Adnexa is negative with normal sized ovaries  {Rectal    hemoccult negative, normal tone, no masses  Musculoskeletal: Normal range of motion. She exhibits no edema and no tenderness.  Neurological: She is alert and oriented to person, place, and time. She has normal reflexes. She displays normal reflexes. No cranial nerve deficit. She exhibits normal muscle tone. Coordination normal.  Skin: Skin is warm and dry. No rash noted. No erythema. No pallor.  Psychiatric: She has a normal mood and affect. Her behavior is normal. Judgment and thought content normal.       Medications Ordered at today's visit: Meds ordered this encounter  Medications  . DISCONTD: estradiol (VIVELLE-DOT) 0.1 MG/24HR patch    Sig: Place 1 patch (0.1 mg total) onto the skin 2 (two) times a week.    Dispense:  8 patch    Refill:  12  . mirabegron ER (MYRBETRIQ) 50 MG TB24 tablet    Sig: Take 1 tablet (50 mg total) by mouth daily.    Dispense:  90 tablet    Refill:  3  . estradiol (VIVELLE-DOT) 0.1 MG/24HR patch    Sig: Place 1 patch (0.1 mg total) onto the skin 2 (two) times a week.    Dispense:  24 patch  Refill:  3    Other orders placed at today's visit: No orders of the defined types were placed in this  encounter.     Assessment:   Normal Gyn exam .    Plan:    continue topical estrogen switch to vivelle dot  Cont myrbetriq qhs     Return in about 1 year (around 05/02/2020) for yearly.

## 2019-05-07 DIAGNOSIS — C719 Malignant neoplasm of brain, unspecified: Secondary | ICD-10-CM | POA: Diagnosis not present

## 2019-05-07 DIAGNOSIS — Z0001 Encounter for general adult medical examination with abnormal findings: Secondary | ICD-10-CM | POA: Diagnosis not present

## 2019-05-07 DIAGNOSIS — F331 Major depressive disorder, recurrent, moderate: Secondary | ICD-10-CM | POA: Diagnosis not present

## 2019-05-16 DIAGNOSIS — C711 Malignant neoplasm of frontal lobe: Secondary | ICD-10-CM | POA: Diagnosis not present

## 2019-05-16 DIAGNOSIS — G894 Chronic pain syndrome: Secondary | ICD-10-CM | POA: Diagnosis not present

## 2019-05-16 DIAGNOSIS — G4700 Insomnia, unspecified: Secondary | ICD-10-CM | POA: Diagnosis not present

## 2019-05-16 DIAGNOSIS — F411 Generalized anxiety disorder: Secondary | ICD-10-CM | POA: Diagnosis not present

## 2019-06-20 DIAGNOSIS — F411 Generalized anxiety disorder: Secondary | ICD-10-CM | POA: Diagnosis not present

## 2019-06-20 DIAGNOSIS — C711 Malignant neoplasm of frontal lobe: Secondary | ICD-10-CM | POA: Diagnosis not present

## 2019-06-20 DIAGNOSIS — G894 Chronic pain syndrome: Secondary | ICD-10-CM | POA: Diagnosis not present

## 2019-06-20 DIAGNOSIS — G4700 Insomnia, unspecified: Secondary | ICD-10-CM | POA: Diagnosis not present

## 2019-06-29 DIAGNOSIS — M9902 Segmental and somatic dysfunction of thoracic region: Secondary | ICD-10-CM | POA: Diagnosis not present

## 2019-06-29 DIAGNOSIS — M545 Low back pain: Secondary | ICD-10-CM | POA: Diagnosis not present

## 2019-06-29 DIAGNOSIS — M546 Pain in thoracic spine: Secondary | ICD-10-CM | POA: Diagnosis not present

## 2019-06-29 DIAGNOSIS — M9903 Segmental and somatic dysfunction of lumbar region: Secondary | ICD-10-CM | POA: Diagnosis not present

## 2019-06-29 DIAGNOSIS — M9905 Segmental and somatic dysfunction of pelvic region: Secondary | ICD-10-CM | POA: Diagnosis not present

## 2019-07-02 DIAGNOSIS — C711 Malignant neoplasm of frontal lobe: Secondary | ICD-10-CM | POA: Diagnosis not present

## 2019-07-12 ENCOUNTER — Ambulatory Visit (INDEPENDENT_AMBULATORY_CARE_PROVIDER_SITE_OTHER): Payer: Medicare HMO | Admitting: Nurse Practitioner

## 2019-07-12 ENCOUNTER — Encounter: Payer: Self-pay | Admitting: Internal Medicine

## 2019-07-12 ENCOUNTER — Telehealth: Payer: Self-pay | Admitting: *Deleted

## 2019-07-12 ENCOUNTER — Other Ambulatory Visit: Payer: Self-pay

## 2019-07-12 DIAGNOSIS — K219 Gastro-esophageal reflux disease without esophagitis: Secondary | ICD-10-CM

## 2019-07-12 DIAGNOSIS — F331 Major depressive disorder, recurrent, moderate: Secondary | ICD-10-CM | POA: Diagnosis not present

## 2019-07-12 DIAGNOSIS — Z720 Tobacco use: Secondary | ICD-10-CM | POA: Diagnosis not present

## 2019-07-12 DIAGNOSIS — F419 Anxiety disorder, unspecified: Secondary | ICD-10-CM | POA: Diagnosis not present

## 2019-07-12 NOTE — Progress Notes (Signed)
Patient was a no-show 

## 2019-07-12 NOTE — Telephone Encounter (Signed)
Called patient at 8:20am. NO answer-LMTCB. No call back prior to 8:45am

## 2019-07-13 NOTE — Telephone Encounter (Signed)
Patient no showed and letter sent

## 2019-07-18 DIAGNOSIS — C711 Malignant neoplasm of frontal lobe: Secondary | ICD-10-CM | POA: Diagnosis not present

## 2019-07-18 DIAGNOSIS — F411 Generalized anxiety disorder: Secondary | ICD-10-CM | POA: Diagnosis not present

## 2019-07-18 DIAGNOSIS — G894 Chronic pain syndrome: Secondary | ICD-10-CM | POA: Diagnosis not present

## 2019-07-18 DIAGNOSIS — G4700 Insomnia, unspecified: Secondary | ICD-10-CM | POA: Diagnosis not present

## 2019-07-25 ENCOUNTER — Telehealth: Payer: Self-pay | Admitting: *Deleted

## 2019-07-25 NOTE — Telephone Encounter (Signed)
Saul Fordyce, you are scheduled for a virtual visit with your provider today.  Just as we do with appointments in the office, we must obtain your consent to participate.  Your consent will be active for this visit and any virtual visit you may have with one of our providers in the next 365 days.  If you have a MyChart account, I can also send a copy of this consent to you electronically.  All virtual visits are billed to your insurance company just like a traditional visit in the office.  As this is a virtual visit, video technology does not allow for your provider to perform a traditional examination.  This may limit your provider's ability to fully assess your condition.  If your provider identifies any concerns that need to be evaluated in person or the need to arrange testing such as labs, EKG, etc, we will make arrangements to do so.  Although advances in technology are sophisticated, we cannot ensure that it will always work on either your end or our end.  If the connection with a video visit is poor, we may have to switch to a telephone visit.  With either a video or telephone visit, we are not always able to ensure that we have a secure connection.   I need to obtain your verbal consent now.   Are you willing to proceed with your visit today?

## 2019-07-25 NOTE — Telephone Encounter (Signed)
Pt consented to a telephone visit on 07/12/19.

## 2019-08-02 DIAGNOSIS — F331 Major depressive disorder, recurrent, moderate: Secondary | ICD-10-CM | POA: Diagnosis not present

## 2019-08-02 DIAGNOSIS — F39 Unspecified mood [affective] disorder: Secondary | ICD-10-CM | POA: Diagnosis not present

## 2019-08-02 DIAGNOSIS — F17200 Nicotine dependence, unspecified, uncomplicated: Secondary | ICD-10-CM | POA: Diagnosis not present

## 2019-08-02 DIAGNOSIS — J019 Acute sinusitis, unspecified: Secondary | ICD-10-CM | POA: Diagnosis not present

## 2019-08-15 ENCOUNTER — Other Ambulatory Visit (HOSPITAL_COMMUNITY): Payer: Self-pay | Admitting: Oncology

## 2019-08-15 DIAGNOSIS — G4700 Insomnia, unspecified: Secondary | ICD-10-CM | POA: Diagnosis not present

## 2019-08-15 DIAGNOSIS — G894 Chronic pain syndrome: Secondary | ICD-10-CM | POA: Diagnosis not present

## 2019-08-15 DIAGNOSIS — C711 Malignant neoplasm of frontal lobe: Secondary | ICD-10-CM | POA: Diagnosis not present

## 2019-08-15 DIAGNOSIS — R229 Localized swelling, mass and lump, unspecified: Secondary | ICD-10-CM

## 2019-08-15 DIAGNOSIS — R2231 Localized swelling, mass and lump, right upper limb: Secondary | ICD-10-CM | POA: Diagnosis not present

## 2019-08-15 DIAGNOSIS — F411 Generalized anxiety disorder: Secondary | ICD-10-CM | POA: Diagnosis not present

## 2019-08-17 ENCOUNTER — Ambulatory Visit (HOSPITAL_COMMUNITY)
Admission: RE | Admit: 2019-08-17 | Discharge: 2019-08-17 | Disposition: A | Discharge status: Home / Self Care | Payer: Medicare HMO | Source: Ambulatory Visit | Attending: Oncology | Admitting: Oncology

## 2019-08-17 ENCOUNTER — Other Ambulatory Visit (HOSPITAL_COMMUNITY): Payer: Self-pay | Admitting: Internal Medicine

## 2019-08-17 ENCOUNTER — Other Ambulatory Visit: Payer: Self-pay

## 2019-08-17 DIAGNOSIS — D1721 Benign lipomatous neoplasm of skin and subcutaneous tissue of right arm: Secondary | ICD-10-CM | POA: Diagnosis not present

## 2019-08-17 DIAGNOSIS — R229 Localized swelling, mass and lump, unspecified: Secondary | ICD-10-CM

## 2019-08-17 DIAGNOSIS — Z1231 Encounter for screening mammogram for malignant neoplasm of breast: Secondary | ICD-10-CM

## 2019-09-06 ENCOUNTER — Ambulatory Visit: Payer: Medicare HMO | Admitting: Nurse Practitioner

## 2019-09-11 ENCOUNTER — Encounter: Payer: Self-pay | Admitting: Nurse Practitioner

## 2019-09-11 ENCOUNTER — Ambulatory Visit: Payer: Medicare HMO | Admitting: Nurse Practitioner

## 2019-09-11 ENCOUNTER — Other Ambulatory Visit: Payer: Self-pay

## 2019-09-11 VITALS — BP 106/64 | HR 78 | Temp 97.6°F | Ht 64.0 in | Wt 176.8 lb

## 2019-09-11 DIAGNOSIS — K219 Gastro-esophageal reflux disease without esophagitis: Secondary | ICD-10-CM | POA: Diagnosis not present

## 2019-09-11 DIAGNOSIS — R198 Other specified symptoms and signs involving the digestive system and abdomen: Secondary | ICD-10-CM | POA: Diagnosis not present

## 2019-09-11 DIAGNOSIS — K5900 Constipation, unspecified: Secondary | ICD-10-CM | POA: Diagnosis not present

## 2019-09-11 MED ORDER — NALOXEGOL OXALATE 25 MG PO TABS: 25.0000 mg | ORAL_TABLET | Freq: Every day | ORAL | 3 refills | Status: DC

## 2019-09-11 NOTE — Progress Notes (Signed)
Our staff was able to pull her formulary for constipation medications.  Amitiza, Symproic, Trulance were not covered.  Movantik and Trulance are covered a tier 3.  Relistor is covered if you are poor.  I have sent a prescription for Movantik to her pharmacy which would likely be the cheapest option and more geared towards OIC, rather than [employer].

## 2019-09-11 NOTE — Assessment & Plan Note (Signed)
She is struggling with constipation.  Apparently insurance will not cover Amitiza or Linzess.  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  Amitiza previously worked quite well for her.  At this point we will try to do look through her formulary, if we can find one, to identify options.  In the meantime I will send in a prescription for Movantik 25 mg once a day to her pharmacy to try to help her likely OIC.  She will call us if she has problems getting the medication filled.  Follow-up in 2 months.  I feel once we can get her constipation better controlled her abdominal pain will do much better as well.

## 2019-09-11 NOTE — Assessment & Plan Note (Signed)
GERD doing significantly better on pantoprazole.  Recommend she continue her current medications and follow-up in 2 months.

## 2019-09-11 NOTE — Patient Instructions (Signed)
Your health issues we discussed today were:   Abdominal pain constipation: 1. It appears Movantik is covered by her insurance at "tier 3" (which is the best price of any other constipation medicines that they have) 2. I have sent a prescription to your pharmacy for Movantik 25 mg.  Take this once a day 3. Call us if any problems getting Movantik 4. Call us if you have any worsening or severe symptoms  GERD (reflux/heartburn): 1. Glad you are doing much better on pantoprazole (Protonix) 2. Continue taking your current medications 3. Call us if you have any worsening or severe symptoms  Abnormal liver tests previously: 1. Have your labs drawn when you are able to 2. We will help schedule the ultrasound of your abdomen 3. Further recommendations will follow  Overall I recommend:  1. Continue your other current medications 2. Return for follow-up 2 months 3. Call us for any questions or concerns   ---------------------------------------------------------------  I am glad you have gotten your COVID-19 vaccination!  Even though you are fully vaccinated you should continue to follow CDC and state/local guidelines.  ---------------------------------------------------------------   At [facility] we value your feedback. You may receive a survey about your visit today. Please share your experience as we strive to create trusting relationships with our patients to provide genuine, compassionate, quality care.  We appreciate your understanding and patience as we review any laboratory studies, imaging, and other diagnostic tests that are ordered as we care for you. Our office policy is 5 business days for review of these results, and any emergent or urgent results are addressed in a timely manner for your best interest. If you do not hear from our office in 1 week, please contact us.   We also encourage the use of MyChart, which contains your medical information for your review  as well. If you are not enrolled in this feature, an access code is on this after visit summary for your convenience. Thank you for allowing [place] to be involved in your care.  It was great to see you today!  I hope you have a great Summer!!

## 2019-09-11 NOTE — Assessment & Plan Note (Signed)
Abnormal findings on EGD completed in 2020 that were indicative of possible portal hypertensive gastropathy.  Plans for liver evaluation, but she was a no-show to her last 2 appointments.  At this point I will check a CBC, CMP and an abdominal ultrasound for liver changes and for splenomegaly or other varices to indicate cirrhosis and/or portal hypertension.  Further recommendations to follow.  Follow-up 2 months.

## 2019-09-11 NOTE — Progress Notes (Signed)
Referring Provider: Celene Squibb, MD Primary Care Physician:  Celene Squibb, MD Primary GI:  Dr. Gala Romney  Chief Complaint  Patient presents with  . Constipation    can't afford Amitiza    HPI:   Jill Harrell is a 59 y.o. female who presents for follow-up.  The patient was last seen in our office 06/29/2018 for dysphagia, GERD, constipation.  Her last visit was a virtual office visit due to COVID-19/coronavirus pandemic.  Noted history of constipation on chronic pain medications since surgery for glioblastoma in 2009 with recurrence and additional surgery about 18 months later.  Negative Cologuard in 2017, previously declined colonoscopies.  At her last visit noted reflux had gotten worse recently, husband's pantoprazole seem to help.  Having vomiting after meals, requesting ondansetron 4 mg oral pill form rather than sublingual.  Having pill dysphagia symptoms at that time.  Her previous esophageal dilation helped for 2 weeks.  Solid foods have to be chewed really fine due to poor teeth, eat softer foods instead.  Bowel movements regular Amitiza 24 mcg twice daily.  Recommended stop omeprazole start pantoprazole twice daily, Zofran tablet form, continue Amitiza.  Consider EGD if persistent symptoms.  Findings concerning for portal hypertension on EGD in February with no known history of cirrhosis.  Liver appeared okay on noncontrast CT in 2017.  Previous mild elevation of alk phos.  Recommended abdominal ultrasound and blood work for liver evaluation at follow-up in 4 months.  Amitiza PA was approved through covermymeds.[email or web address].  However, after that she changed insurance and Amitiza would cost too much ($222).  Tier reduction was denied by insurance; they were unable to provide a list of medications that would be lower in price.  Patient states husband was on Linzess and this is too expensive as well.  We called the pharmacy and it was revealed that she was in the donut hole which is why her cost is  too much.  Completed patient assistance forms through the manufacturer.  The patient was a no-show for office visit on 01/31/2019.  She was a no-show for her virtual office visit on 07/12/2019.  Today she states she is doing okay overall.  States she cannot afford Amitiza.  Still having constipation. States she never received [employer] forms to complete. Now stating they will not cover Amitiza at all. They also don't cover Linzess. Has [employer] Medicare. Has not tried any other Rx. Has been taking MiraLAX and IBGard, neither works despite tid MiraLAX dosing. Has also tried stool softener, dulcolax. Has a bowel movement every 3-5 days; has hard stools, straining, incomplete emptying. Denies hematochezia. Has lower abdominal pain, improves somewhat after a bowel movement (if she goes enough). Has associated N/V. Denies melena, fever, chills, unintentional weight loss. Zofran helps with her nausea. Is on chronic pain medications for terminal glioblastoma. Denies URI or flu-like symptoms. Denies loss of sense of taste or smell. The patient has received COVID-19 vaccination(s). Denies chest pain, dyspnea, dizziness, lightheadedness, syncope, near syncope. Denies any other upper or lower GI symptoms.  GERD doing well on Protonix, dysphagia significantly improved after dilation but has started having problems with her seizure medications (which are large and cannot be broken into pieces).  Past Medical History:  Diagnosis Date  . Anxiety   . Arthritis    hip and wrist  . Brain cancer ([place])    2009 glioblastoma (stage 4) Dr. Bayard Hugger  . Cancer ([place])   . Degenerative disc disease at L5-S1 level   .  GERD (gastroesophageal reflux disease)   . Seizures ([facility])    brain cancer    Past Surgical History:  Procedure Laterality Date  . ABDOMINAL HYSTERECTOMY    . APPENDECTOMY    . BIOPSY  04/24/2018   Procedure: BIOPSY;  Surgeon: Corbin Ade, MD;  [facility];  Service: Endoscopy;;  gastric  .  BRAIN SURGERY X2    . CHOLECYSTECTOMY    . ESOPHAGOGASTRODUODENOSCOPY (EGD) WITH PROPOFOL N/A 04/24/2018   Dr. Jena Gauss: Normal esophagus.  Status post dilation for history of dysphagia.  Diffuse moderate mucosal changes in the entire stomach consistent with portal hypertensive gastropathy.  Biopsy with chronic inactive gastritis, no H. pylori.  Marland Kitchen MALONEY DILATION N/A 04/24/2018   Procedure: Elease Hashimoto DILATION;  Surgeon: Corbin Ade, MD;  [facility];  Service: Endoscopy;  Laterality: N/A;    Current Outpatient Medications  Medication Sig Dispense Refill  . ALPRAZolam (XANAX) 1 MG tablet Take 1 tablet by mouth every 6 (six) hours as needed.    . budesonide-formoterol (SYMBICORT) 160-4.5 MCG/ACT inhaler Inhale 2 puffs into the lungs 2 (two) times daily. 1 Inhaler 11  . divalproex (DEPAKOTE) 500 MG DR tablet Take 3 tablets (1,500 mg total) by mouth at bedtime. (Patient taking differently: Take 1,000 mg by mouth at bedtime. ) 90 tablet 3  . DULoxetine (CYMBALTA) 60 MG capsule TAKE 1 CAPSULE BY MOUTH ONCE DAILY 90 capsule 3  . estradiol (VIVELLE-DOT) 0.1 MG/24HR patch Place 1 patch (0.1 mg total) onto the skin 2 (two) times a week. 24 patch 3  . HYDROcodone-acetaminophen (NORCO) 10-325 MG tablet Take 1 tablet by mouth every 6 (six) hours as needed (pain). 120 tablet 0  . methadone (DOLOPHINE) 10 MG tablet Take 1 tablet (10 mg total) by mouth every 8 (eight) hours. (Patient taking differently: Take 20 mg by mouth as needed. ) 90 tablet 0  . mirabegron ER (MYRBETRIQ) 50 MG TB24 tablet Take 1 tablet (50 mg total) by mouth daily. 90 tablet 3  . Multiple Vitamin (MULTIVITAMIN) tablet Take 1 tablet by mouth daily.    . Omega-3 Fatty Acids (FISH OIL) 1000 MG CAPS Take by mouth daily.     . ondansetron (ZOFRAN) 4 MG tablet TAKE 1 TABLET(4 MG) BY MOUTH EVERY 8 HOURS AS NEEDED FOR NAUSEA OR VOMITING 30 tablet 2  . pantoprazole (PROTONIX) 40 MG tablet Take 1 tablet (40 mg total) by mouth 2 (two) times  daily before a meal. 60 tablet 3  . Peppermint Oil (IBGARD PO) Take by mouth daily.    . polyethylene glycol (MIRALAX / GLYCOLAX) 17 g packet Take 17 g by mouth 2 (two) times daily.    Marland Kitchen zolpidem (AMBIEN) 10 MG tablet TK 1 T PO QD HS  0   No current facility-administered medications for this visit.    Allergies as of 09/11/2019 - Review Complete 09/11/2019  Allergen Reaction Noted  . Mixed ragweed Other (See Comments) 04/17/2014  . Pollen extract Other (See Comments) 04/17/2014    Family History  Adopted: Yes  Problem Relation Age of Onset  . Heart disease Father   . Hypotension Father   . Diabetes Father   . Allergic rhinitis Father   . Hyperlipidemia Maternal Grandfather   . Hypertension Maternal Grandfather   . Heart disease Maternal Grandfather   . Allergic rhinitis Maternal Grandfather   . Allergic rhinitis Mother   . Allergic rhinitis Maternal Grandmother   . Lymphoma Maternal Grandmother   . Asthma  Brother   . Allergic rhinitis Brother   . Asthma Brother   . Allergic rhinitis Brother   . COPD Brother   . Allergic rhinitis Brother   . Angioedema Neg Hx   . Atopy Neg Hx   . Eczema Neg Hx   . Immunodeficiency Neg Hx   . Urticaria Neg Hx   . Colon cancer Neg Hx     Social History   Socioeconomic History  . Marital status: Married    Spouse name: Not on file  . Number of children: Not on file  . Years of education: Not on file  . Highest education level: Not on file  Occupational History  . Not on file  Tobacco Use  . Smoking status: Current Every Day Smoker    Packs/day: 1.00    Years: 23.00    Pack years: 23.00    Types: Cigarettes  . Smokeless tobacco: Never Used  Vaping Use  . Vaping Use: Never used  Substance and Sexual Activity  . Alcohol use: Yes    Alcohol/week: 0.0 standard drinks    Comment: special occassion  . Drug use: No  . Sexual activity: Yes    Birth control/protection: Other-see comments  Other Topics Concern  . Not on file    Social History Narrative  . Not on file   Social Determinants of Health   Financial Resource Strain:   . Difficulty of Paying Living Expenses:   Food Insecurity:   . Worried About Charity fundraiser in the Last Year:   . Arboriculturist in the Last Year:   Transportation Needs:   . Film/video editor (Medical):   Marland Kitchen Lack of Transportation (Non-Medical):   Physical Activity:   . Days of Exercise per Week:   . Minutes of Exercise per Session:   Stress:   . Feeling of Stress :   Social Connections:   . Frequency of Communication with Friends and Family:   . Frequency of Social Gatherings with Friends and Family:   . Attends Religious Services:   . Active Member of Clubs or Organizations:   . Attends Archivist Meetings:   Marland Kitchen Marital Status:     Subjective: Review of Systems  Constitutional: Negative for chills, fever, malaise/fatigue and weight loss.  HENT: Negative for congestion and sore throat.   Respiratory: Negative for cough and shortness of breath.   Cardiovascular: Negative for chest pain and palpitations.  Gastrointestinal: Negative for abdominal pain, blood in stool, diarrhea, melena, nausea and vomiting.  Musculoskeletal: Negative for joint pain and myalgias.  Skin: Negative for rash.  Neurological: Negative for dizziness and weakness.  Endo/Heme/Allergies: Does not bruise/bleed easily.  Psychiatric/Behavioral: Negative for depression. The patient is not nervous/anxious.   All other systems reviewed and are negative.    Objective: BP 106/64   Pulse 78   Temp 97.6 F (36.4 C) (Oral)   Ht '5\' 4"'$  (1.626 m)   Wt 176 lb 12.8 oz (80.2 kg)   BMI 30.35 kg/m  Physical Exam Vitals and nursing note reviewed.  Constitutional:      General: She is not in acute distress.    Appearance: Normal appearance. She is well-developed. She is not ill-appearing, toxic-appearing or diaphoretic.  HENT:     Head: Normocephalic and atraumatic.     Nose: No  congestion or rhinorrhea.  Eyes:     General: No scleral icterus. Cardiovascular:     Rate and Rhythm: Normal rate and regular rhythm.  Heart sounds: Normal heart sounds.  Pulmonary:     Effort: Pulmonary effort is normal. No respiratory distress.     Breath sounds: Normal breath sounds.  Abdominal:     General: Bowel sounds are normal.     Palpations: Abdomen is soft. There is no hepatomegaly, splenomegaly or mass.     Tenderness: There is no abdominal tenderness. There is no guarding or rebound.     Hernia: No hernia is present.  Skin:    General: Skin is warm and dry.     Coloration: Skin is not jaundiced.     Findings: No rash.  Neurological:     General: No focal deficit present.     Mental Status: She is alert and oriented to person, place, and time.  Psychiatric:        Attention and Perception: Attention normal.        Mood and Affect: Mood normal.        Speech: Speech normal.        Behavior: Behavior normal.        Thought Content: Thought content normal.        Cognition and Memory: Cognition and memory normal.       09/11/2019 4:21 PM   Disclaimer: This note was dictated with voice recognition software. Similar sounding words can inadvertently be transcribed and may not be corrected upon review.

## 2019-09-12 ENCOUNTER — Ambulatory Visit (HOSPITAL_COMMUNITY): Payer: Medicare HMO

## 2019-09-12 NOTE — Progress Notes (Signed)
Cc'ed to pcp °

## 2019-09-14 NOTE — Addendum Note (Signed)
Addended by: Claudina Lick on: 09/14/2019 09:50 AM   Modules accepted: Orders

## 2019-09-19 DIAGNOSIS — G4700 Insomnia, unspecified: Secondary | ICD-10-CM | POA: Diagnosis not present

## 2019-09-19 DIAGNOSIS — R2231 Localized swelling, mass and lump, right upper limb: Secondary | ICD-10-CM | POA: Diagnosis not present

## 2019-09-19 DIAGNOSIS — F411 Generalized anxiety disorder: Secondary | ICD-10-CM | POA: Diagnosis not present

## 2019-09-19 DIAGNOSIS — C711 Malignant neoplasm of frontal lobe: Secondary | ICD-10-CM | POA: Diagnosis not present

## 2019-09-19 DIAGNOSIS — G894 Chronic pain syndrome: Secondary | ICD-10-CM | POA: Diagnosis not present

## 2019-09-27 ENCOUNTER — Other Ambulatory Visit: Payer: Self-pay

## 2019-09-27 ENCOUNTER — Encounter (HOSPITAL_COMMUNITY): Payer: Self-pay

## 2019-09-27 ENCOUNTER — Ambulatory Visit (HOSPITAL_COMMUNITY)
Admission: RE | Admit: 2019-09-27 | Discharge: 2019-09-27 | Disposition: A | Discharge status: Home / Self Care | Payer: Medicare HMO | Source: Ambulatory Visit | Attending: Internal Medicine | Admitting: Internal Medicine

## 2019-09-27 DIAGNOSIS — Z1231 Encounter for screening mammogram for malignant neoplasm of breast: Secondary | ICD-10-CM | POA: Insufficient documentation

## 2019-10-01 ENCOUNTER — Telehealth: Payer: Self-pay | Admitting: *Deleted

## 2019-10-01 DIAGNOSIS — F321 Major depressive disorder, single episode, moderate: Secondary | ICD-10-CM | POA: Diagnosis not present

## 2019-10-01 DIAGNOSIS — D1720 Benign lipomatous neoplasm of skin and subcutaneous tissue of unspecified limb: Secondary | ICD-10-CM | POA: Diagnosis not present

## 2019-10-01 NOTE — Telephone Encounter (Signed)
Patient in workque for [place].  This has been scheduled for 8/6 at 8:30am, arrival 8:15am, npo midnight  Called pt and she states she is at her PCP and will call to get the appt.

## 2019-10-02 ENCOUNTER — Other Ambulatory Visit (HOSPITAL_COMMUNITY): Payer: Self-pay | Admitting: Internal Medicine

## 2019-10-02 DIAGNOSIS — N63 Unspecified lump in unspecified breast: Secondary | ICD-10-CM

## 2019-10-03 NOTE — Telephone Encounter (Signed)
noted 

## 2019-10-03 NOTE — Telephone Encounter (Signed)
Pt called back and I informed her that she is scheduled for [place] at [facility] on 8/6 and needs to arrive at 8:15 am.  She was advised NPO after midnight the day before.  Pt voiced understanding.  Routing as FYI to [employer].

## 2019-10-05 ENCOUNTER — Other Ambulatory Visit: Payer: Self-pay

## 2019-10-05 ENCOUNTER — Ambulatory Visit (HOSPITAL_COMMUNITY)
Admission: RE | Admit: 2019-10-05 | Discharge: 2019-10-05 | Disposition: A | Discharge status: Home / Self Care | Payer: Medicare HMO | Source: Ambulatory Visit | Attending: Nurse Practitioner | Admitting: Nurse Practitioner

## 2019-10-05 DIAGNOSIS — R198 Other specified symptoms and signs involving the digestive system and abdomen: Secondary | ICD-10-CM | POA: Diagnosis not present

## 2019-10-05 DIAGNOSIS — K5900 Constipation, unspecified: Secondary | ICD-10-CM | POA: Diagnosis not present

## 2019-10-05 DIAGNOSIS — K7689 Other specified diseases of liver: Secondary | ICD-10-CM | POA: Diagnosis not present

## 2019-10-05 DIAGNOSIS — K219 Gastro-esophageal reflux disease without esophagitis: Secondary | ICD-10-CM | POA: Diagnosis not present

## 2019-10-05 DIAGNOSIS — N281 Cyst of kidney, acquired: Secondary | ICD-10-CM | POA: Diagnosis not present

## 2019-10-09 ENCOUNTER — Telehealth: Payer: Self-pay | Admitting: Internal Medicine

## 2019-10-09 NOTE — Telephone Encounter (Signed)
Pt was calling to see if her [place] results were available. [phone]

## 2019-10-09 NOTE — Telephone Encounter (Signed)
Left a message for pt. When results are resulted, pt will receive a call from our office with results.

## 2019-10-17 DIAGNOSIS — C711 Malignant neoplasm of frontal lobe: Secondary | ICD-10-CM | POA: Diagnosis not present

## 2019-10-17 DIAGNOSIS — G894 Chronic pain syndrome: Secondary | ICD-10-CM | POA: Diagnosis not present

## 2019-10-17 DIAGNOSIS — G4700 Insomnia, unspecified: Secondary | ICD-10-CM | POA: Diagnosis not present

## 2019-10-17 DIAGNOSIS — F411 Generalized anxiety disorder: Secondary | ICD-10-CM | POA: Diagnosis not present

## 2019-10-25 DIAGNOSIS — Z6829 Body mass index (BMI) 29.0-29.9, adult: Secondary | ICD-10-CM | POA: Diagnosis not present

## 2019-10-25 DIAGNOSIS — J302 Other seasonal allergic rhinitis: Secondary | ICD-10-CM | POA: Diagnosis not present

## 2019-10-25 DIAGNOSIS — Z87898 Personal history of other specified conditions: Secondary | ICD-10-CM | POA: Diagnosis not present

## 2019-11-06 ENCOUNTER — Encounter: Payer: Self-pay | Admitting: Internal Medicine

## 2019-11-09 ENCOUNTER — Other Ambulatory Visit: Payer: Self-pay | Admitting: Family Medicine

## 2019-11-09 DIAGNOSIS — M9902 Segmental and somatic dysfunction of thoracic region: Secondary | ICD-10-CM | POA: Diagnosis not present

## 2019-11-09 DIAGNOSIS — M9903 Segmental and somatic dysfunction of lumbar region: Secondary | ICD-10-CM | POA: Diagnosis not present

## 2019-11-09 DIAGNOSIS — M9905 Segmental and somatic dysfunction of pelvic region: Secondary | ICD-10-CM | POA: Diagnosis not present

## 2019-11-09 DIAGNOSIS — D1721 Benign lipomatous neoplasm of skin and subcutaneous tissue of right arm: Secondary | ICD-10-CM

## 2019-11-09 DIAGNOSIS — M545 Low back pain: Secondary | ICD-10-CM | POA: Diagnosis not present

## 2019-11-09 DIAGNOSIS — M546 Pain in thoracic spine: Secondary | ICD-10-CM | POA: Diagnosis not present

## 2019-11-09 DIAGNOSIS — M9901 Segmental and somatic dysfunction of cervical region: Secondary | ICD-10-CM | POA: Diagnosis not present

## 2019-11-09 DIAGNOSIS — M542 Cervicalgia: Secondary | ICD-10-CM | POA: Diagnosis not present

## 2019-11-14 ENCOUNTER — Ambulatory Visit: Payer: Medicare HMO | Admitting: Nurse Practitioner

## 2019-11-14 DIAGNOSIS — C711 Malignant neoplasm of frontal lobe: Secondary | ICD-10-CM | POA: Diagnosis not present

## 2019-11-14 DIAGNOSIS — F411 Generalized anxiety disorder: Secondary | ICD-10-CM | POA: Diagnosis not present

## 2019-11-14 DIAGNOSIS — G4700 Insomnia, unspecified: Secondary | ICD-10-CM | POA: Diagnosis not present

## 2019-11-14 DIAGNOSIS — G894 Chronic pain syndrome: Secondary | ICD-10-CM | POA: Diagnosis not present

## 2019-11-22 DIAGNOSIS — Z6829 Body mass index (BMI) 29.0-29.9, adult: Secondary | ICD-10-CM | POA: Diagnosis not present

## 2019-11-22 DIAGNOSIS — Z6830 Body mass index (BMI) 30.0-30.9, adult: Secondary | ICD-10-CM | POA: Diagnosis not present

## 2019-11-22 DIAGNOSIS — L301 Dyshidrosis [pompholyx]: Secondary | ICD-10-CM | POA: Diagnosis not present

## 2019-11-22 DIAGNOSIS — Z23 Encounter for immunization: Secondary | ICD-10-CM | POA: Diagnosis not present

## 2019-11-22 DIAGNOSIS — J302 Other seasonal allergic rhinitis: Secondary | ICD-10-CM | POA: Diagnosis not present

## 2019-11-22 DIAGNOSIS — E559 Vitamin D deficiency, unspecified: Secondary | ICD-10-CM | POA: Diagnosis not present

## 2019-11-22 DIAGNOSIS — E782 Mixed hyperlipidemia: Secondary | ICD-10-CM | POA: Diagnosis not present

## 2019-11-22 DIAGNOSIS — Z1389 Encounter for screening for other disorder: Secondary | ICD-10-CM | POA: Diagnosis not present

## 2019-11-22 DIAGNOSIS — Z87898 Personal history of other specified conditions: Secondary | ICD-10-CM | POA: Diagnosis not present

## 2019-11-22 DIAGNOSIS — Z1331 Encounter for screening for depression: Secondary | ICD-10-CM | POA: Diagnosis not present

## 2019-11-29 ENCOUNTER — Ambulatory Visit: Payer: Medicare HMO | Admitting: Orthopaedic Surgery

## 2019-11-29 ENCOUNTER — Other Ambulatory Visit: Payer: Self-pay

## 2019-12-06 ENCOUNTER — Ambulatory Visit: Payer: Medicare HMO | Admitting: General Surgery

## 2019-12-06 DIAGNOSIS — F17200 Nicotine dependence, unspecified, uncomplicated: Secondary | ICD-10-CM | POA: Diagnosis not present

## 2019-12-06 DIAGNOSIS — F32A Depression, unspecified: Secondary | ICD-10-CM | POA: Diagnosis not present

## 2019-12-06 DIAGNOSIS — J302 Other seasonal allergic rhinitis: Secondary | ICD-10-CM | POA: Diagnosis not present

## 2019-12-06 DIAGNOSIS — L301 Dyshidrosis [pompholyx]: Secondary | ICD-10-CM | POA: Diagnosis not present

## 2019-12-06 DIAGNOSIS — Z87898 Personal history of other specified conditions: Secondary | ICD-10-CM | POA: Diagnosis not present

## 2019-12-06 DIAGNOSIS — Z6829 Body mass index (BMI) 29.0-29.9, adult: Secondary | ICD-10-CM | POA: Diagnosis not present

## 2020-01-01 DIAGNOSIS — G894 Chronic pain syndrome: Secondary | ICD-10-CM | POA: Diagnosis not present

## 2020-01-01 DIAGNOSIS — F411 Generalized anxiety disorder: Secondary | ICD-10-CM | POA: Diagnosis not present

## 2020-01-01 DIAGNOSIS — G4700 Insomnia, unspecified: Secondary | ICD-10-CM | POA: Diagnosis not present

## 2020-01-01 DIAGNOSIS — C711 Malignant neoplasm of frontal lobe: Secondary | ICD-10-CM | POA: Diagnosis not present

## 2020-01-02 ENCOUNTER — Ambulatory Visit: Payer: Medicare HMO | Admitting: Nurse Practitioner

## 2020-01-03 DIAGNOSIS — F17200 Nicotine dependence, unspecified, uncomplicated: Secondary | ICD-10-CM | POA: Diagnosis not present

## 2020-01-03 DIAGNOSIS — Z6830 Body mass index (BMI) 30.0-30.9, adult: Secondary | ICD-10-CM | POA: Diagnosis not present

## 2020-01-03 DIAGNOSIS — F32A Depression, unspecified: Secondary | ICD-10-CM | POA: Diagnosis not present

## 2020-01-03 DIAGNOSIS — J302 Other seasonal allergic rhinitis: Secondary | ICD-10-CM | POA: Diagnosis not present

## 2020-01-03 DIAGNOSIS — L301 Dyshidrosis [pompholyx]: Secondary | ICD-10-CM | POA: Diagnosis not present

## 2020-01-03 DIAGNOSIS — Z87898 Personal history of other specified conditions: Secondary | ICD-10-CM | POA: Diagnosis not present

## 2020-01-29 DIAGNOSIS — C711 Malignant neoplasm of frontal lobe: Secondary | ICD-10-CM | POA: Diagnosis not present

## 2020-01-29 DIAGNOSIS — G4700 Insomnia, unspecified: Secondary | ICD-10-CM | POA: Diagnosis not present

## 2020-01-29 DIAGNOSIS — F411 Generalized anxiety disorder: Secondary | ICD-10-CM | POA: Diagnosis not present

## 2020-01-29 DIAGNOSIS — G894 Chronic pain syndrome: Secondary | ICD-10-CM | POA: Diagnosis not present

## 2020-02-11 DIAGNOSIS — F32A Depression, unspecified: Secondary | ICD-10-CM | POA: Diagnosis not present

## 2020-02-11 DIAGNOSIS — M25561 Pain in right knee: Secondary | ICD-10-CM | POA: Diagnosis not present

## 2020-02-11 DIAGNOSIS — L301 Dyshidrosis [pompholyx]: Secondary | ICD-10-CM | POA: Diagnosis not present

## 2020-02-11 DIAGNOSIS — M7061 Trochanteric bursitis, right hip: Secondary | ICD-10-CM | POA: Diagnosis not present

## 2020-02-11 DIAGNOSIS — Z6832 Body mass index (BMI) 32.0-32.9, adult: Secondary | ICD-10-CM | POA: Diagnosis not present

## 2020-02-11 DIAGNOSIS — Z87898 Personal history of other specified conditions: Secondary | ICD-10-CM | POA: Diagnosis not present

## 2020-02-11 DIAGNOSIS — M5430 Sciatica, unspecified side: Secondary | ICD-10-CM | POA: Diagnosis not present

## 2020-02-11 DIAGNOSIS — F17200 Nicotine dependence, unspecified, uncomplicated: Secondary | ICD-10-CM | POA: Diagnosis not present

## 2020-02-18 DIAGNOSIS — M25561 Pain in right knee: Secondary | ICD-10-CM | POA: Diagnosis not present

## 2020-02-18 DIAGNOSIS — M545 Low back pain, unspecified: Secondary | ICD-10-CM | POA: Diagnosis not present

## 2020-02-18 DIAGNOSIS — M4686 Other specified inflammatory spondylopathies, lumbar region: Secondary | ICD-10-CM | POA: Diagnosis not present

## 2020-02-18 DIAGNOSIS — M25551 Pain in right hip: Secondary | ICD-10-CM | POA: Diagnosis not present

## 2020-02-26 DIAGNOSIS — G4700 Insomnia, unspecified: Secondary | ICD-10-CM | POA: Diagnosis not present

## 2020-02-26 DIAGNOSIS — C711 Malignant neoplasm of frontal lobe: Secondary | ICD-10-CM | POA: Diagnosis not present

## 2020-02-26 DIAGNOSIS — F411 Generalized anxiety disorder: Secondary | ICD-10-CM | POA: Diagnosis not present

## 2020-02-26 DIAGNOSIS — G894 Chronic pain syndrome: Secondary | ICD-10-CM | POA: Diagnosis not present

## 2020-03-04 DIAGNOSIS — Z87898 Personal history of other specified conditions: Secondary | ICD-10-CM | POA: Diagnosis not present

## 2020-03-04 DIAGNOSIS — M5430 Sciatica, unspecified side: Secondary | ICD-10-CM | POA: Diagnosis not present

## 2020-03-04 DIAGNOSIS — F17200 Nicotine dependence, unspecified, uncomplicated: Secondary | ICD-10-CM | POA: Diagnosis not present

## 2020-03-04 DIAGNOSIS — Z6832 Body mass index (BMI) 32.0-32.9, adult: Secondary | ICD-10-CM | POA: Diagnosis not present

## 2020-03-04 DIAGNOSIS — L301 Dyshidrosis [pompholyx]: Secondary | ICD-10-CM | POA: Diagnosis not present

## 2020-03-04 DIAGNOSIS — Z23 Encounter for immunization: Secondary | ICD-10-CM | POA: Diagnosis not present

## 2020-03-04 DIAGNOSIS — M7061 Trochanteric bursitis, right hip: Secondary | ICD-10-CM | POA: Diagnosis not present

## 2020-03-04 DIAGNOSIS — F32A Depression, unspecified: Secondary | ICD-10-CM | POA: Diagnosis not present

## 2020-03-04 DIAGNOSIS — Z6833 Body mass index (BMI) 33.0-33.9, adult: Secondary | ICD-10-CM | POA: Diagnosis not present

## 2020-03-04 DIAGNOSIS — M25561 Pain in right knee: Secondary | ICD-10-CM | POA: Diagnosis not present

## 2020-03-04 DIAGNOSIS — J302 Other seasonal allergic rhinitis: Secondary | ICD-10-CM | POA: Diagnosis not present

## 2020-03-11 ENCOUNTER — Encounter (HOSPITAL_COMMUNITY): Payer: Self-pay

## 2020-03-11 ENCOUNTER — Ambulatory Visit (HOSPITAL_COMMUNITY): Admission: RE | Admit: 2020-03-11 | Payer: Medicare HMO | Source: Ambulatory Visit

## 2020-03-11 ENCOUNTER — Ambulatory Visit (HOSPITAL_COMMUNITY): Payer: Medicare HMO

## 2020-03-14 ENCOUNTER — Ambulatory Visit: Payer: Medicare HMO | Admitting: Internal Medicine

## 2020-03-24 DIAGNOSIS — M9905 Segmental and somatic dysfunction of pelvic region: Secondary | ICD-10-CM | POA: Diagnosis not present

## 2020-03-24 DIAGNOSIS — M5441 Lumbago with sciatica, right side: Secondary | ICD-10-CM | POA: Diagnosis not present

## 2020-03-24 DIAGNOSIS — M9902 Segmental and somatic dysfunction of thoracic region: Secondary | ICD-10-CM | POA: Diagnosis not present

## 2020-03-24 DIAGNOSIS — M9903 Segmental and somatic dysfunction of lumbar region: Secondary | ICD-10-CM | POA: Diagnosis not present

## 2020-03-24 DIAGNOSIS — M546 Pain in thoracic spine: Secondary | ICD-10-CM | POA: Diagnosis not present

## 2020-03-25 DIAGNOSIS — G4700 Insomnia, unspecified: Secondary | ICD-10-CM | POA: Diagnosis not present

## 2020-03-25 DIAGNOSIS — R197 Diarrhea, unspecified: Secondary | ICD-10-CM | POA: Diagnosis not present

## 2020-03-25 DIAGNOSIS — C711 Malignant neoplasm of frontal lobe: Secondary | ICD-10-CM | POA: Diagnosis not present

## 2020-03-25 DIAGNOSIS — G894 Chronic pain syndrome: Secondary | ICD-10-CM | POA: Diagnosis not present

## 2020-03-25 DIAGNOSIS — F411 Generalized anxiety disorder: Secondary | ICD-10-CM | POA: Diagnosis not present

## 2020-04-07 DIAGNOSIS — K047 Periapical abscess without sinus: Secondary | ICD-10-CM | POA: Diagnosis not present

## 2020-04-07 DIAGNOSIS — R220 Localized swelling, mass and lump, head: Secondary | ICD-10-CM | POA: Diagnosis not present

## 2020-04-09 ENCOUNTER — Other Ambulatory Visit: Payer: Self-pay | Admitting: Obstetrics & Gynecology

## 2020-04-09 ENCOUNTER — Other Ambulatory Visit: Payer: Self-pay

## 2020-04-09 ENCOUNTER — Telehealth: Payer: Self-pay | Admitting: Obstetrics & Gynecology

## 2020-04-09 MED ORDER — FLUCONAZOLE 150 MG PO TABS
ORAL_TABLET | ORAL | 1 refills | Status: DC
Start: 2020-04-09 — End: 2020-07-10

## 2020-04-09 NOTE — Telephone Encounter (Signed)
Diflucan e prescribed as requested 

## 2020-04-09 NOTE — Telephone Encounter (Signed)
Patient stated that she's taking Amoxicillin for an infection in her mouth from the Dentist and states that she usually gets a yeast infection from the antibiotics. Patient wants Dr. Elonda Husky to call in fluconazole (DIFLUCAN) 150 MG tablet prescription to [facility]. Clinical staff will follow up with patient.

## 2020-04-11 ENCOUNTER — Telehealth: Payer: Self-pay | Admitting: Obstetrics & Gynecology

## 2020-04-11 NOTE — Telephone Encounter (Signed)
Returned pt's call for clarification. No answer, left vm msg

## 2020-04-11 NOTE — Telephone Encounter (Signed)
Patient called stating that she had a diagnostic mammo scheduled but they cancelled it due to her changing primary care. P{atient was told that she needs to contact us and get an order for this placed. Please contact pt when done.

## 2020-04-22 DIAGNOSIS — G894 Chronic pain syndrome: Secondary | ICD-10-CM | POA: Diagnosis not present

## 2020-04-22 DIAGNOSIS — F411 Generalized anxiety disorder: Secondary | ICD-10-CM | POA: Diagnosis not present

## 2020-04-22 DIAGNOSIS — C711 Malignant neoplasm of frontal lobe: Secondary | ICD-10-CM | POA: Diagnosis not present

## 2020-04-22 DIAGNOSIS — G4700 Insomnia, unspecified: Secondary | ICD-10-CM | POA: Diagnosis not present

## 2020-04-29 ENCOUNTER — Ambulatory Visit: Payer: Medicare HMO | Admitting: Internal Medicine

## 2020-04-29 ENCOUNTER — Encounter: Payer: Self-pay | Admitting: Internal Medicine

## 2020-05-01 DIAGNOSIS — M7061 Trochanteric bursitis, right hip: Secondary | ICD-10-CM | POA: Diagnosis not present

## 2020-05-01 DIAGNOSIS — F332 Major depressive disorder, recurrent severe without psychotic features: Secondary | ICD-10-CM | POA: Diagnosis not present

## 2020-05-01 DIAGNOSIS — F17200 Nicotine dependence, unspecified, uncomplicated: Secondary | ICD-10-CM | POA: Diagnosis not present

## 2020-05-01 DIAGNOSIS — Z87898 Personal history of other specified conditions: Secondary | ICD-10-CM | POA: Diagnosis not present

## 2020-05-01 DIAGNOSIS — Z6832 Body mass index (BMI) 32.0-32.9, adult: Secondary | ICD-10-CM | POA: Diagnosis not present

## 2020-05-01 DIAGNOSIS — M25561 Pain in right knee: Secondary | ICD-10-CM | POA: Diagnosis not present

## 2020-05-01 DIAGNOSIS — J302 Other seasonal allergic rhinitis: Secondary | ICD-10-CM | POA: Diagnosis not present

## 2020-05-01 DIAGNOSIS — M5430 Sciatica, unspecified side: Secondary | ICD-10-CM | POA: Diagnosis not present

## 2020-05-13 DIAGNOSIS — K0889 Other specified disorders of teeth and supporting structures: Secondary | ICD-10-CM | POA: Diagnosis not present

## 2020-05-13 DIAGNOSIS — Z6832 Body mass index (BMI) 32.0-32.9, adult: Secondary | ICD-10-CM | POA: Diagnosis not present

## 2020-05-20 ENCOUNTER — Other Ambulatory Visit: Payer: Medicare HMO | Admitting: Obstetrics & Gynecology

## 2020-05-20 DIAGNOSIS — M069 Rheumatoid arthritis, unspecified: Secondary | ICD-10-CM | POA: Diagnosis not present

## 2020-05-20 DIAGNOSIS — C711 Malignant neoplasm of frontal lobe: Secondary | ICD-10-CM | POA: Diagnosis not present

## 2020-05-20 DIAGNOSIS — F411 Generalized anxiety disorder: Secondary | ICD-10-CM | POA: Diagnosis not present

## 2020-05-20 DIAGNOSIS — G4700 Insomnia, unspecified: Secondary | ICD-10-CM | POA: Diagnosis not present

## 2020-05-20 DIAGNOSIS — G894 Chronic pain syndrome: Secondary | ICD-10-CM | POA: Diagnosis not present

## 2020-05-27 ENCOUNTER — Ambulatory Visit (HOSPITAL_COMMUNITY): Payer: Medicare HMO

## 2020-05-27 ENCOUNTER — Ambulatory Visit (HOSPITAL_COMMUNITY): Admission: RE | Admit: 2020-05-27 | Payer: Medicare HMO | Source: Ambulatory Visit

## 2020-05-27 ENCOUNTER — Encounter (HOSPITAL_COMMUNITY): Payer: Medicare HMO

## 2020-06-05 ENCOUNTER — Other Ambulatory Visit: Payer: Medicare HMO | Admitting: Obstetrics & Gynecology

## 2020-06-17 DIAGNOSIS — G4700 Insomnia, unspecified: Secondary | ICD-10-CM | POA: Diagnosis not present

## 2020-06-17 DIAGNOSIS — G894 Chronic pain syndrome: Secondary | ICD-10-CM | POA: Diagnosis not present

## 2020-06-17 DIAGNOSIS — C711 Malignant neoplasm of frontal lobe: Secondary | ICD-10-CM | POA: Diagnosis not present

## 2020-06-17 DIAGNOSIS — F411 Generalized anxiety disorder: Secondary | ICD-10-CM | POA: Diagnosis not present

## 2020-06-24 ENCOUNTER — Ambulatory Visit (HOSPITAL_COMMUNITY): Payer: Medicare HMO

## 2020-06-24 ENCOUNTER — Encounter (HOSPITAL_COMMUNITY): Payer: Medicare HMO

## 2020-06-30 ENCOUNTER — Other Ambulatory Visit: Payer: Medicare HMO | Admitting: Obstetrics & Gynecology

## 2020-07-03 ENCOUNTER — Other Ambulatory Visit (HOSPITAL_COMMUNITY): Payer: Self-pay | Admitting: Internal Medicine

## 2020-07-03 DIAGNOSIS — M810 Age-related osteoporosis without current pathological fracture: Secondary | ICD-10-CM

## 2020-07-09 ENCOUNTER — Ambulatory Visit (HOSPITAL_COMMUNITY)
Admission: RE | Admit: 2020-07-09 | Discharge: 2020-07-09 | Disposition: A | Discharge status: Home / Self Care | Payer: Medicare HMO | Source: Ambulatory Visit | Attending: Internal Medicine | Admitting: Internal Medicine

## 2020-07-09 ENCOUNTER — Other Ambulatory Visit: Payer: Self-pay

## 2020-07-09 DIAGNOSIS — M810 Age-related osteoporosis without current pathological fracture: Secondary | ICD-10-CM | POA: Insufficient documentation

## 2020-07-09 DIAGNOSIS — Z780 Asymptomatic menopausal state: Secondary | ICD-10-CM | POA: Diagnosis not present

## 2020-07-09 DIAGNOSIS — M8588 Other specified disorders of bone density and structure, other site: Secondary | ICD-10-CM | POA: Diagnosis not present

## 2020-07-10 ENCOUNTER — Encounter: Payer: Self-pay | Admitting: Obstetrics & Gynecology

## 2020-07-10 ENCOUNTER — Ambulatory Visit (INDEPENDENT_AMBULATORY_CARE_PROVIDER_SITE_OTHER): Payer: Medicare HMO | Admitting: Obstetrics & Gynecology

## 2020-07-10 VITALS — BP 108/59 | HR 63 | Ht 64.0 in | Wt 191.0 lb

## 2020-07-10 DIAGNOSIS — Z1211 Encounter for screening for malignant neoplasm of colon: Secondary | ICD-10-CM

## 2020-07-10 DIAGNOSIS — Z1231 Encounter for screening mammogram for malignant neoplasm of breast: Secondary | ICD-10-CM

## 2020-07-10 DIAGNOSIS — Z01419 Encounter for gynecological examination (general) (routine) without abnormal findings: Secondary | ICD-10-CM

## 2020-07-10 DIAGNOSIS — Z1212 Encounter for screening for malignant neoplasm of rectum: Secondary | ICD-10-CM

## 2020-07-10 LAB — HEMOCCULT GUIAC POC 1CARD (OFFICE): Fecal Occult Blood, POC: NEGATIVE

## 2020-07-10 NOTE — Progress Notes (Signed)
Subjective:     Jill Harrell is a 60 y.o. female here for a routine exam.  No LMP recorded. Patient has had a hysterectomy. [id number] Birth Control Method:  hysterectomy Menstrual Calendar(currently): amenorrheic  Current complaints: none.   Current acute medical issues:  none   Recent Gynecologic History No LMP recorded. Patient has had a hysterectomy. Last Pap: 20,  n/a Last mammogram: 2021,  normal  Past Medical History:  Diagnosis Date  . Anxiety   . Arthritis    hip and wrist  . Brain cancer ([place])    2009 glioblastoma (stage 4) Dr. Bayard Hugger  . Cancer ([place])   . Degenerative disc disease at L5-S1 level   . GERD (gastroesophageal reflux disease)   . Seizures ([place])    brain cancer    Past Surgical History:  Procedure Laterality Date  . ABDOMINAL HYSTERECTOMY    . APPENDECTOMY    . BIOPSY  04/24/2018   Procedure: BIOPSY;  Surgeon: Daneil Dolin, MD;  [facility];  Service: Endoscopy;;  gastric  . BRAIN SURGERY X2    . CHOLECYSTECTOMY    . ESOPHAGOGASTRODUODENOSCOPY (EGD) WITH PROPOFOL N/A 04/24/2018   Dr. Gala Romney: Normal esophagus.  Status post dilation for history of dysphagia.  Diffuse moderate mucosal changes in the entire stomach consistent with portal hypertensive gastropathy.  Biopsy with chronic inactive gastritis, no H. pylori.  Marland Kitchen MALONEY DILATION N/A 04/24/2018   Procedure: Venia Minks DILATION;  Surgeon: Daneil Dolin, MD;  [facility];  Service: Endoscopy;  Laterality: N/A;    OB History    Gravida  4   Para  0   Term      Preterm      AB  4   Living  0     SAB  4   IAB      Ectopic      Multiple      Live Births              Social History   Socioeconomic History  . Marital status: Married    Spouse name: Not on file  . Number of children: Not on file  . Years of education: Not on file  . Highest education level: Not on file  Occupational History  . Not on file  Tobacco Use  . Smoking status:  Current Every Day Smoker    Packs/day: 1.00    Years: 23.00    Pack years: 23.00    Types: Cigarettes  . Smokeless tobacco: Never Used  Vaping Use  . Vaping Use: Never used  Substance and Sexual Activity  . Alcohol use: Yes    Alcohol/week: 0.0 standard drinks    Comment: special occassion  . Drug use: No  . Sexual activity: Yes    Birth control/protection: Surgical    Comment: hyst  Other Topics Concern  . Not on file  Social History Narrative  . Not on file   Social Determinants of Health   Financial Resource Strain: High Risk  . Difficulty of Paying Living Expenses: Hard  Food Insecurity: Food Insecurity Present  . Worried About Charity fundraiser in the Last Year: Often true  . Ran Out of Food in the Last Year: Often true  Transportation Needs: Unmet Transportation Needs  . Lack of Transportation (Medical): Yes  . Lack of Transportation (Non-Medical): Yes  Physical Activity: Inactive  . Days of Exercise per Week: 0 days  . Minutes of Exercise per  Session: 0 min  Stress: Stress Concern Present  . Feeling of Stress : Rather much  Social Connections: Socially Integrated  . Frequency of Communication with Friends and Family: More than three times a week  . Frequency of Social Gatherings with Friends and Family: Twice a week  . Attends Religious Services: More than 4 times per year  . Active Member of Clubs or Organizations: Yes  . Attends Archivist Meetings: More than 4 times per year  . Marital Status: Married    Family History  Adopted: Yes  Problem Relation Age of Onset  . Heart disease Father   . Hypotension Father   . Diabetes Father   . Allergic rhinitis Father   . Hyperlipidemia Maternal Grandfather   . Hypertension Maternal Grandfather   . Heart disease Maternal Grandfather   . Allergic rhinitis Maternal Grandfather   . Allergic rhinitis Mother   . Allergic rhinitis Maternal Grandmother   . Lymphoma Maternal Grandmother   . Asthma Brother    . Allergic rhinitis Brother   . Asthma Brother   . Allergic rhinitis Brother   . COPD Brother   . Allergic rhinitis Brother   . Angioedema Neg Hx   . Atopy Neg Hx   . Eczema Neg Hx   . Immunodeficiency Neg Hx   . Urticaria Neg Hx   . Colon cancer Neg Hx      Current Outpatient Medications:  .  ALPRAZolam (XANAX) 1 MG tablet, Take 1 tablet by mouth every 6 (six) hours as needed., Disp: , Rfl:  .  Aspirin-Acetaminophen-Caffeine (EXCEDRIN MIGRAINE PO), Take by mouth., Disp: , Rfl:  .  budesonide-formoterol (SYMBICORT) 160-4.5 MCG/ACT inhaler, Inhale 2 puffs into the lungs 2 (two) times daily., Disp: 1 Inhaler, Rfl: 11 .  divalproex (DEPAKOTE) 500 MG DR tablet, Take 3 tablets (1,500 mg total) by mouth at bedtime. (Patient taking differently: Take 1,000 mg by mouth at bedtime.), Disp: 90 tablet, Rfl: 3 .  estradiol (VIVELLE-DOT) 0.1 MG/24HR patch, Place 1 patch (0.1 mg total) onto the skin 2 (two) times a week., Disp: 24 patch, Rfl: 3 .  HYDROcodone-acetaminophen (NORCO) 10-325 MG tablet, Take 1 tablet by mouth every 6 (six) hours as needed (pain)., Disp: 120 tablet, Rfl: 0 .  methadone (DOLOPHINE) 10 MG tablet, Take 1 tablet (10 mg total) by mouth every 8 (eight) hours. (Patient taking differently: Take 20 mg by mouth as needed.), Disp: 90 tablet, Rfl: 0 .  mirabegron ER (MYRBETRIQ) 50 MG TB24 tablet, Take 1 tablet (50 mg total) by mouth daily., Disp: 90 tablet, Rfl: 3 .  Multiple Vitamin (MULTIVITAMIN) tablet, Take 1 tablet by mouth daily., Disp: , Rfl:  .  Omega-3 Fatty Acids (FISH OIL) 1000 MG CAPS, Take by mouth daily. , Disp: , Rfl:  .  pantoprazole (PROTONIX) 40 MG tablet, Take 1 tablet (40 mg total) by mouth 2 (two) times daily before a meal., Disp: 60 tablet, Rfl: 3 .  polyethylene glycol (MIRALAX / GLYCOLAX) 17 g packet, Take 17 g by mouth., Disp: , Rfl:  .  Promethazine HCl (PHENERGAN PO), Take by mouth., Disp: , Rfl:  .  sertraline (ZOLOFT) 50 MG tablet, Take 50 mg by mouth  daily., Disp: , Rfl:  .  zolpidem (AMBIEN) 10 MG tablet, TK 1 T PO QD HS, Disp: , Rfl: 0 .  naloxegol oxalate (MOVANTIK) 25 MG TABS tablet, Take 1 tablet (25 mg total) by mouth daily., Disp: 90 tablet, Rfl: 3  Review of Systems  Review of Systems  Constitutional: Negative for fever, chills, weight loss, malaise/fatigue and diaphoresis.  HENT: Negative for hearing loss, ear pain, nosebleeds, congestion, sore throat, neck pain, tinnitus and ear discharge.   Eyes: Negative for blurred vision, double vision, photophobia, pain, discharge and redness.  Respiratory: Negative for cough, hemoptysis, sputum production, shortness of breath, wheezing and stridor.   Cardiovascular: Negative for chest pain, palpitations, orthopnea, claudication, leg swelling and PND.  Gastrointestinal: negative for abdominal pain. Negative for heartburn, nausea, vomiting, diarrhea, constipation, blood in stool and melena.  Genitourinary: Negative for dysuria, urgency, frequency, hematuria and flank pain.  Musculoskeletal: Negative for myalgias, back pain, joint pain and falls.  Skin: Negative for itching and rash.  Neurological: Negative for dizziness, tingling, tremors, sensory change, speech change, focal weakness, seizures, loss of consciousness, weakness and headaches.  Endo/Heme/Allergies: Negative for environmental allergies and polydipsia. Does not bruise/bleed easily.  Psychiatric/Behavioral: Negative for depression, suicidal ideas, hallucinations, memory loss and substance abuse. The patient is not nervous/anxious and does not have insomnia.        Objective:  Blood pressure (!) 108/59, pulse 63, height 5\' 4"  (1.626 m), weight 191 lb (86.6 kg).   Physical Exam  Vitals reviewed. Constitutional: She is oriented to person, place, and time. She appears well-developed and well-nourished.  HENT:  Head: Normocephalic and atraumatic.        Right Ear: External ear normal.  Left Ear: External ear normal.  Nose: Nose  normal.  Mouth/Throat: Oropharynx is clear and moist.  Eyes: Conjunctivae and EOM are normal. Pupils are equal, round, and reactive to light. Right eye exhibits no discharge. Left eye exhibits no discharge. No scleral icterus.  Neck: Normal range of motion. Neck supple. No tracheal deviation present. No thyromegaly present.  Cardiovascular: Normal rate, regular rhythm, normal heart sounds and intact distal pulses.  Exam reveals no gallop and no friction rub.   No murmur heard. Respiratory: Effort normal and breath sounds normal. No respiratory distress. She has no wheezes. She has no rales. She exhibits no tenderness.  GI: Soft. Bowel sounds are normal. She exhibits no distension and no mass. There is no tenderness. There is no rebound and no guarding.  Genitourinary:  Breasts no masses skin changes or nipple changes bilaterally      Vulva is normal without lesions Vagina is pink moist without discharge Cervix absent Uterus is absent Adnexa is negative {Rectal    hemoccult negative, normal tone, no masses  Musculoskeletal: Normal range of motion. She exhibits no edema and no tenderness.  Neurological: She is alert and oriented to person, place, and time. She has normal reflexes. She displays normal reflexes. No cranial nerve deficit. She exhibits normal muscle tone. Coordination normal.  Skin: Skin is warm and dry. No rash noted. No erythema. No pallor.  Psychiatric: She has a normal mood and affect. Her behavior is normal. Judgment and thought content normal.       Medications Ordered at today's visit: No orders of the defined types were placed in this encounter.   Other orders placed at today's visit: Orders Placed This Encounter  Procedures  . MM 3D SCREEN BREAST BILATERAL  . POCT occult blood stool      Assessment:    Normal Gyn exam.    Plan:    Mammogram ordered. Follow up in: 1 year.     Return in about 1 year (around 07/10/2021) for yearly, with Dr Elonda Husky.

## 2020-07-14 DIAGNOSIS — M7061 Trochanteric bursitis, right hip: Secondary | ICD-10-CM | POA: Diagnosis not present

## 2020-07-14 DIAGNOSIS — Z13220 Encounter for screening for lipoid disorders: Secondary | ICD-10-CM | POA: Diagnosis not present

## 2020-07-14 DIAGNOSIS — F17200 Nicotine dependence, unspecified, uncomplicated: Secondary | ICD-10-CM | POA: Diagnosis not present

## 2020-07-14 DIAGNOSIS — Z87898 Personal history of other specified conditions: Secondary | ICD-10-CM | POA: Diagnosis not present

## 2020-07-14 DIAGNOSIS — J302 Other seasonal allergic rhinitis: Secondary | ICD-10-CM | POA: Diagnosis not present

## 2020-07-14 DIAGNOSIS — Z6832 Body mass index (BMI) 32.0-32.9, adult: Secondary | ICD-10-CM | POA: Diagnosis not present

## 2020-07-14 DIAGNOSIS — E785 Hyperlipidemia, unspecified: Secondary | ICD-10-CM | POA: Diagnosis not present

## 2020-07-14 DIAGNOSIS — F332 Major depressive disorder, recurrent severe without psychotic features: Secondary | ICD-10-CM | POA: Diagnosis not present

## 2020-07-14 DIAGNOSIS — M5430 Sciatica, unspecified side: Secondary | ICD-10-CM | POA: Diagnosis not present

## 2020-07-14 DIAGNOSIS — M25561 Pain in right knee: Secondary | ICD-10-CM | POA: Diagnosis not present

## 2020-07-15 DIAGNOSIS — G4700 Insomnia, unspecified: Secondary | ICD-10-CM | POA: Diagnosis not present

## 2020-07-15 DIAGNOSIS — C711 Malignant neoplasm of frontal lobe: Secondary | ICD-10-CM | POA: Diagnosis not present

## 2020-07-15 DIAGNOSIS — G894 Chronic pain syndrome: Secondary | ICD-10-CM | POA: Diagnosis not present

## 2020-07-15 DIAGNOSIS — F411 Generalized anxiety disorder: Secondary | ICD-10-CM | POA: Diagnosis not present

## 2020-07-30 DIAGNOSIS — R109 Unspecified abdominal pain: Secondary | ICD-10-CM | POA: Diagnosis not present

## 2020-07-30 DIAGNOSIS — R1013 Epigastric pain: Secondary | ICD-10-CM | POA: Diagnosis not present

## 2020-07-30 DIAGNOSIS — Z6831 Body mass index (BMI) 31.0-31.9, adult: Secondary | ICD-10-CM | POA: Diagnosis not present

## 2020-08-06 DIAGNOSIS — M6281 Muscle weakness (generalized): Secondary | ICD-10-CM | POA: Diagnosis not present

## 2020-08-06 DIAGNOSIS — M79604 Pain in right leg: Secondary | ICD-10-CM | POA: Diagnosis not present

## 2020-08-06 DIAGNOSIS — Z9181 History of falling: Secondary | ICD-10-CM | POA: Diagnosis not present

## 2020-08-06 DIAGNOSIS — R203 Hyperesthesia: Secondary | ICD-10-CM | POA: Diagnosis not present

## 2020-08-06 DIAGNOSIS — M545 Low back pain, unspecified: Secondary | ICD-10-CM | POA: Diagnosis not present

## 2020-08-06 DIAGNOSIS — M799 Soft tissue disorder, unspecified: Secondary | ICD-10-CM | POA: Diagnosis not present

## 2020-08-06 DIAGNOSIS — M2569 Stiffness of other specified joint, not elsewhere classified: Secondary | ICD-10-CM | POA: Diagnosis not present

## 2020-08-13 DIAGNOSIS — M549 Dorsalgia, unspecified: Secondary | ICD-10-CM | POA: Diagnosis not present

## 2020-08-13 DIAGNOSIS — M810 Age-related osteoporosis without current pathological fracture: Secondary | ICD-10-CM | POA: Diagnosis not present

## 2020-08-13 DIAGNOSIS — Z6831 Body mass index (BMI) 31.0-31.9, adult: Secondary | ICD-10-CM | POA: Diagnosis not present

## 2020-08-13 DIAGNOSIS — R109 Unspecified abdominal pain: Secondary | ICD-10-CM | POA: Diagnosis not present

## 2020-08-19 DIAGNOSIS — C711 Malignant neoplasm of frontal lobe: Secondary | ICD-10-CM | POA: Diagnosis not present

## 2020-08-19 DIAGNOSIS — F411 Generalized anxiety disorder: Secondary | ICD-10-CM | POA: Diagnosis not present

## 2020-08-19 DIAGNOSIS — G4700 Insomnia, unspecified: Secondary | ICD-10-CM | POA: Diagnosis not present

## 2020-08-19 DIAGNOSIS — G894 Chronic pain syndrome: Secondary | ICD-10-CM | POA: Diagnosis not present

## 2020-09-08 ENCOUNTER — Ambulatory Visit (HOSPITAL_COMMUNITY)
Admission: RE | Admit: 2020-09-08 | Discharge: 2020-09-08 | Disposition: A | Discharge status: Home / Self Care | Payer: Medicare HMO | Source: Ambulatory Visit | Attending: Obstetrics & Gynecology | Admitting: Obstetrics & Gynecology

## 2020-09-08 ENCOUNTER — Other Ambulatory Visit: Payer: Self-pay

## 2020-09-08 DIAGNOSIS — Z1231 Encounter for screening mammogram for malignant neoplasm of breast: Secondary | ICD-10-CM

## 2020-09-15 DIAGNOSIS — F17210 Nicotine dependence, cigarettes, uncomplicated: Secondary | ICD-10-CM | POA: Diagnosis not present

## 2020-09-15 DIAGNOSIS — C711 Malignant neoplasm of frontal lobe: Secondary | ICD-10-CM | POA: Diagnosis not present

## 2020-09-15 DIAGNOSIS — Z9889 Other specified postprocedural states: Secondary | ICD-10-CM | POA: Diagnosis not present

## 2020-09-18 DIAGNOSIS — G4700 Insomnia, unspecified: Secondary | ICD-10-CM | POA: Diagnosis not present

## 2020-09-18 DIAGNOSIS — G894 Chronic pain syndrome: Secondary | ICD-10-CM | POA: Diagnosis not present

## 2020-09-18 DIAGNOSIS — F411 Generalized anxiety disorder: Secondary | ICD-10-CM | POA: Diagnosis not present

## 2020-09-18 DIAGNOSIS — C711 Malignant neoplasm of frontal lobe: Secondary | ICD-10-CM | POA: Diagnosis not present

## 2020-10-09 DIAGNOSIS — J029 Acute pharyngitis, unspecified: Secondary | ICD-10-CM | POA: Diagnosis not present

## 2020-10-09 DIAGNOSIS — J069 Acute upper respiratory infection, unspecified: Secondary | ICD-10-CM | POA: Diagnosis not present

## 2020-10-20 DIAGNOSIS — M069 Rheumatoid arthritis, unspecified: Secondary | ICD-10-CM | POA: Diagnosis not present

## 2020-10-20 DIAGNOSIS — C711 Malignant neoplasm of frontal lobe: Secondary | ICD-10-CM | POA: Diagnosis not present

## 2020-10-20 DIAGNOSIS — R569 Unspecified convulsions: Secondary | ICD-10-CM | POA: Diagnosis not present

## 2020-10-20 DIAGNOSIS — F411 Generalized anxiety disorder: Secondary | ICD-10-CM | POA: Diagnosis not present

## 2020-10-20 DIAGNOSIS — G894 Chronic pain syndrome: Secondary | ICD-10-CM | POA: Diagnosis not present

## 2020-10-20 DIAGNOSIS — G4700 Insomnia, unspecified: Secondary | ICD-10-CM | POA: Diagnosis not present

## 2020-10-28 DIAGNOSIS — D179 Benign lipomatous neoplasm, unspecified: Secondary | ICD-10-CM | POA: Diagnosis not present

## 2020-10-28 DIAGNOSIS — Z6831 Body mass index (BMI) 31.0-31.9, adult: Secondary | ICD-10-CM | POA: Diagnosis not present

## 2020-11-17 DIAGNOSIS — F411 Generalized anxiety disorder: Secondary | ICD-10-CM | POA: Diagnosis not present

## 2020-11-17 DIAGNOSIS — G894 Chronic pain syndrome: Secondary | ICD-10-CM | POA: Diagnosis not present

## 2020-11-17 DIAGNOSIS — G4700 Insomnia, unspecified: Secondary | ICD-10-CM | POA: Diagnosis not present

## 2020-11-17 DIAGNOSIS — C711 Malignant neoplasm of frontal lobe: Secondary | ICD-10-CM | POA: Diagnosis not present

## 2020-11-25 DIAGNOSIS — J329 Chronic sinusitis, unspecified: Secondary | ICD-10-CM | POA: Diagnosis not present

## 2020-11-25 DIAGNOSIS — Z23 Encounter for immunization: Secondary | ICD-10-CM | POA: Diagnosis not present

## 2020-11-25 DIAGNOSIS — D179 Benign lipomatous neoplasm, unspecified: Secondary | ICD-10-CM | POA: Diagnosis not present

## 2020-11-25 DIAGNOSIS — M810 Age-related osteoporosis without current pathological fracture: Secondary | ICD-10-CM | POA: Diagnosis not present

## 2020-11-25 DIAGNOSIS — M549 Dorsalgia, unspecified: Secondary | ICD-10-CM | POA: Diagnosis not present

## 2020-11-25 DIAGNOSIS — Z6831 Body mass index (BMI) 31.0-31.9, adult: Secondary | ICD-10-CM | POA: Diagnosis not present

## 2020-11-25 DIAGNOSIS — R109 Unspecified abdominal pain: Secondary | ICD-10-CM | POA: Diagnosis not present

## 2020-11-26 ENCOUNTER — Other Ambulatory Visit: Payer: Self-pay | Admitting: Obstetrics & Gynecology

## 2020-11-26 ENCOUNTER — Telehealth: Payer: Self-pay | Admitting: Obstetrics & Gynecology

## 2020-11-26 MED ORDER — FLUCONAZOLE 150 MG PO TABS: ORAL_TABLET | ORAL | 1 refills | Status: DC

## 2020-11-26 NOTE — Telephone Encounter (Signed)
Patient called stating that she seen her PCP and he prescribed her Amoxicillin for her Sinus infection, patient states that this medication always gives her a yeast infection and Dr. Elonda Husky normally calls her ina  medication that prevents that from happening. Patient would like Dr. Elonda Husky to call in that medication to her [facility] in [place].

## 2020-12-02 DIAGNOSIS — Z13220 Encounter for screening for lipoid disorders: Secondary | ICD-10-CM | POA: Diagnosis not present

## 2020-12-02 DIAGNOSIS — Z0000 Encounter for general adult medical examination without abnormal findings: Secondary | ICD-10-CM | POA: Diagnosis not present

## 2020-12-02 DIAGNOSIS — Z1329 Encounter for screening for other suspected endocrine disorder: Secondary | ICD-10-CM | POA: Diagnosis not present

## 2020-12-02 DIAGNOSIS — E785 Hyperlipidemia, unspecified: Secondary | ICD-10-CM | POA: Diagnosis not present

## 2020-12-02 DIAGNOSIS — Z1321 Encounter for screening for nutritional disorder: Secondary | ICD-10-CM | POA: Diagnosis not present

## 2020-12-02 DIAGNOSIS — Z131 Encounter for screening for diabetes mellitus: Secondary | ICD-10-CM | POA: Diagnosis not present

## 2020-12-12 DIAGNOSIS — M810 Age-related osteoporosis without current pathological fracture: Secondary | ICD-10-CM | POA: Diagnosis not present

## 2020-12-15 DIAGNOSIS — R682 Dry mouth, unspecified: Secondary | ICD-10-CM | POA: Diagnosis not present

## 2020-12-15 DIAGNOSIS — M150 Primary generalized (osteo)arthritis: Secondary | ICD-10-CM | POA: Diagnosis not present

## 2020-12-15 DIAGNOSIS — R768 Other specified abnormal immunological findings in serum: Secondary | ICD-10-CM | POA: Diagnosis not present

## 2020-12-15 DIAGNOSIS — M545 Low back pain, unspecified: Secondary | ICD-10-CM | POA: Diagnosis not present

## 2020-12-15 DIAGNOSIS — M791 Myalgia, unspecified site: Secondary | ICD-10-CM | POA: Diagnosis not present

## 2020-12-15 DIAGNOSIS — R519 Headache, unspecified: Secondary | ICD-10-CM | POA: Diagnosis not present

## 2020-12-15 DIAGNOSIS — M25531 Pain in right wrist: Secondary | ICD-10-CM | POA: Diagnosis not present

## 2020-12-15 DIAGNOSIS — R5382 Chronic fatigue, unspecified: Secondary | ICD-10-CM | POA: Diagnosis not present

## 2020-12-15 DIAGNOSIS — Z6831 Body mass index (BMI) 31.0-31.9, adult: Secondary | ICD-10-CM | POA: Diagnosis not present

## 2020-12-22 DIAGNOSIS — C711 Malignant neoplasm of frontal lobe: Secondary | ICD-10-CM | POA: Diagnosis not present

## 2020-12-22 DIAGNOSIS — G894 Chronic pain syndrome: Secondary | ICD-10-CM | POA: Diagnosis not present

## 2020-12-22 DIAGNOSIS — G4700 Insomnia, unspecified: Secondary | ICD-10-CM | POA: Diagnosis not present

## 2020-12-22 DIAGNOSIS — F411 Generalized anxiety disorder: Secondary | ICD-10-CM | POA: Diagnosis not present

## 2020-12-23 DIAGNOSIS — D179 Benign lipomatous neoplasm, unspecified: Secondary | ICD-10-CM | POA: Diagnosis not present

## 2020-12-23 DIAGNOSIS — M25561 Pain in right knee: Secondary | ICD-10-CM | POA: Diagnosis not present

## 2020-12-23 DIAGNOSIS — M549 Dorsalgia, unspecified: Secondary | ICD-10-CM | POA: Diagnosis not present

## 2020-12-23 DIAGNOSIS — F17200 Nicotine dependence, unspecified, uncomplicated: Secondary | ICD-10-CM | POA: Diagnosis not present

## 2020-12-23 DIAGNOSIS — M7061 Trochanteric bursitis, right hip: Secondary | ICD-10-CM | POA: Diagnosis not present

## 2020-12-23 DIAGNOSIS — J329 Chronic sinusitis, unspecified: Secondary | ICD-10-CM | POA: Diagnosis not present

## 2020-12-23 DIAGNOSIS — Z87898 Personal history of other specified conditions: Secondary | ICD-10-CM | POA: Diagnosis not present

## 2020-12-23 DIAGNOSIS — R109 Unspecified abdominal pain: Secondary | ICD-10-CM | POA: Diagnosis not present

## 2021-01-12 DIAGNOSIS — M47817 Spondylosis without myelopathy or radiculopathy, lumbosacral region: Secondary | ICD-10-CM | POA: Diagnosis not present

## 2021-01-12 DIAGNOSIS — M25561 Pain in right knee: Secondary | ICD-10-CM | POA: Diagnosis not present

## 2021-01-12 DIAGNOSIS — M7061 Trochanteric bursitis, right hip: Secondary | ICD-10-CM | POA: Diagnosis not present

## 2021-01-12 DIAGNOSIS — M5137 Other intervertebral disc degeneration, lumbosacral region: Secondary | ICD-10-CM | POA: Diagnosis not present

## 2021-01-12 DIAGNOSIS — G8929 Other chronic pain: Secondary | ICD-10-CM | POA: Diagnosis not present

## 2021-01-21 DIAGNOSIS — F411 Generalized anxiety disorder: Secondary | ICD-10-CM | POA: Diagnosis not present

## 2021-01-21 DIAGNOSIS — C711 Malignant neoplasm of frontal lobe: Secondary | ICD-10-CM | POA: Diagnosis not present

## 2021-01-21 DIAGNOSIS — G894 Chronic pain syndrome: Secondary | ICD-10-CM | POA: Diagnosis not present

## 2021-01-30 DIAGNOSIS — M545 Low back pain, unspecified: Secondary | ICD-10-CM | POA: Diagnosis not present

## 2021-01-30 DIAGNOSIS — M47817 Spondylosis without myelopathy or radiculopathy, lumbosacral region: Secondary | ICD-10-CM | POA: Diagnosis not present

## 2021-01-30 DIAGNOSIS — M5137 Other intervertebral disc degeneration, lumbosacral region: Secondary | ICD-10-CM | POA: Diagnosis not present

## 2021-02-17 DIAGNOSIS — Z6831 Body mass index (BMI) 31.0-31.9, adult: Secondary | ICD-10-CM | POA: Diagnosis not present

## 2021-02-17 DIAGNOSIS — G894 Chronic pain syndrome: Secondary | ICD-10-CM | POA: Diagnosis not present

## 2021-02-17 DIAGNOSIS — M150 Primary generalized (osteo)arthritis: Secondary | ICD-10-CM | POA: Diagnosis not present

## 2021-02-17 DIAGNOSIS — M545 Low back pain, unspecified: Secondary | ICD-10-CM | POA: Diagnosis not present

## 2021-02-17 DIAGNOSIS — E669 Obesity, unspecified: Secondary | ICD-10-CM | POA: Diagnosis not present

## 2021-02-17 DIAGNOSIS — R5382 Chronic fatigue, unspecified: Secondary | ICD-10-CM | POA: Diagnosis not present

## 2021-02-17 DIAGNOSIS — M791 Myalgia, unspecified site: Secondary | ICD-10-CM | POA: Diagnosis not present

## 2021-02-17 DIAGNOSIS — R768 Other specified abnormal immunological findings in serum: Secondary | ICD-10-CM | POA: Diagnosis not present

## 2021-02-24 DIAGNOSIS — M5137 Other intervertebral disc degeneration, lumbosacral region: Secondary | ICD-10-CM | POA: Diagnosis not present

## 2021-02-24 DIAGNOSIS — M7989 Other specified soft tissue disorders: Secondary | ICD-10-CM | POA: Diagnosis not present

## 2021-02-24 DIAGNOSIS — M25511 Pain in right shoulder: Secondary | ICD-10-CM | POA: Diagnosis not present

## 2021-02-24 DIAGNOSIS — M47817 Spondylosis without myelopathy or radiculopathy, lumbosacral region: Secondary | ICD-10-CM | POA: Diagnosis not present

## 2021-02-25 DIAGNOSIS — F411 Generalized anxiety disorder: Secondary | ICD-10-CM | POA: Diagnosis not present

## 2021-02-25 DIAGNOSIS — C711 Malignant neoplasm of frontal lobe: Secondary | ICD-10-CM | POA: Diagnosis not present

## 2021-02-25 DIAGNOSIS — R110 Nausea: Secondary | ICD-10-CM | POA: Diagnosis not present

## 2021-02-25 DIAGNOSIS — G894 Chronic pain syndrome: Secondary | ICD-10-CM | POA: Diagnosis not present

## 2021-02-25 DIAGNOSIS — G4700 Insomnia, unspecified: Secondary | ICD-10-CM | POA: Diagnosis not present

## 2021-03-09 DIAGNOSIS — Z87898 Personal history of other specified conditions: Secondary | ICD-10-CM | POA: Diagnosis not present

## 2021-03-09 DIAGNOSIS — D179 Benign lipomatous neoplasm, unspecified: Secondary | ICD-10-CM | POA: Diagnosis not present

## 2021-03-09 DIAGNOSIS — J329 Chronic sinusitis, unspecified: Secondary | ICD-10-CM | POA: Diagnosis not present

## 2021-03-09 DIAGNOSIS — F332 Major depressive disorder, recurrent severe without psychotic features: Secondary | ICD-10-CM | POA: Diagnosis not present

## 2021-03-09 DIAGNOSIS — M5430 Sciatica, unspecified side: Secondary | ICD-10-CM | POA: Diagnosis not present

## 2021-03-09 DIAGNOSIS — M7061 Trochanteric bursitis, right hip: Secondary | ICD-10-CM | POA: Diagnosis not present

## 2021-03-09 DIAGNOSIS — F17200 Nicotine dependence, unspecified, uncomplicated: Secondary | ICD-10-CM | POA: Diagnosis not present

## 2021-03-09 DIAGNOSIS — Z13220 Encounter for screening for lipoid disorders: Secondary | ICD-10-CM | POA: Diagnosis not present

## 2021-03-19 DIAGNOSIS — M7989 Other specified soft tissue disorders: Secondary | ICD-10-CM | POA: Diagnosis not present

## 2021-03-21 ENCOUNTER — Other Ambulatory Visit: Payer: Self-pay | Admitting: Obstetrics & Gynecology

## 2021-03-23 DIAGNOSIS — F411 Generalized anxiety disorder: Secondary | ICD-10-CM | POA: Diagnosis not present

## 2021-03-23 DIAGNOSIS — G4700 Insomnia, unspecified: Secondary | ICD-10-CM | POA: Diagnosis not present

## 2021-03-23 DIAGNOSIS — C711 Malignant neoplasm of frontal lobe: Secondary | ICD-10-CM | POA: Diagnosis not present

## 2021-03-23 DIAGNOSIS — G894 Chronic pain syndrome: Secondary | ICD-10-CM | POA: Diagnosis not present

## 2021-03-24 DIAGNOSIS — R2231 Localized swelling, mass and lump, right upper limb: Secondary | ICD-10-CM | POA: Diagnosis not present

## 2021-03-24 DIAGNOSIS — Z86018 Personal history of other benign neoplasm: Secondary | ICD-10-CM | POA: Diagnosis not present

## 2021-03-24 DIAGNOSIS — M7989 Other specified soft tissue disorders: Secondary | ICD-10-CM | POA: Diagnosis not present

## 2021-03-30 NOTE — Progress Notes (Deleted)
Referring Provider: [place] Nation, MD Primary Care Physician:  [place] Nation, MD Primary GI Physician: Dr. Gala Romney  No chief complaint on file.   HPI:   Jill Harrell is a 61 y.o. female with GI history of GERD, dysphagia, constipation.Per prior office notes, negative Cologuard in 2017, previously declined colonoscopies.  Fecal occult blood negative in May 2022.  EGD in February 2020 for dysphagia with normal esophagus s/p dilation, mucosal changes in the stomach of uncertain significance s/p biopsy, normal examined duodenum.  Pathology with chronic inactive gastritis, negative for H. Pylori.  Also with history of glioblastoma of right frontal lobe undergoing routine MRI surveillance with [facility].  She is presenting today with chief complaint of abdominal pain, nausea, vomiting. ***  Last seen in our office 09/11/2019.  She was having trouble with constipation and was not able to afford Amitiza.  Failed multiple over-the-counter agents.  Associated lower abdominal pain that improved somewhat after a bowel movement.  Associated nausea/vomiting.  GERD is well controlled on Protonix twice daily and dysphagia symptoms had significantly improved after dilation but was starting to have some problems with large pills.  Also noted possible portal hypertensive gastropathy on prior EGD though no known history of cirrhosis.  Plan to look for formulary to see what options were available for constipation management, send prescription for Movantik 25 mg daily in the meantime, continue Protonix 40 mg twice daily, ultrasound abdomen and labs to evaluate for possible cirrhosis.  Staff pulled formulary for constipation medications.  Amitiza and Symproic not covered.  Movantik and Trulance covered a tier 3.  Labs were not completed. Abdominal ultrasound: Hepatic steatosis, no capsular nodularity, no splenomegaly, small simple cyst in the liver and left kidney.  Today:    Past Medical History:   Diagnosis Date   Anxiety    Arthritis    hip and wrist   Brain cancer ([place])    2009 glioblastoma (stage 4) Dr. Briant Cedar [facility]   Cancer [facility])    Degenerative disc disease at L5-S1 level    GERD (gastroesophageal reflux disease)    Seizures ([place])    brain cancer    Past Surgical History:  Procedure Laterality Date   ABDOMINAL HYSTERECTOMY     APPENDECTOMY     BIOPSY  04/24/2018   Procedure: BIOPSY;  Surgeon: Daneil Dolin, MD;  [facility];  Service: Endoscopy;;  gastric   BRAIN SURGERY X2     CHOLECYSTECTOMY     ESOPHAGOGASTRODUODENOSCOPY (EGD) WITH PROPOFOL N/A 04/24/2018   Dr. Gala Romney: Normal esophagus.  Status post dilation for history of dysphagia.  Diffuse moderate mucosal changes in the entire stomach consistent with portal hypertensive gastropathy.  Biopsy with chronic inactive gastritis, no H. pylori.   MALONEY DILATION N/A 04/24/2018   Procedure: Venia Minks DILATION;  Surgeon: Daneil Dolin, MD;  Location[facility];  Service: Endoscopy;  Laterality: N/A;    Current Outpatient Medications  Medication Sig Dispense Refill   ALPRAZolam (XANAX) 1 MG tablet Take 1 tablet by mouth every 6 (six) hours as needed.     Aspirin-Acetaminophen-Caffeine (EXCEDRIN MIGRAINE PO) Take by mouth.     budesonide-formoterol (SYMBICORT) 160-4.5 MCG/ACT inhaler Inhale 2 puffs into the lungs 2 (two) times daily. 1 Inhaler 11   divalproex (DEPAKOTE) 500 MG DR tablet Take 3 tablets (1,500 mg total) by mouth at bedtime. (Patient taking differently: Take 1,000 mg by mouth at bedtime.) 90 tablet 3   estradiol (VIVELLE-DOT) 0.1 MG/24HR patch  Place 1 patch (0.1 mg total) onto the skin 2 (two) times a week. 24 patch 3   fluconazole (DIFLUCAN) 150 MG tablet Take 1 tablet today and repeat in 3 days 2 tablet 1   HYDROcodone-acetaminophen (NORCO) 10-325 MG tablet Take 1 tablet by mouth every 6 (six) hours as needed (pain). 120 tablet 0   methadone (DOLOPHINE) 10 MG tablet Take 1 tablet (10 mg  total) by mouth every 8 (eight) hours. (Patient taking differently: Take 20 mg by mouth as needed.) 90 tablet 0   mirabegron ER (MYRBETRIQ) 50 MG TB24 tablet Take 1 tablet (50 mg total) by mouth daily. 90 tablet 3   Multiple Vitamin (MULTIVITAMIN) tablet Take 1 tablet by mouth daily.     naloxegol oxalate (MOVANTIK) 25 MG TABS tablet Take 1 tablet (25 mg total) by mouth daily. 90 tablet 3   Omega-3 Fatty Acids (FISH OIL) 1000 MG CAPS Take by mouth daily.      pantoprazole (PROTONIX) 40 MG tablet Take 1 tablet (40 mg total) by mouth 2 (two) times daily before a meal. 60 tablet 3   polyethylene glycol (MIRALAX / GLYCOLAX) 17 g packet Take 17 g by mouth.     Promethazine HCl (PHENERGAN PO) Take by mouth.     sertraline (ZOLOFT) 50 MG tablet Take 50 mg by mouth daily.     zolpidem (AMBIEN) 10 MG tablet TK 1 T PO QD HS  0   No current facility-administered medications for this visit.    Allergies as of 04/01/2021 - Review Complete 07/10/2020  Allergen Reaction Noted   Mixed ragweed Other (See Comments) 04/17/2014   Pollen extract Other (See Comments) 04/17/2014    Family History  Adopted: Yes  Problem Relation Age of Onset   Heart disease Father    Hypotension Father    Diabetes Father    Allergic rhinitis Father    Hyperlipidemia Maternal Grandfather    Hypertension Maternal Grandfather    Heart disease Maternal Grandfather    Allergic rhinitis Maternal Grandfather    Allergic rhinitis Mother    Allergic rhinitis Maternal Grandmother    Lymphoma Maternal Grandmother    Asthma Brother    Allergic rhinitis Brother    Asthma Brother    Allergic rhinitis Brother    COPD Brother    Allergic rhinitis Brother    Angioedema Neg Hx    Atopy Neg Hx    Eczema Neg Hx    Immunodeficiency Neg Hx    Urticaria Neg Hx    Colon cancer Neg Hx     Social History   Socioeconomic History   Marital status: Married    Spouse name: Not on file   Number of children: Not on file   Years of  education: Not on file   Highest education level: Not on file  Occupational History   Not on file  Tobacco Use   Smoking status: Every Day    Packs/day: 1.00    Years: 23.00    Pack years: 23.00    Types: Cigarettes   Smokeless tobacco: Never  Vaping Use   Vaping Use: Never used  Substance and Sexual Activity   Alcohol use: Yes    Alcohol/week: 0.0 standard drinks    Comment: special occassion   Drug use: No   Sexual activity: Yes    Birth control/protection: Surgical    Comment: hyst  Other Topics Concern   Not on file  Social History Narrative   Not on file  Social Determinants of Health   Financial Resource Strain: High Risk   Difficulty of Paying Living Expenses: Hard  Food Insecurity: Landscape architect Present   Worried About Charity fundraiser in the Last Year: Often true   Arboriculturist in the Last Year: Often true  Transportation Needs: Public librarian (Medical): Yes   Lack of Transportation (Non-Medical): Yes  Physical Activity: Inactive   Days of Exercise per Week: 0 days   Minutes of Exercise per Session: 0 min  Stress: Stress Concern Present   Feeling of Stress : Rather much  Social Connections: Socially Integrated   Frequency of Communication with Friends and Family: More than three times a week   Frequency of Social Gatherings with Friends and Family: Twice a week   Attends Religious Services: More than 4 times per year   Active Member of [employer] or Organizations: Yes   Attends Music therapist: More than 4 times per year   Marital Status: Married    Review of Systems: Gen: Denies fever, chills, anorexia. Denies fatigue, weakness, weight loss.  CV: Denies chest pain, palpitations, syncope, peripheral edema, and claudication. Resp: Denies dyspnea at rest, cough, wheezing, coughing up blood, and pleurisy. GI: Denies vomiting blood, jaundice, and fecal incontinence.   Denies dysphagia or  odynophagia. Derm: Denies rash, itching, dry skin Psych: Denies depression, anxiety, memory loss, confusion. No homicidal or suicidal ideation.  Heme: Denies bruising, bleeding, and enlarged lymph nodes.  Physical Exam: There were no vitals taken for this visit. General:   Alert and oriented. No distress noted. Pleasant and cooperative.  Head:  Normocephalic and atraumatic. Eyes:  Conjuctiva clear without scleral icterus. Mouth:  Oral mucosa pink and moist. Good dentition. No lesions. Heart:  S1, S2 present without murmurs appreciated. Lungs:  Clear to auscultation bilaterally. No wheezes, rales, or rhonchi. No distress.  Abdomen:  +BS, soft, non-tender and non-distended. No rebound or guarding. No HSM or masses noted. Msk:  Symmetrical without gross deformities. Normal posture. Extremities:  Without edema. Neurologic:  Alert and  oriented x4 Psych:  Alert and cooperative. Normal mood and affect.

## 2021-04-01 ENCOUNTER — Ambulatory Visit: Payer: Medicare HMO | Admitting: Gastroenterology

## 2021-04-03 DIAGNOSIS — D1721 Benign lipomatous neoplasm of skin and subcutaneous tissue of right arm: Secondary | ICD-10-CM | POA: Diagnosis not present

## 2021-04-03 DIAGNOSIS — R2231 Localized swelling, mass and lump, right upper limb: Secondary | ICD-10-CM | POA: Diagnosis not present

## 2021-04-03 DIAGNOSIS — M87821 Other osteonecrosis, right humerus: Secondary | ICD-10-CM | POA: Diagnosis not present

## 2021-04-03 DIAGNOSIS — D1779 Benign lipomatous neoplasm of other sites: Secondary | ICD-10-CM | POA: Diagnosis not present

## 2021-04-10 DIAGNOSIS — Z01818 Encounter for other preprocedural examination: Secondary | ICD-10-CM | POA: Diagnosis not present

## 2021-04-14 DIAGNOSIS — F17210 Nicotine dependence, cigarettes, uncomplicated: Secondary | ICD-10-CM | POA: Diagnosis not present

## 2021-04-14 DIAGNOSIS — F419 Anxiety disorder, unspecified: Secondary | ICD-10-CM | POA: Diagnosis not present

## 2021-04-14 DIAGNOSIS — M1990 Unspecified osteoarthritis, unspecified site: Secondary | ICD-10-CM | POA: Diagnosis not present

## 2021-04-14 DIAGNOSIS — G43909 Migraine, unspecified, not intractable, without status migrainosus: Secondary | ICD-10-CM | POA: Diagnosis not present

## 2021-04-14 DIAGNOSIS — Z7951 Long term (current) use of inhaled steroids: Secondary | ICD-10-CM | POA: Diagnosis not present

## 2021-04-14 DIAGNOSIS — R569 Unspecified convulsions: Secondary | ICD-10-CM | POA: Diagnosis not present

## 2021-04-14 DIAGNOSIS — K219 Gastro-esophageal reflux disease without esophagitis: Secondary | ICD-10-CM | POA: Diagnosis not present

## 2021-04-14 DIAGNOSIS — D1721 Benign lipomatous neoplasm of skin and subcutaneous tissue of right arm: Secondary | ICD-10-CM | POA: Diagnosis not present

## 2021-04-14 DIAGNOSIS — Z85841 Personal history of malignant neoplasm of brain: Secondary | ICD-10-CM | POA: Diagnosis not present

## 2021-04-21 DIAGNOSIS — C711 Malignant neoplasm of frontal lobe: Secondary | ICD-10-CM | POA: Diagnosis not present

## 2021-04-21 DIAGNOSIS — G4700 Insomnia, unspecified: Secondary | ICD-10-CM | POA: Diagnosis not present

## 2021-04-21 DIAGNOSIS — G894 Chronic pain syndrome: Secondary | ICD-10-CM | POA: Diagnosis not present

## 2021-04-21 DIAGNOSIS — F411 Generalized anxiety disorder: Secondary | ICD-10-CM | POA: Diagnosis not present

## 2021-05-19 DIAGNOSIS — C711 Malignant neoplasm of frontal lobe: Secondary | ICD-10-CM | POA: Diagnosis not present

## 2021-05-19 DIAGNOSIS — G894 Chronic pain syndrome: Secondary | ICD-10-CM | POA: Diagnosis not present

## 2021-05-19 DIAGNOSIS — F411 Generalized anxiety disorder: Secondary | ICD-10-CM | POA: Diagnosis not present

## 2021-05-19 DIAGNOSIS — G4700 Insomnia, unspecified: Secondary | ICD-10-CM | POA: Diagnosis not present

## 2021-06-01 DIAGNOSIS — R5382 Chronic fatigue, unspecified: Secondary | ICD-10-CM | POA: Diagnosis not present

## 2021-06-01 DIAGNOSIS — G894 Chronic pain syndrome: Secondary | ICD-10-CM | POA: Diagnosis not present

## 2021-06-01 DIAGNOSIS — R768 Other specified abnormal immunological findings in serum: Secondary | ICD-10-CM | POA: Diagnosis not present

## 2021-06-01 DIAGNOSIS — M1991 Primary osteoarthritis, unspecified site: Secondary | ICD-10-CM | POA: Diagnosis not present

## 2021-06-23 DIAGNOSIS — F411 Generalized anxiety disorder: Secondary | ICD-10-CM | POA: Diagnosis not present

## 2021-06-23 DIAGNOSIS — G4700 Insomnia, unspecified: Secondary | ICD-10-CM | POA: Diagnosis not present

## 2021-06-23 DIAGNOSIS — G894 Chronic pain syndrome: Secondary | ICD-10-CM | POA: Diagnosis not present

## 2021-06-23 DIAGNOSIS — C711 Malignant neoplasm of frontal lobe: Secondary | ICD-10-CM | POA: Diagnosis not present

## 2021-06-23 DIAGNOSIS — M069 Rheumatoid arthritis, unspecified: Secondary | ICD-10-CM | POA: Diagnosis not present

## 2021-07-02 DIAGNOSIS — M7989 Other specified soft tissue disorders: Secondary | ICD-10-CM | POA: Diagnosis not present

## 2021-07-02 DIAGNOSIS — D1721 Benign lipomatous neoplasm of skin and subcutaneous tissue of right arm: Secondary | ICD-10-CM | POA: Diagnosis not present

## 2021-07-08 DIAGNOSIS — H5210 Myopia, unspecified eye: Secondary | ICD-10-CM | POA: Diagnosis not present

## 2021-07-08 DIAGNOSIS — E7800 Pure hypercholesterolemia, unspecified: Secondary | ICD-10-CM | POA: Diagnosis not present

## 2021-07-08 DIAGNOSIS — Z0100 Encounter for examination of eyes and vision without abnormal findings: Secondary | ICD-10-CM | POA: Diagnosis not present

## 2021-07-14 ENCOUNTER — Other Ambulatory Visit: Payer: Medicare HMO | Admitting: Obstetrics & Gynecology

## 2021-07-21 DIAGNOSIS — C711 Malignant neoplasm of frontal lobe: Secondary | ICD-10-CM | POA: Diagnosis not present

## 2021-07-21 DIAGNOSIS — G4700 Insomnia, unspecified: Secondary | ICD-10-CM | POA: Diagnosis not present

## 2021-07-21 DIAGNOSIS — F411 Generalized anxiety disorder: Secondary | ICD-10-CM | POA: Diagnosis not present

## 2021-07-21 DIAGNOSIS — G894 Chronic pain syndrome: Secondary | ICD-10-CM | POA: Diagnosis not present

## 2021-07-21 NOTE — Progress Notes (Unsigned)
Referring Provider: [place] Nation, MD Primary Care Physician:  [place] Nation, MD Primary GI Physician: Dr. Rayne Du chief complaint on file.   HPI:   Jill Harrell is a 61 y.o. female with history of GERD, dysphagia, constipation on  chronic pain medications since surgery for glioblastoma in 2009 with recurrence and additional surgery about 18 months later, currently undergoing MRI surveillance with [facility].  Negative Cologuard in 2017 per prior notes, previously declined colonoscopies.  Fecal occult blood negative in May 2022. Last EGD in February 2020 with normal esophagus s/p dilation, mucosal changes in the stomach of uncertain significance s/p biopsy, normal examined duodenum.  Pathology with chronic inactive gastritis.  She is presenting today with chief complaint of ***  Last seen in our office 09/11/2019.  She was struggling with constipation.  Amitiza and Linzess too expensive.  Had not received form to complete patient assistance.  Was taking MiraLAX 3 times a day without improvement.  Has also tried stool softener and Dulcolax.  Bowels moving every 3 to 5 days associated lower abdominal pain as well as nausea and vomiting.  She is taking Zofran for nausea.  Continued on chronic pain medications for glioblastoma.  GERD is well controlled on Protonix.  Dysphagia significantly improved after dilation, but started having problems with her seizure medication which is a large pill.  Her abdominal exam was benign.  Noted on her prior EGD, she had some findings that were indicative of possible portal hypertensive gastropathy.  Recommendations included CBC, CMP, abdominal ultrasound,, continue pantoprazole, and start Movantik.  Recommended 93-monthfollow-up.  Abdominal ultrasound with fatty liver, no evidence of cirrhosis or splenomegaly.  S/p cholecystectomy.  Labs ordered were not completed.  Most recent labs in February 2023; hemoglobin within normal limits, platelets normal, BUN  slightly elevated 23, creatinine 0.62, electrolytes within normal limits.  Patient has canceled or no-showed multiple visits.  Today:   Past Medical History:  Diagnosis Date   Anxiety    Arthritis    hip and wrist   [place]    [facility]   [facility]    Degenerative disc disease at L5-S1 level    GERD (gastroesophageal reflux disease)    Seizures ([place]    brain cancer    Past Surgical History:  Procedure Laterality Date   ABDOMINAL HYSTERECTOMY     APPENDECTOMY     BIOPSY  04/24/2018   Procedure: BIOPSY;  Surgeon: RDaneil Dolin MD;  [facility];  Service: Endoscopy;;  gastric   BRAIN SURGERY X2     CHOLECYSTECTOMY     ESOPHAGOGASTRODUODENOSCOPY (EGD) WITH PROPOFOL N/A 04/24/2018   Dr. RGala Romney Normal esophagus.  Status post dilation for history of dysphagia.  Diffuse moderate mucosal changes in the entire stomach consistent with portal hypertensive gastropathy.  Biopsy with chronic inactive gastritis, no H. pylori.   MALONEY DILATION N/A 04/24/2018   Procedure: MVenia MinksDILATION;  Surgeon: RDaneil Dolin MD;  [facility];  Service: Endoscopy;  Laterality: N/A;    Current Outpatient Medications  Medication Sig Dispense Refill   ALPRAZolam (XANAX) 1 MG tablet Take 1 tablet by mouth every 6 (six) hours as needed.     Aspirin-Acetaminophen-Caffeine (EXCEDRIN MIGRAINE PO) Take by mouth.     budesonide-formoterol (SYMBICORT) 160-4.5 MCG/ACT inhaler Inhale 2 puffs into the lungs 2 (two) times daily. 1 Inhaler 11   divalproex (DEPAKOTE) 500 MG DR tablet Take 3 tablets (  1,500 mg total) by mouth at bedtime. (Patient taking differently: Take 1,000 mg by mouth at bedtime.) 90 tablet 3   estradiol (VIVELLE-DOT) 0.1 MG/24HR patch Place 1 patch (0.1 mg total) onto the skin 2 (two) times a week. 24 patch 3   fluconazole (DIFLUCAN) 150 MG tablet TAKE ONE TABLET BY MOUTH TODAY AND REPEAT IN 3 DAYS 2 tablet 0    HYDROcodone-acetaminophen (NORCO) 10-325 MG tablet Take 1 tablet by mouth every 6 (six) hours as needed (pain). 120 tablet 0   methadone (DOLOPHINE) 10 MG tablet Take 1 tablet (10 mg total) by mouth every 8 (eight) hours. (Patient taking differently: Take 20 mg by mouth as needed.) 90 tablet 0   mirabegron ER (MYRBETRIQ) 50 MG TB24 tablet Take 1 tablet (50 mg total) by mouth daily. 90 tablet 3   Multiple Vitamin (MULTIVITAMIN) tablet Take 1 tablet by mouth daily.     naloxegol oxalate (MOVANTIK) 25 MG TABS tablet Take 1 tablet (25 mg total) by mouth daily. 90 tablet 3   Omega-3 Fatty Acids (FISH OIL) 1000 MG CAPS Take by mouth daily.      pantoprazole (PROTONIX) 40 MG tablet Take 1 tablet (40 mg total) by mouth 2 (two) times daily before a meal. 60 tablet 3   polyethylene glycol (MIRALAX / GLYCOLAX) 17 g packet Take 17 g by mouth.     Promethazine HCl (PHENERGAN PO) Take by mouth.     sertraline (ZOLOFT) 50 MG tablet Take 50 mg by mouth daily.     zolpidem (AMBIEN) 10 MG tablet TK 1 T PO QD HS  0   No current facility-administered medications for this visit.    Allergies as of 07/22/2021 - Review Complete 07/10/2020  Allergen Reaction Noted   Mixed ragweed Other (See Comments) 04/17/2014   Pollen extract Other (See Comments) 04/17/2014    Family History  Adopted: Yes  Problem Relation Age of Onset   Heart disease Father    Hypotension Father    Diabetes Father    Allergic rhinitis Father    Hyperlipidemia Maternal Grandfather    Hypertension Maternal Grandfather    Heart disease Maternal Grandfather    Allergic rhinitis Maternal Grandfather    Allergic rhinitis Mother    Allergic rhinitis Maternal Grandmother    Lymphoma Maternal Grandmother    Asthma Brother    Allergic rhinitis Brother    Asthma Brother    Allergic rhinitis Brother    COPD Brother    Allergic rhinitis Brother    Angioedema Neg Hx    Atopy Neg Hx    Eczema Neg Hx    Immunodeficiency Neg Hx    Urticaria  Neg Hx    Colon cancer Neg Hx     Social History   Socioeconomic History   Marital status: Married    Spouse name: Not on file   Number of children: Not on file   Years of education: Not on file   Highest education level: Not on file  Occupational History   Not on file  Tobacco Use   Smoking status: Every Day    Packs/day: 1.00    Years: 23.00    Pack years: 23.00    Types: Cigarettes   Smokeless tobacco: Never  Vaping Use   Vaping Use: Never used  Substance and Sexual Activity   Alcohol use: Yes    Alcohol/week: 0.0 standard drinks    Comment: special occassion   Drug use: No   Sexual activity: Yes  Birth control/protection: Surgical    Comment: hyst  Other Topics Concern   Not on file  Social History Narrative   Not on file   Social Determinants of Health   Financial Resource Strain: Not on file  Food Insecurity: Not on file  Transportation Needs: Not on file  Physical Activity: Not on file  Stress: Not on file  Social Connections: Not on file    Review of Systems: Gen: Denies fever, chills, cold or flulike symptoms, presyncope, syncope. CV: Denies chest pain, palpitations. Resp: Denies dyspnea, cough.  GI: See HPI  Heme: See HPI  Physical Exam: There were no vitals taken for this visit. General:   Alert and oriented. No distress noted. Pleasant and cooperative.  Head:  Normocephalic and atraumatic. Eyes:  Conjuctiva clear without scleral icterus. Heart:  S1, S2 present without murmurs appreciated. Lungs:  Clear to auscultation bilaterally. No wheezes, rales, or rhonchi. No distress.  Abdomen:  +BS, soft, non-tender and non-distended. No rebound or guarding. No HSM or masses noted. Msk:  Symmetrical without gross deformities. Normal posture. Extremities:  Without edema. Neurologic:  Alert and  oriented x4 Psych:  Normal mood and affect.    Assessment:     Plan:  ***   Aliene Altes, PA-C [facility] 07/22/2021

## 2021-07-22 ENCOUNTER — Ambulatory Visit: Payer: Medicare HMO | Admitting: Gastroenterology

## 2021-08-10 ENCOUNTER — Other Ambulatory Visit (HOSPITAL_COMMUNITY)
Admission: RE | Admit: 2021-08-10 | Discharge: 2021-08-10 | Disposition: A | Discharge status: Home / Self Care | Payer: Medicare HMO | Source: Ambulatory Visit | Attending: Obstetrics & Gynecology | Admitting: Obstetrics & Gynecology

## 2021-08-10 ENCOUNTER — Encounter: Payer: Self-pay | Admitting: Obstetrics & Gynecology

## 2021-08-10 ENCOUNTER — Ambulatory Visit (INDEPENDENT_AMBULATORY_CARE_PROVIDER_SITE_OTHER): Payer: Medicare HMO | Admitting: Obstetrics & Gynecology

## 2021-08-10 DIAGNOSIS — N3281 Overactive bladder: Secondary | ICD-10-CM

## 2021-08-10 DIAGNOSIS — Z1151 Encounter for screening for human papillomavirus (HPV): Secondary | ICD-10-CM | POA: Diagnosis not present

## 2021-08-10 DIAGNOSIS — N951 Menopausal and female climacteric states: Secondary | ICD-10-CM | POA: Diagnosis not present

## 2021-08-10 DIAGNOSIS — Z01419 Encounter for gynecological examination (general) (routine) without abnormal findings: Secondary | ICD-10-CM | POA: Insufficient documentation

## 2021-08-10 MED ORDER — ESTRADIOL 0.1 MG/24HR TD PTTW: 1.0000 | MEDICATED_PATCH | TRANSDERMAL | 3 refills | Status: DC

## 2021-08-10 MED ORDER — MIRABEGRON ER 50 MG PO TB24: 50.0000 mg | ORAL_TABLET | Freq: Every day | ORAL | 11 refills | Status: DC

## 2021-08-10 NOTE — Progress Notes (Signed)
Subjective:     Jill Harrell is a 61 y.o. female here for a routine exam.  No LMP recorded. Patient has had a hysterectomy. [id number] Birth Control Method:  hysterectomy Menstrual Calendar(currently): amenorrhea  Current complaints: none.   Current acute medical issues:  none   Recent Gynecologic History No LMP recorded. Patient has had a hysterectomy. Last Pap: 3/21,  normal Last mammogram: 7/22,  normal  Past Medical History:  Diagnosis Date   Anxiety    Arthritis    hip and wrist   Brain cancer ([place])    2009 glioblastoma (stage 4) Dr. Briant Cedar [facility]   [facility])    Degenerative disc disease at L5-S1 level    GERD (gastroesophageal reflux disease)    Seizures ([place])    brain cancer    Past Surgical History:  Procedure Laterality Date   ABDOMINAL HYSTERECTOMY     APPENDECTOMY     BIOPSY  04/24/2018   Procedure: BIOPSY;  Surgeon: Daneil Dolin, MD;  [facility];  Service: Endoscopy;;  gastric   BRAIN SURGERY X2     CHOLECYSTECTOMY     ESOPHAGOGASTRODUODENOSCOPY (EGD) WITH PROPOFOL N/A 04/24/2018   Dr. Gala Romney: Normal esophagus.  Status post dilation for history of dysphagia.  Diffuse moderate mucosal changes in the entire stomach consistent with portal hypertensive gastropathy.  Biopsy with chronic inactive gastritis, no H. pylori.   MALONEY DILATION N/A 04/24/2018   Procedure: Venia Minks DILATION;  Surgeon: Daneil Dolin, MD;  [facility];  Service: Endoscopy;  Laterality: N/A;    OB History     Gravida  4   Para  0   Term      Preterm      AB  4   Living  0      SAB  4   IAB      Ectopic      Multiple      Live Births              Social History   Socioeconomic History   Marital status: Married    Spouse name: Not on file   Number of children: Not on file   Years of education: Not on file   Highest education level: Not on file  Occupational History   Not on file  Tobacco Use   Smoking status: Every Day     Packs/day: 1.00    Years: 23.00    Total pack years: 23.00    Types: Cigarettes   Smokeless tobacco: Never  Vaping Use   Vaping Use: Never used  Substance and Sexual Activity   Alcohol use: Yes    Alcohol/week: 0.0 standard drinks of alcohol    Comment: special occassion   Drug use: No   Sexual activity: Yes    Birth control/protection: Surgical    Comment: hyst  Other Topics Concern   Not on file  Social History Narrative   Not on file   Social Determinants of Health   Financial Resource Strain: Low Risk  (08/10/2021)   Overall Financial Resource Strain (CARDIA)    Difficulty of Paying Living Expenses: Not very hard  Food Insecurity: No Food Insecurity (08/10/2021)   Hunger Vital Sign    Worried About Running Out of Food in the Last Year: Never true    Ran Out of Food in the Last Year: Never true  Transportation Needs: No Transportation Needs (08/10/2021)   PRAPARE - Transportation    Lack of  Transportation (Medical): No    Lack of Transportation (Non-Medical): No  Physical Activity: Insufficiently Active (08/10/2021)   Exercise Vital Sign    Days of Exercise per Week: 2 days    Minutes of Exercise per Session: 20 min  Stress: Stress Concern Present (08/10/2021)   [place]    Feeling of Stress : Rather much  Social Connections: Socially Integrated (08/10/2021)   Social Connection and Isolation Panel [NHANES]    Frequency of Communication with Friends and Family: Once a week    Frequency of Social Gatherings with Friends and Family: Twice a week    Attends Religious Services: More than 4 times per year    Active Member of [employer] or Organizations: Yes    Attends Music therapist: More than 4 times per year    Marital Status: Married    Family History  Adopted: Yes  Problem Relation Age of Onset   Heart disease Father    Hypotension Father    Diabetes Father    Allergic rhinitis Father     Hyperlipidemia Maternal Grandfather    Hypertension Maternal Grandfather    Heart disease Maternal Grandfather    Allergic rhinitis Maternal Grandfather    Allergic rhinitis Mother    Allergic rhinitis Maternal Grandmother    Lymphoma Maternal Grandmother    Asthma Brother    Allergic rhinitis Brother    Asthma Brother    Allergic rhinitis Brother    COPD Brother    Allergic rhinitis Brother    Angioedema Neg Hx    Atopy Neg Hx    Eczema Neg Hx    Immunodeficiency Neg Hx    Urticaria Neg Hx    Colon cancer Neg Hx      Current Outpatient Medications:    budesonide-formoterol (SYMBICORT) 160-4.5 MCG/ACT inhaler, Inhale 2 puffs into the lungs 2 (two) times daily., Disp: 1 Inhaler, Rfl: 11   divalproex (DEPAKOTE) 500 MG DR tablet, Take 3 tablets (1,500 mg total) by mouth at bedtime. (Patient taking differently: Take 1,000 mg by mouth at bedtime.), Disp: 90 tablet, Rfl: 3   Multiple Vitamin (MULTIVITAMIN) tablet, Take 1 tablet by mouth daily., Disp: , Rfl:    naloxegol oxalate (MOVANTIK) 25 MG TABS tablet, Take 1 tablet (25 mg total) by mouth daily., Disp: 90 tablet, Rfl: 3   Omega-3 Fatty Acids (FISH OIL) 1000 MG CAPS, Take by mouth daily. , Disp: , Rfl:    pantoprazole (PROTONIX) 40 MG tablet, Take 1 tablet (40 mg total) by mouth 2 (two) times daily before a meal., Disp: 60 tablet, Rfl: 3   polyethylene glycol (MIRALAX / GLYCOLAX) 17 g packet, Take 17 g by mouth., Disp: , Rfl:    Promethazine HCl (PHENERGAN PO), Take by mouth., Disp: , Rfl:    sertraline (ZOLOFT) 50 MG tablet, Take 50 mg by mouth daily., Disp: , Rfl:    ALPRAZolam (XANAX) 1 MG tablet, Take 1 tablet by mouth every 6 (six) hours as needed. (Patient not taking: Reported on 08/10/2021), Disp: , Rfl:    Aspirin-Acetaminophen-Caffeine (EXCEDRIN MIGRAINE PO), Take by mouth. (Patient not taking: Reported on 08/10/2021), Disp: , Rfl:    estradiol (VIVELLE-DOT) 0.1 MG/24HR patch, Place 1 patch (0.1 mg total) onto the skin 2 (two)  times a week., Disp: 24 patch, Rfl: 3   fluconazole (DIFLUCAN) 150 MG tablet, TAKE ONE TABLET BY MOUTH TODAY AND REPEAT IN 3 DAYS (Patient not taking: Reported on 08/10/2021), Disp: 2  tablet, Rfl: 0   HYDROcodone-acetaminophen (NORCO) 10-325 MG tablet, Take 1 tablet by mouth every 6 (six) hours as needed (pain). (Patient not taking: Reported on 08/10/2021), Disp: 120 tablet, Rfl: 0   methadone (DOLOPHINE) 10 MG tablet, Take 1 tablet (10 mg total) by mouth every 8 (eight) hours. (Patient not taking: Reported on 08/10/2021), Disp: 90 tablet, Rfl: 0   mirabegron ER (MYRBETRIQ) 50 MG TB24 tablet, Take 1 tablet (50 mg total) by mouth daily., Disp: 30 tablet, Rfl: 11   zolpidem (AMBIEN) 10 MG tablet, TK 1 T PO QD HS (Patient not taking: Reported on 08/10/2021), Disp: , Rfl: 0  Review of Systems  Review of Systems  Constitutional: Negative for fever, chills, weight loss, malaise/fatigue and diaphoresis.  HENT: Negative for hearing loss, ear pain, nosebleeds, congestion, sore throat, neck pain, tinnitus and ear discharge.   Eyes: Negative for blurred vision, double vision, photophobia, pain, discharge and redness.  Respiratory: Negative for cough, hemoptysis, sputum production, shortness of breath, wheezing and stridor.   Cardiovascular: Negative for chest pain, palpitations, orthopnea, claudication, leg swelling and PND.  Gastrointestinal: negative for abdominal pain. Negative for heartburn, nausea, vomiting, diarrhea, constipation, blood in stool and melena.  Genitourinary: Negative for dysuria, urgency, frequency, hematuria and flank pain.  Musculoskeletal: Negative for myalgias, back pain, joint pain and falls.  Skin: Negative for itching and rash.  Neurological: Negative for dizziness, tingling, tremors, sensory change, speech change, focal weakness, seizures, loss of consciousness, weakness and headaches.  Endo/Heme/Allergies: Negative for environmental allergies and polydipsia. Does not bruise/bleed  easily.  Psychiatric/Behavioral: Negative for depression, suicidal ideas, hallucinations, memory loss and substance abuse. The patient is not nervous/anxious and does not have insomnia.        Objective:  Blood pressure 140/68, pulse 68, height '5\' 4"'$  (1.626 m), weight 180 lb (81.6 kg).   Physical Exam  Vitals reviewed. Constitutional: She is oriented to person, place, and time. She appears well-developed and well-nourished.  HENT:  Head: Normocephalic and atraumatic.        Right Ear: External ear normal.  Left Ear: External ear normal.  Nose: Nose normal.  Mouth/Throat: Oropharynx is clear and moist.  Eyes: Conjunctivae and EOM are normal. Pupils are equal, round, and reactive to light. Right eye exhibits no discharge. Left eye exhibits no discharge. No scleral icterus.  Neck: Normal range of motion. Neck supple. No tracheal deviation present. No thyromegaly present.  Cardiovascular: Normal rate, regular rhythm, normal heart sounds and intact distal pulses.  Exam reveals no gallop and no friction rub.   No murmur heard. Respiratory: Effort normal and breath sounds normal. No respiratory distress. She has no wheezes. She has no rales. She exhibits no tenderness.  GI: Soft. Bowel sounds are normal. She exhibits no distension and no mass. There is no tenderness. There is no rebound and no guarding.  Genitourinary:  Breasts no masses skin changes or nipple changes bilaterally      Vulva is normal without lesions Vagina is pink moist without discharge Cervix normal in appearance and pap is done Uterus is normal size shape and contour Adnexa is negative with normal sized ovaries  {Rectal    hemoccult negative, normal tone, no masses  Musculoskeletal: Normal range of motion. She exhibits no edema and no tenderness.  Neurological: She is alert and oriented to person, place, and time. She has normal reflexes. She displays normal reflexes. No cranial nerve deficit. She exhibits normal muscle  tone. Coordination normal.  Skin: Skin is warm  and dry. No rash noted. No erythema. No pallor.  Psychiatric: She has a normal mood and affect. Her behavior is normal. Judgment and thought content normal.       Medications Ordered at today's visit: Meds ordered this encounter  Medications   DISCONTD: estradiol (VIVELLE-DOT) 0.1 MG/24HR patch    Sig: Place 1 patch (0.1 mg total) onto the skin 2 (two) times a week.    Dispense:  24 patch    Refill:  3   mirabegron ER (MYRBETRIQ) 50 MG TB24 tablet    Sig: Take 1 tablet (50 mg total) by mouth daily.    Dispense:  30 tablet    Refill:  11   estradiol (VIVELLE-DOT) 0.1 MG/24HR patch    Sig: Place 1 patch (0.1 mg total) onto the skin 2 (two) times a week.    Dispense:  24 patch    Refill:  3    Other orders placed at today's visit: No orders of the defined types were placed in this encounter.     Assessment:    Normal Gyn exam.      ICD-10-CM   1. Well woman exam with routine gynecological exam  Z01.419     2. 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 Mammogram ordered. Follow up in: 3 years.     No follow-ups on file.

## 2021-08-10 NOTE — Addendum Note (Signed)
Addended by: Janece Canterbury on: 08/10/2021 04:45 PM   Modules accepted: Orders

## 2021-08-13 LAB — CYTOLOGY - PAP
Adequacy: ABSENT
Comment: NEGATIVE
Diagnosis: NEGATIVE
High risk HPV: NEGATIVE

## 2021-08-26 DIAGNOSIS — G894 Chronic pain syndrome: Secondary | ICD-10-CM | POA: Diagnosis not present

## 2021-08-26 DIAGNOSIS — C711 Malignant neoplasm of frontal lobe: Secondary | ICD-10-CM | POA: Diagnosis not present

## 2021-08-26 DIAGNOSIS — F411 Generalized anxiety disorder: Secondary | ICD-10-CM | POA: Diagnosis not present

## 2021-09-07 DIAGNOSIS — E7801 Familial hypercholesterolemia: Secondary | ICD-10-CM | POA: Diagnosis not present

## 2021-09-10 DIAGNOSIS — F17200 Nicotine dependence, unspecified, uncomplicated: Secondary | ICD-10-CM | POA: Diagnosis not present

## 2021-09-10 DIAGNOSIS — Z87898 Personal history of other specified conditions: Secondary | ICD-10-CM | POA: Diagnosis not present

## 2021-09-10 DIAGNOSIS — M810 Age-related osteoporosis without current pathological fracture: Secondary | ICD-10-CM | POA: Diagnosis not present

## 2021-09-10 DIAGNOSIS — R109 Unspecified abdominal pain: Secondary | ICD-10-CM | POA: Diagnosis not present

## 2021-09-10 DIAGNOSIS — J329 Chronic sinusitis, unspecified: Secondary | ICD-10-CM | POA: Diagnosis not present

## 2021-09-10 DIAGNOSIS — M7061 Trochanteric bursitis, right hip: Secondary | ICD-10-CM | POA: Diagnosis not present

## 2021-09-10 DIAGNOSIS — Z13220 Encounter for screening for lipoid disorders: Secondary | ICD-10-CM | POA: Diagnosis not present

## 2021-09-10 DIAGNOSIS — M5430 Sciatica, unspecified side: Secondary | ICD-10-CM | POA: Diagnosis not present

## 2021-09-10 DIAGNOSIS — D179 Benign lipomatous neoplasm, unspecified: Secondary | ICD-10-CM | POA: Diagnosis not present

## 2021-09-11 ENCOUNTER — Encounter: Payer: Self-pay | Admitting: Physical Medicine & Rehabilitation

## 2021-09-23 DIAGNOSIS — C711 Malignant neoplasm of frontal lobe: Secondary | ICD-10-CM | POA: Diagnosis not present

## 2021-09-23 DIAGNOSIS — G894 Chronic pain syndrome: Secondary | ICD-10-CM | POA: Diagnosis not present

## 2021-09-23 DIAGNOSIS — G4700 Insomnia, unspecified: Secondary | ICD-10-CM | POA: Diagnosis not present

## 2021-09-23 DIAGNOSIS — F411 Generalized anxiety disorder: Secondary | ICD-10-CM | POA: Diagnosis not present

## 2021-09-24 ENCOUNTER — Telehealth: Payer: Self-pay | Admitting: Obstetrics & Gynecology

## 2021-09-24 NOTE — Telephone Encounter (Signed)
Pt has a boil on the outside of her vagina. She would like some advice on what to do. Please call pt.

## 2021-09-24 NOTE — Telephone Encounter (Signed)
Patient states she has a painful boil in her vaginal area that is rubbing against her panties.  States it is not getting worse but is just so painful.  Advised to be seen tomorrow with Dr Nelda Marseille for further eval.  Pt agreeable to plan. Appt made.

## 2021-09-25 ENCOUNTER — Ambulatory Visit: Payer: Medicare HMO | Admitting: Obstetrics & Gynecology

## 2021-10-01 ENCOUNTER — Ambulatory Visit: Payer: Medicare HMO | Admitting: Obstetrics & Gynecology

## 2021-10-01 VITALS — BP 122/66 | HR 78 | Ht 65.0 in | Wt 182.0 lb

## 2021-10-01 DIAGNOSIS — N764 Abscess of vulva: Secondary | ICD-10-CM | POA: Diagnosis not present

## 2021-10-01 MED ORDER — ESTRADIOL 0.1 MG/24HR TD PTTW: 1.0000 | MEDICATED_PATCH | TRANSDERMAL | 11 refills | Status: DC

## 2021-10-01 MED ORDER — ESTRADIOL 0.1 MG/24HR TD PTTW: 1.0000 | MEDICATED_PATCH | TRANSDERMAL | 3 refills | Status: DC

## 2021-10-01 MED ORDER — DOXYCYCLINE HYCLATE 100 MG PO TABS: 100.0000 mg | ORAL_TABLET | Freq: Two times a day (BID) | ORAL | 0 refills | Status: DC

## 2021-10-01 NOTE — Progress Notes (Signed)
Chief Complaint  Patient presents with   Gynecologic Exam      61 y.o. G4P0040 No LMP recorded. Patient has had a hysterectomy. The current method of family planning is status post hysterectomy.  Outpatient Encounter Medications as of 10/01/2021  Medication Sig Note   ALPRAZolam (XANAX) 1 MG tablet Take 1 tablet by mouth every 6 (six) hours as needed.    Aspirin-Acetaminophen-Caffeine (EXCEDRIN MIGRAINE PO) Take by mouth.    budesonide-formoterol (SYMBICORT) 160-4.5 MCG/ACT inhaler Inhale 2 puffs into the lungs 2 (two) times daily.    divalproex (DEPAKOTE) 500 MG DR tablet Take 3 tablets (1,500 mg total) by mouth at bedtime. (Patient taking differently: Take 1,000 mg by mouth at bedtime.) 12/05/2018: Reports only taking 1,000 at bedtime. Smith,Kimberly N, RN 12/05/18 12:18 PM       doxycycline (VIBRA-TABS) 100 MG tablet Take 1 tablet (100 mg total) by mouth 2 (two) times daily.    HYDROcodone-acetaminophen (NORCO) 10-325 MG tablet Take 1 tablet by mouth every 6 (six) hours as needed (pain).    methadone (DOLOPHINE) 10 MG tablet Take 1 tablet (10 mg total) by mouth every 8 (eight) hours.    mirabegron ER (MYRBETRIQ) 50 MG TB24 tablet Take 1 tablet (50 mg total) by mouth daily.    Multiple Vitamin (MULTIVITAMIN) tablet Take 1 tablet by mouth daily.    naloxegol oxalate (MOVANTIK) 25 MG TABS tablet Take 1 tablet (25 mg total) by mouth daily.    Omega-3 Fatty Acids (FISH OIL) 1000 MG CAPS Take by mouth daily.     pantoprazole (PROTONIX) 40 MG tablet Take 1 tablet (40 mg total) by mouth 2 (two) times daily before a meal.    polyethylene glycol (MIRALAX / GLYCOLAX) 17 g packet Take 17 g by mouth.    Promethazine HCl (PHENERGAN PO) Take by mouth.    sertraline (ZOLOFT) 50 MG tablet Take 50 mg by mouth daily.    [DISCONTINUED] estradiol (VIVELLE-DOT) 0.1 MG/24HR patch Place 1 patch (0.1 mg total) onto the skin 2 (two) times a week.    estradiol (VIVELLE-DOT) 0.1 MG/24HR patch Place 1 patch  (0.1 mg total) onto the skin 2 (two) times a week.    fluconazole (DIFLUCAN) 150 MG tablet TAKE ONE TABLET BY MOUTH TODAY AND REPEAT IN 3 DAYS    zolpidem (AMBIEN) 10 MG tablet TK 1 T PO QD HS (Patient not taking: Reported on 08/10/2021)    [DISCONTINUED] estradiol (VIVELLE-DOT) 0.1 MG/24HR patch Place 1 patch (0.1 mg total) onto the skin 2 (two) times a week.    No facility-administered encounter medications on file as of 10/01/2021.    Subjective Pt with tender red infected area right vulva for 1 week Used neosporin + witch hazel with some improvement Past Medical History:  Diagnosis Date   Anxiety    Arthritis    hip and wrist   Brain cancer ([place])    2009 glioblastoma (stage 4) Dr. Briant Cedar [facility]   [facility])    Degenerative disc disease at L5-S1 level    GERD (gastroesophageal reflux disease)    Seizures ([place])    brain cancer    Past Surgical History:  Procedure Laterality Date   ABDOMINAL HYSTERECTOMY     APPENDECTOMY     BIOPSY  04/24/2018   Procedure: BIOPSY;  Surgeon: Daneil Dolin, MD;  [facility];  Service: Endoscopy;;  gastric   BRAIN SURGERY X2     CHOLECYSTECTOMY     ESOPHAGOGASTRODUODENOSCOPY (  EGD) WITH PROPOFOL N/A 04/24/2018   Dr. Gala Romney: Normal esophagus.  Status post dilation for history of dysphagia.  Diffuse moderate mucosal changes in the entire stomach consistent with portal hypertensive gastropathy.  Biopsy with chronic inactive gastritis, no H. pylori.   MALONEY DILATION N/A 04/24/2018   Procedure: Venia Minks DILATION;  Surgeon: Daneil Dolin, MD;  [facility];  Service: Endoscopy;  Laterality: N/A;    OB History     Gravida  4   Para  0   Term      Preterm      AB  4   Living  0      SAB  4   IAB      Ectopic      Multiple      Live Births              Allergies  Allergen Reactions   Mixed Ragweed Other (See Comments)    '... ragweed ( itchy & burning eyes ; runny nose ; headache)'   Pollen Extract  Other (See Comments)    'Pollen...' '...( itchy & burning eyes ; runny nose ; headache)'    Social History   Socioeconomic History   Marital status: Married    Spouse name: Not on file   Number of children: Not on file   Years of education: Not on file   Highest education level: Not on file  Occupational History   Not on file  Tobacco Use   Smoking status: Every Day    Packs/day: 1.00    Years: 23.00    Total pack years: 23.00    Types: Cigarettes   Smokeless tobacco: Never  Vaping Use   Vaping Use: Never used  Substance and Sexual Activity   Alcohol use: Yes    Alcohol/week: 0.0 standard drinks of alcohol    Comment: special occassion   Drug use: No   Sexual activity: Yes    Birth control/protection: Surgical    Comment: hyst  Other Topics Concern   Not on file  Social History Narrative   Not on file   Social Determinants of Health   Financial Resource Strain: Low Risk  (08/10/2021)   Overall Financial Resource Strain (CARDIA)    Difficulty of Paying Living Expenses: Not very hard  Food Insecurity: No Food Insecurity (08/10/2021)   Hunger Vital Sign    Worried About Running Out of Food in the Last Year: Never true    Ran Out of Food in the Last Year: Never true  Transportation Needs: No Transportation Needs (08/10/2021)   PRAPARE - Hydrologist (Medical): No    Lack of Transportation (Non-Medical): No  Physical Activity: Insufficiently Active (08/10/2021)   Exercise Vital Sign    Days of Exercise per Week: 2 days    Minutes of Exercise per Session: 20 min  Stress: Stress Concern Present (08/10/2021)   [place]    Feeling of Stress : Rather much  Social Connections: Socially Integrated (08/10/2021)   Social Connection and Isolation Panel [NHANES]    Frequency of Communication with Friends and Family: Once a week    Frequency of Social Gatherings with Friends and Family:  Twice a week    Attends Religious Services: More than 4 times per year    Active Member of [employer] or Organizations: Yes    Attends Archivist Meetings: More than 4 times per year  Marital Status: Married    Family History  Adopted: Yes  Problem Relation Age of Onset   Heart disease Father    Hypotension Father    Diabetes Father    Allergic rhinitis Father    Hyperlipidemia Maternal Grandfather    Hypertension Maternal Grandfather    Heart disease Maternal Grandfather    Allergic rhinitis Maternal Grandfather    Allergic rhinitis Mother    Allergic rhinitis Maternal Grandmother    Lymphoma Maternal Grandmother    Asthma Brother    Allergic rhinitis Brother    Asthma Brother    Allergic rhinitis Brother    COPD Brother    Allergic rhinitis Brother    Angioedema Neg Hx    Atopy Neg Hx    Eczema Neg Hx    Immunodeficiency Neg Hx    Urticaria Neg Hx    Colon cancer Neg Hx     Medications:       Current Outpatient Medications:    ALPRAZolam (XANAX) 1 MG tablet, Take 1 tablet by mouth every 6 (six) hours as needed., Disp: , Rfl:    Aspirin-Acetaminophen-Caffeine (EXCEDRIN MIGRAINE PO), Take by mouth., Disp: , Rfl:    budesonide-formoterol (SYMBICORT) 160-4.5 MCG/ACT inhaler, Inhale 2 puffs into the lungs 2 (two) times daily., Disp: 1 Inhaler, Rfl: 11   divalproex (DEPAKOTE) 500 MG DR tablet, Take 3 tablets (1,500 mg total) by mouth at bedtime. (Patient taking differently: Take 1,000 mg by mouth at bedtime.), Disp: 90 tablet, Rfl: 3   doxycycline (VIBRA-TABS) 100 MG tablet, Take 1 tablet (100 mg total) by mouth 2 (two) times daily., Disp: 20 tablet, Rfl: 0   HYDROcodone-acetaminophen (NORCO) 10-325 MG tablet, Take 1 tablet by mouth every 6 (six) hours as needed (pain)., Disp: 120 tablet, Rfl: 0   methadone (DOLOPHINE) 10 MG tablet, Take 1 tablet (10 mg total) by mouth every 8 (eight) hours., Disp: 90 tablet, Rfl: 0   mirabegron ER (MYRBETRIQ) 50 MG TB24 tablet, Take 1  tablet (50 mg total) by mouth daily., Disp: 30 tablet, Rfl: 11   Multiple Vitamin (MULTIVITAMIN) tablet, Take 1 tablet by mouth daily., Disp: , Rfl:    naloxegol oxalate (MOVANTIK) 25 MG TABS tablet, Take 1 tablet (25 mg total) by mouth daily., Disp: 90 tablet, Rfl: 3   Omega-3 Fatty Acids (FISH OIL) 1000 MG CAPS, Take by mouth daily. , Disp: , Rfl:    pantoprazole (PROTONIX) 40 MG tablet, Take 1 tablet (40 mg total) by mouth 2 (two) times daily before a meal., Disp: 60 tablet, Rfl: 3   polyethylene glycol (MIRALAX / GLYCOLAX) 17 g packet, Take 17 g by mouth., Disp: , Rfl:    Promethazine HCl (PHENERGAN PO), Take by mouth., Disp: , Rfl:    sertraline (ZOLOFT) 50 MG tablet, Take 50 mg by mouth daily., Disp: , Rfl:    estradiol (VIVELLE-DOT) 0.1 MG/24HR patch, Place 1 patch (0.1 mg total) onto the skin 2 (two) times a week., Disp: 8 patch, Rfl: 11   fluconazole (DIFLUCAN) 150 MG tablet, TAKE ONE TABLET BY MOUTH TODAY AND REPEAT IN 3 DAYS, Disp: 2 tablet, Rfl: 0   zolpidem (AMBIEN) 10 MG tablet, TK 1 T PO QD HS (Patient not taking: Reported on 08/10/2021), Disp: , Rfl: 0  Objective Blood pressure 122/66, pulse 78, height '5\' 5"'$  (1.651 m), weight 182 lb (82.6 kg).  Boil right vulva is present, small  Pertinent ROS No burning with urination, frequency or urgency No nausea, vomiting or diarrhea Nor  fever chills or other constitutional symptoms   Labs or studies     Impression + Management Plan: Diagnoses this Encounter::   ICD-10-CM   1. Boil of vulva, right  N76.4    Rx: doxycycline 100 mg BID x 10d        Medications prescribed during  this encounter: Meds ordered this encounter  Medications   doxycycline (VIBRA-TABS) 100 MG tablet    Sig: Take 1 tablet (100 mg total) by mouth 2 (two) times daily.    Dispense:  20 tablet    Refill:  0   DISCONTD: estradiol (VIVELLE-DOT) 0.1 MG/24HR patch    Sig: Place 1 patch (0.1 mg total) onto the skin 2 (two) times a week.    Dispense:  24  patch    Refill:  3   estradiol (VIVELLE-DOT) 0.1 MG/24HR patch    Sig: Place 1 patch (0.1 mg total) onto the skin 2 (two) times a week.    Dispense:  8 patch    Refill:  11    Labs or Scans Ordered during this encounter: No orders of the defined types were placed in this encounter.     Follow up Return if symptoms worsen or fail to improve.

## 2021-10-02 IMAGING — DX DG FOREARM 2V*R*
3 series · 3 of 3 positions shown · non-contrast
Comparison: None.

CLINICAL DATA: Patient c/o right forearm, wrist, and hand pain. Per
patient tripped this morning and tried to catch herself with hand,
in which all her weight was placed on that arm

EXAM:
RIGHT FOREARM - 2 VIEW; RIGHT HAND - COMPLETE 3+ VIEW

[forearm ap]
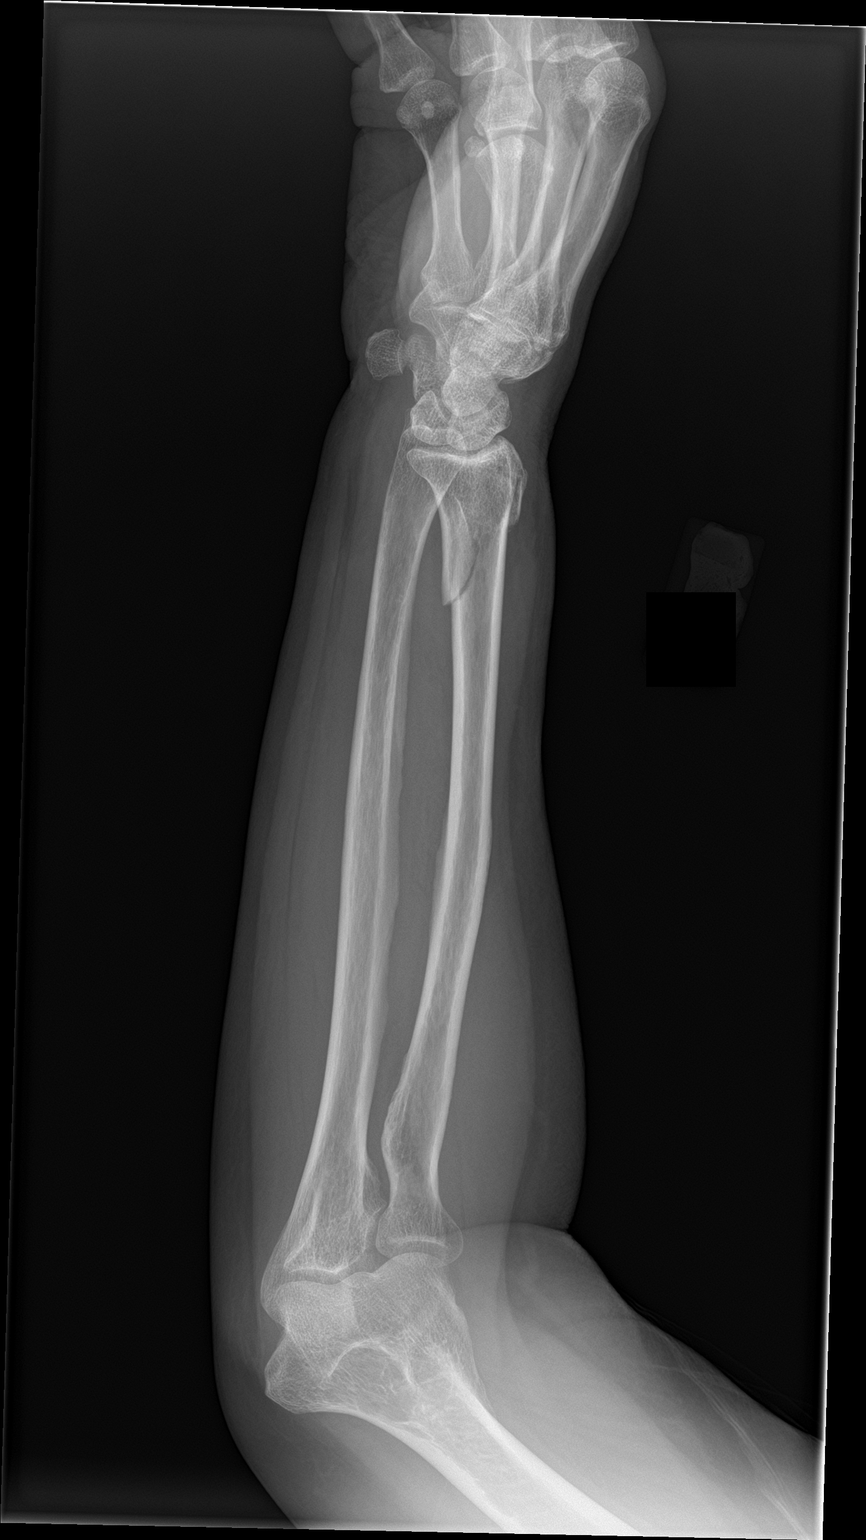

[forearm lat (1 of 2)]
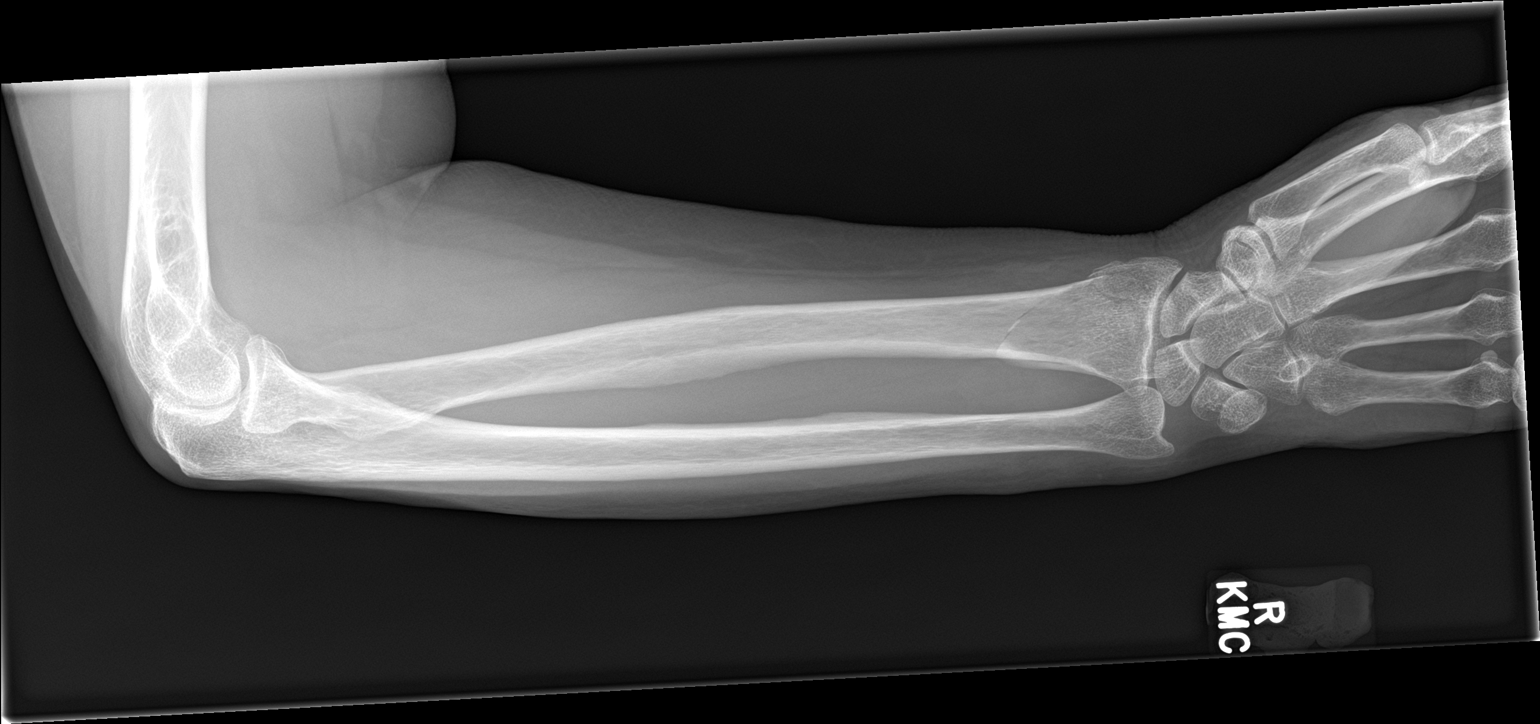

[forearm lat (2 of 2)]
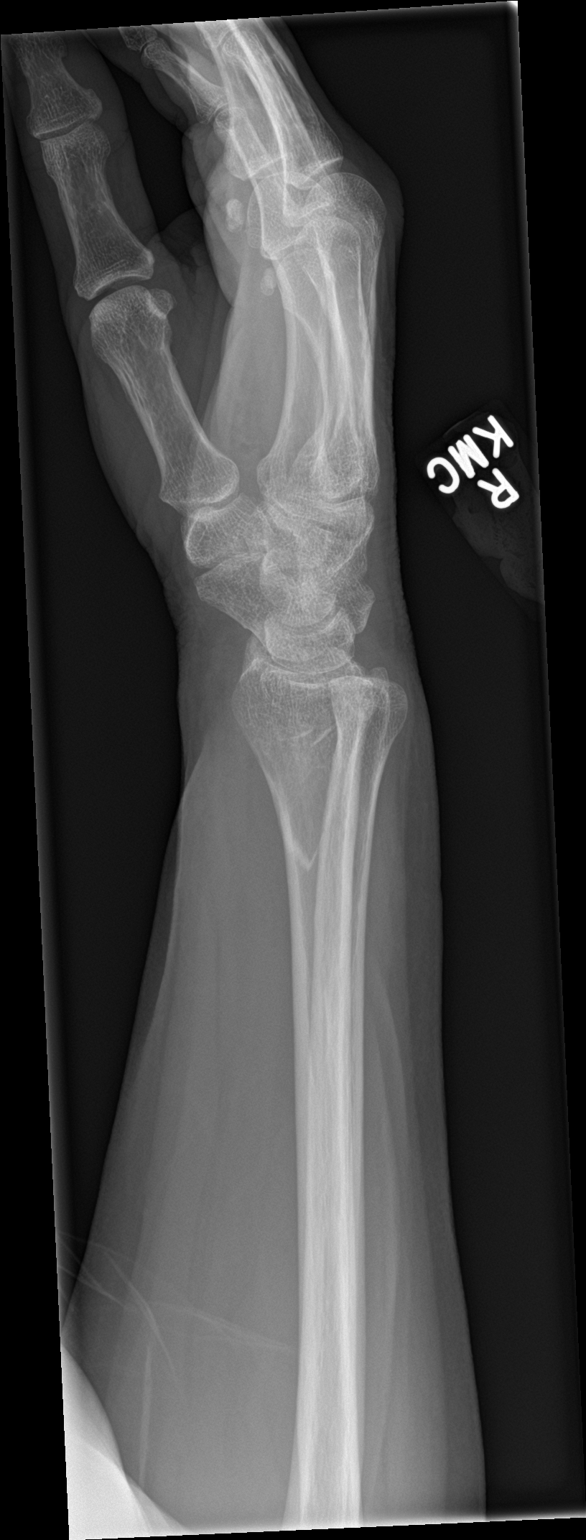

[3 of 3 positions shown; findings below may reference images not displayed]

FINDINGS: Right forearm: There is a mildly displaced oblique fracture at the
head of the radius with possible intra-articular extension. No
evidence of dislocation. No additional fracture identified in the
radius or ulna. There is regional soft tissue swelling.

Right hand: No acute fracture or dislocation in the right hand. Mild
scattered degenerative changes. No focal bony lesion. Regional soft
tissues are unremarkable.
IMPRESSION: 1. Mildly displaced oblique fracture at the right radial head with
possible intra-articular extension.
2. No acute fracture or dislocation of the right hand.

## 2021-10-08 ENCOUNTER — Encounter: Payer: Medicare HMO | Attending: Physical Medicine & Rehabilitation | Admitting: Physical Medicine & Rehabilitation

## 2021-10-12 ENCOUNTER — Other Ambulatory Visit: Payer: Self-pay | Admitting: Obstetrics & Gynecology

## 2021-11-04 ENCOUNTER — Telehealth: Payer: Self-pay | Admitting: *Deleted

## 2021-11-04 NOTE — Telephone Encounter (Signed)
Pt pays $45.00 a month for Myrbetriq. There are 2 alternatives that would be $0 copay: Oxybutynin Chloride ER, Oxybutynin Chloride or Solifenacin. Please advise. Thanks! [place]

## 2021-11-06 ENCOUNTER — Other Ambulatory Visit (HOSPITAL_COMMUNITY): Payer: Self-pay | Admitting: Obstetrics & Gynecology

## 2021-11-06 DIAGNOSIS — Z1231 Encounter for screening mammogram for malignant neoplasm of breast: Secondary | ICD-10-CM

## 2021-11-06 NOTE — Telephone Encounter (Signed)
Pt aware neither alternative is as good as the Myrbetriq. Dr. Elonda Husky recommends continuing to take the Myrbetriq. Pt voiced understanding. [place]

## 2021-11-12 ENCOUNTER — Ambulatory Visit (HOSPITAL_COMMUNITY)
Admission: RE | Admit: 2021-11-12 | Discharge: 2021-11-12 | Disposition: A | Discharge status: Home / Self Care | Payer: Medicare HMO | Source: Ambulatory Visit | Attending: Obstetrics & Gynecology | Admitting: Obstetrics & Gynecology

## 2021-11-12 DIAGNOSIS — Z1231 Encounter for screening mammogram for malignant neoplasm of breast: Secondary | ICD-10-CM | POA: Diagnosis present

## 2021-11-24 ENCOUNTER — Ambulatory Visit: Payer: Medicare HMO | Admitting: Internal Medicine

## 2021-12-08 ENCOUNTER — Ambulatory Visit: Payer: Medicare HMO | Admitting: Internal Medicine

## 2021-12-10 ENCOUNTER — Encounter: Payer: Medicare HMO | Attending: Physical Medicine & Rehabilitation | Admitting: Physical Medicine & Rehabilitation

## 2021-12-17 ENCOUNTER — Ambulatory Visit: Payer: Medicare HMO | Admitting: Internal Medicine

## 2022-01-11 NOTE — Progress Notes (Deleted)
GI Office Note    Referring Provider: [place] Nation, MD Primary Care Physician:  Frank[place] Nation, MD  Primary Gastroenterologist:  Chief Complaint   No chief complaint on file.   History of Present Illness   Jill Harrell is a 61 y.o. female presenting today     Last seen 08/2019.   11/14/19 cx 01/02/20 cx 03/24/20 ns 04/29/20 ns 04/01/21 cx 07/22/21 ns 11/24/21 provider cx 12/08/21 provider cx 12/17/21 provider cx      Medications   Current Outpatient Medications  Medication Sig Dispense Refill   ALPRAZolam (XANAX) 1 MG tablet Take 1 tablet by mouth every 6 (six) hours as needed.     Aspirin-Acetaminophen-Caffeine (EXCEDRIN MIGRAINE PO) Take by mouth.     budesonide-formoterol (SYMBICORT) 160-4.5 MCG/ACT inhaler Inhale 2 puffs into the lungs 2 (two) times daily. 1 Inhaler 11   divalproex (DEPAKOTE) 500 MG DR tablet Take 3 tablets (1,500 mg total) by mouth at bedtime. (Patient taking differently: Take 1,000 mg by mouth at bedtime.) 90 tablet 3   doxycycline (VIBRA-TABS) 100 MG tablet Take 1 tablet (100 mg total) by mouth 2 (two) times daily. 20 tablet 0   estradiol (VIVELLE-DOT) 0.1 MG/24HR patch Place 1 patch (0.1 mg total) onto the skin 2 (two) times a week. 8 patch 11   fluconazole (DIFLUCAN) 150 MG tablet TAKE ONE TABLET BY MOUTH TODAY AND REPEAT IN 3 DAYS 2 tablet 0   HYDROcodone-acetaminophen (NORCO) 10-325 MG tablet Take 1 tablet by mouth every 6 (six) hours as needed (pain). 120 tablet 0   methadone (DOLOPHINE) 10 MG tablet Take 1 tablet (10 mg total) by mouth every 8 (eight) hours. 90 tablet 0   mirabegron ER (MYRBETRIQ) 50 MG TB24 tablet Take 1 tablet (50 mg total) by mouth daily. 30 tablet 11   Multiple Vitamin (MULTIVITAMIN) tablet Take 1 tablet by mouth daily.     naloxegol oxalate (MOVANTIK) 25 MG TABS tablet Take 1 tablet (25 mg total) by mouth daily. 90 tablet 3   Omega-3 Fatty Acids (FISH OIL) 1000 MG CAPS Take by mouth daily.       pantoprazole (PROTONIX) 40 MG tablet Take 1 tablet (40 mg total) by mouth 2 (two) times daily before a meal. 60 tablet 3   polyethylene glycol (MIRALAX / GLYCOLAX) 17 g packet Take 17 g by mouth.     Promethazine HCl (PHENERGAN PO) Take by mouth.     sertraline (ZOLOFT) 50 MG tablet Take 50 mg by mouth daily.     zolpidem (AMBIEN) 10 MG tablet TK 1 T PO QD HS (Patient not taking: Reported on 08/10/2021)  0   No current facility-administered medications for this visit.    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 Briant Cedar [facility]   Cancer [facility])    Degenerative disc disease at L5-S1 level    GERD (gastroesophageal reflux disease)    Seizures ([place])    brain cancer    Past Surgical History   Past Surgical History:  Procedure Laterality Date   ABDOMINAL HYSTERECTOMY     APPENDECTOMY  BIOPSY  04/24/2018   Procedure: BIOPSY;  Surgeon: Daneil Dolin, MD;  [facility];  Service: Endoscopy;;  gastric   BRAIN SURGERY X2     CHOLECYSTECTOMY     ESOPHAGOGASTRODUODENOSCOPY (EGD) WITH PROPOFOL N/A 04/24/2018   Dr. Gala Romney: Normal esophagus.  Status post dilation for history of dysphagia.  Diffuse moderate mucosal changes in the entire stomach consistent with portal hypertensive gastropathy.  Biopsy with chronic inactive gastritis, no H. pylori.   MALONEY DILATION N/A 04/24/2018   Procedure: Venia Minks DILATION;  Surgeon: Daneil Dolin, MD;  [facility];  Service: Endoscopy;  Laterality: N/A;    Past Family History   Family History  Adopted: Yes  Problem Relation Age of Onset   Heart disease Father    Hypotension Father    Diabetes Father    Allergic  rhinitis Father    Hyperlipidemia Maternal Grandfather    Hypertension Maternal Grandfather    Heart disease Maternal Grandfather    Allergic rhinitis Maternal Grandfather    Allergic rhinitis Mother    Allergic rhinitis Maternal Grandmother    Lymphoma Maternal Grandmother    Asthma Brother    Allergic rhinitis Brother    Asthma Brother    Allergic rhinitis Brother    COPD Brother    Allergic rhinitis Brother    Angioedema Neg Hx    Atopy Neg Hx    Eczema Neg Hx    Immunodeficiency Neg Hx    Urticaria Neg Hx    Colon cancer Neg Hx     Past Social History   Social History   Socioeconomic History   Marital status: Married    Spouse name: Not on file   Number of children: Not on file   Years of education: Not on file   Highest education level: Not on file  Occupational History   Not on file  Tobacco Use   Smoking status: Every Day    Packs/day: 1.00    Years: 23.00    Total pack years: 23.00    Types: Cigarettes   Smokeless tobacco: Never  Vaping Use   Vaping Use: Never used  Substance and Sexual Activity   Alcohol use: Yes    Alcohol/week: 0.0 standard drinks of alcohol    Comment: special occassion   Drug use: No   Sexual activity: Yes    Birth control/protection: Surgical    Comment: hyst  Other Topics Concern   Not on file  Social History Narrative   Not on file   Social Determinants of Health   Financial Resource Strain: Low Risk  (08/10/2021)   Overall Financial Resource Strain (CARDIA)    Difficulty of Paying Living Expenses: Not very hard  Food Insecurity: No Food Insecurity (08/10/2021)   Hunger Vital Sign    Worried About Running Out of Food in the Last Year: Never true    Ran Out of Food in the Last Year: Never true  Transportation Needs: No Transportation Needs (08/10/2021)   PRAPARE - Hydrologist (Medical): No    Lack of Transportation (Non-Medical): No  Physical Activity: Insufficiently Active (08/10/2021)    Exercise Vital Sign    Days of Exercise per Week: 2 days    Minutes of Exercise per Session: 20 min  Stress: Stress Concern Present (08/10/2021)   [place]    Feeling of Stress : Rather much  Social Connections: [place] (08/10/2021)  Social Licensed conveyancer [NHANES]    Frequency of Communication with Friends and Family: Once a week    Frequency of Social Gatherings with Friends and Family: Twice a week    Attends Religious Services: More than 4 times per year    Active Member of [employer] or Organizations: Yes    Attends Music therapist: More than 4 times per year    Marital Status: Married  Human resources officer Violence: Not At Risk (08/10/2021)   Humiliation, Afraid, Rape, and Kick questionnaire    Fear of Current or Ex-Partner: No    Emotionally Abused: No    Physically Abused: No    Sexually Abused: No    Review of Systems   General: Negative for anorexia, weight loss, fever, chills, fatigue, weakness. ENT: Negative for hoarseness, difficulty swallowing , nasal congestion. CV: Negative for chest pain, angina, palpitations, dyspnea on exertion, peripheral edema.  Respiratory: Negative for dyspnea at rest, dyspnea on exertion, cough, sputum, wheezing.  GI: See history of present illness. GU:  Negative for dysuria, hematuria, urinary incontinence, urinary frequency, nocturnal urination.  Endo: Negative for unusual weight change.     Physical Exam   There were no vitals taken for this visit.   General: Well-nourished, well-developed in no acute distress.  Eyes: No icterus. Mouth: Oropharyngeal mucosa moist and pink , no lesions erythema or exudate. Lungs: Clear to auscultation bilaterally.  Heart: Regular rate and rhythm, no murmurs rubs or gallops.  Abdomen: Bowel sounds are normal, nontender, nondistended, no hepatosplenomegaly or masses,  no abdominal bruits or hernia , no  rebound or guarding.  Rectal: ***  Extremities: No lower extremity edema. No clubbing or deformities. Neuro: Alert and oriented x 4   Skin: Warm and dry, no jaundice.   Psych: Alert and cooperative, normal mood and affect.  Labs   *** Imaging Studies   No results found.  Assessment       PLAN   ***   Laureen Ochs. Bobby Rumpf, [place], [facility]

## 2022-01-12 ENCOUNTER — Ambulatory Visit: Payer: Medicare HMO | Admitting: Gastroenterology

## 2022-01-28 ENCOUNTER — Telehealth: Payer: Self-pay

## 2022-01-28 NOTE — Telephone Encounter (Signed)
Called and left vm to call me back tomorrow.

## 2022-01-28 NOTE — Telephone Encounter (Signed)
Patient would like for someone to call her. Patient is trying to get her medication moved to mail order

## 2022-01-29 MED ORDER — ESTRADIOL 0.1 MG/24HR TD PTTW: 1.0000 | MEDICATED_PATCH | TRANSDERMAL | 3 refills | Status: DC

## 2022-01-29 MED ORDER — MIRABEGRON ER 50 MG PO TB24: 50.0000 mg | ORAL_TABLET | Freq: Every day | ORAL | 3 refills | Status: DC

## 2022-01-29 NOTE — Addendum Note (Signed)
Addended by: [name] on: 01/29/2022 02:37 PM   Modules accepted: Orders

## 2022-01-29 NOTE — Telephone Encounter (Signed)
Patient called needing generic forms of the Myrbetriq and Estradiol patch sent to [facility] mail order pharmacy.

## 2022-02-18 NOTE — Telephone Encounter (Signed)
complete

## 2022-05-11 ENCOUNTER — Ambulatory Visit: Payer: Medicare HMO | Admitting: Gastroenterology

## 2022-05-13 ENCOUNTER — Encounter: Payer: Medicare HMO | Admitting: Gastroenterology

## 2022-05-18 ENCOUNTER — Encounter: Payer: Medicare HMO | Admitting: Gastroenterology

## 2022-05-18 NOTE — Progress Notes (Deleted)
GI Office Note    Referring Provider: [place] Nation, MD Primary Care Physician:  [place] Nation, MD Primary Gastroenterologist: Cristopher Estimable.Rourk, MD  Date:  05/18/2022  ID:  Jill Harrell, Jill Harrell 1960/10/07, MRN [id number]   Chief Complaint   No chief complaint on file.  History of Present Illness  Jill Harrell is a 62 y.o. female with a history of anxiety, glioblastoma in 2009, degenerative disc disease, GERD, seizures presenting today with complaint of dysphagia.  Negative Cologuard in 2017.  EGD February 2020: -Normal examined esophagus s/p Maloney dilation -Mucosal changes in the stomach of uncertain significance s/p biopsy -Normal duodenum -Biopsies with mild chronic inactive gastritis, negative for H. pylori  Last office visit 09/11/2019.  Previously Amitiza PA was approved through cover my meds however patient reported she changed insurances and her Amitiza would cost too much.  She reported her husband was on Linzess but that was also too expensive.  At this also visit she reported she was doing okay overall but again noted that she could not afford Amitiza.  She states she never received patient assistance forms to complete and her insurance would not cover Linzess or Amitiza at all.  Had been taking MiraLAX and IBguard without good results.  Only 3 times daily dosing of MiraLAX seem to work.  Has also tried stool softeners and Dulcolax.  Only have a bowel movement every 3-5 days with hard stools, straining, and incomplete emptying.  Denies any hematochezia.  Did note abdominal pain that usually improves somewhat after bowel movement.  Also with nausea and vomiting.  Noted to be on chronic pain medication due to terminal glioblastoma.  GERD controlled on Protonix.  Dysphagia improved after previous dilation but reported having repeat problems with her seizure medications due to them being large and unable to be broken.  Today: Dysphagia -   Constipation-    Current  Outpatient Medications  Medication Sig Dispense Refill   ALPRAZolam (XANAX) 1 MG tablet Take 1 tablet by mouth every 6 (six) hours as needed.     Aspirin-Acetaminophen-Caffeine (EXCEDRIN MIGRAINE PO) Take by mouth.     budesonide-formoterol (SYMBICORT) 160-4.5 MCG/ACT inhaler Inhale 2 puffs into the lungs 2 (two) times daily. 1 Inhaler 11   divalproex (DEPAKOTE) 500 MG DR tablet Take 3 tablets (1,500 mg total) by mouth at bedtime. (Patient taking differently: Take 1,000 mg by mouth at bedtime.) 90 tablet 3   doxycycline (VIBRA-TABS) 100 MG tablet Take 1 tablet (100 mg total) by mouth 2 (two) times daily. 20 tablet 0   estradiol (VIVELLE-DOT) 0.1 MG/24HR patch Place 1 patch (0.1 mg total) onto the skin 2 (two) times a week. 24 patch 3   fluconazole (DIFLUCAN) 150 MG tablet TAKE ONE TABLET BY MOUTH TODAY AND REPEAT IN 3 DAYS 2 tablet 0   HYDROcodone-acetaminophen (NORCO) 10-325 MG tablet Take 1 tablet by mouth every 6 (six) hours as needed (pain). 120 tablet 0   methadone (DOLOPHINE) 10 MG tablet Take 1 tablet (10 mg total) by mouth every 8 (eight) hours. 90 tablet 0   mirabegron ER (MYRBETRIQ) 50 MG TB24 tablet Take 1 tablet (50 mg total) by mouth daily. 90 tablet 3   Multiple Vitamin (MULTIVITAMIN) tablet Take 1 tablet by mouth daily.     naloxegol oxalate (MOVANTIK) 25 MG TABS tablet Take 1 tablet (25 mg total) by mouth daily. 90 tablet 3   Omega-3 Fatty Acids (FISH OIL) 1000 MG CAPS Take by mouth daily.  pantoprazole (PROTONIX) 40 MG tablet Take 1 tablet (40 mg total) by mouth 2 (two) times daily before a meal. 60 tablet 3   polyethylene glycol (MIRALAX / GLYCOLAX) 17 g packet Take 17 g by mouth.     Promethazine HCl (PHENERGAN PO) Take by mouth.     sertraline (ZOLOFT) 50 MG tablet Take 50 mg by mouth daily.     zolpidem (AMBIEN) 10 MG tablet TK 1 T PO QD HS (Patient not taking: Reported on 08/10/2021)  0   No current facility-administered medications for this visit.    Past Medical  History:  Diagnosis Date   Anxiety    Arthritis    hip and wrist   Brain cancer ([place])    2009 glioblastoma (stage 4) Dr. Briant Cedar [facility]   Cancer [facility])    Degenerative disc disease at L5-S1 level    GERD (gastroesophageal reflux disease)    Seizures ([place])    brain cancer    Past Surgical History:  Procedure Laterality Date   ABDOMINAL HYSTERECTOMY     APPENDECTOMY     BIOPSY  04/24/2018   Procedure: BIOPSY;  Surgeon: Daneil Dolin, MD;  [facility];  Service: Endoscopy;;  gastric   BRAIN SURGERY X2     CHOLECYSTECTOMY     ESOPHAGOGASTRODUODENOSCOPY (EGD) WITH PROPOFOL N/A 04/24/2018   Dr. Gala Romney: Normal esophagus.  Status post dilation for history of dysphagia.  Diffuse moderate mucosal changes in the entire stomach consistent with portal hypertensive gastropathy.  Biopsy with chronic inactive gastritis, no H. pylori.   MALONEY DILATION N/A 04/24/2018   Procedure: Venia Minks DILATION;  Surgeon: Daneil Dolin, MD;  [facility];  Service: Endoscopy;  Laterality: N/A;    Family History  Adopted: Yes  Problem Relation Age of Onset   Heart disease Father    Hypotension Father    Diabetes Father    Allergic rhinitis Father    Hyperlipidemia Maternal Grandfather    Hypertension Maternal Grandfather    Heart disease Maternal Grandfather    Allergic rhinitis Maternal Grandfather    Allergic rhinitis Mother    Allergic rhinitis Maternal Grandmother    Lymphoma Maternal Grandmother    Asthma Brother    Allergic rhinitis Brother    Asthma Brother    Allergic rhinitis Brother    COPD Brother    Allergic rhinitis Brother    Angioedema Neg Hx    Atopy Neg Hx    Eczema Neg Hx    Immunodeficiency Neg Hx    Urticaria Neg Hx    Colon cancer Neg Hx     Allergies as of 05/18/2022 - Review Complete 10/01/2021  Allergen Reaction Noted   Mixed ragweed Other (See Comments) 04/17/2014   Pollen extract Other (See Comments) 04/17/2014    Social History    Socioeconomic History   Marital status: Married    Spouse name: Not on file   Number of children: Not on file   Years of education: Not on file   Highest education level: Not on file  Occupational History   Not on file  Tobacco Use   Smoking status: Every Day    Packs/day: 1.00    Years: 23.00    Additional pack years: 0.00    Total pack years: 23.00    Types: Cigarettes   Smokeless tobacco: Never  Vaping Use   Vaping Use: Never used  Substance and Sexual Activity   Alcohol use: Yes    Alcohol/week: 0.0 standard  drinks of alcohol    Comment: special occassion   Drug use: No   Sexual activity: Yes    Birth control/protection: Surgical    Comment: hyst  Other Topics Concern   Not on file  Social History Narrative   Not on file   Social Determinants of Health   Financial Resource Strain: Low Risk  (08/10/2021)   Overall Financial Resource Strain (CARDIA)    Difficulty of Paying Living Expenses: Not very hard  Food Insecurity: No Food Insecurity (08/10/2021)   Hunger Vital Sign    Worried About Running Out of Food in the Last Year: Never true    Ran Out of Food in the Last Year: Never true  Transportation Needs: No Transportation Needs (08/10/2021)   PRAPARE - Hydrologist (Medical): No    Lack of Transportation (Non-Medical): No  Physical Activity: Insufficiently Active (08/10/2021)   Exercise Vital Sign    Days of Exercise per Week: 2 days    Minutes of Exercise per Session: 20 min  Stress: Stress Concern Present (08/10/2021)   [place]    Feeling of Stress : Rather much  Social Connections: Socially Integrated (08/10/2021)   Social Connection and Isolation Panel [NHANES]    Frequency of Communication with Friends and Family: Once a week    Frequency of Social Gatherings with Friends and Family: Twice a week    Attends Religious Services: More than 4 times per year     Active Member of [employer] or Organizations: Yes    Attends Music therapist: More than 4 times per year    Marital Status: Married     Review of Systems   Gen: Denies fever, chills, anorexia. Denies fatigue, weakness, weight loss.  CV: Denies chest pain, palpitations, syncope, peripheral edema, and claudication. Resp: Denies dyspnea at rest, cough, wheezing, coughing up blood, and pleurisy. GI: See HPI Derm: Denies rash, itching, dry skin Psych: Denies depression, anxiety, memory loss, confusion. No homicidal or suicidal ideation.  Heme: Denies bruising, bleeding, and enlarged lymph nodes.   Physical Exam   There were no vitals taken for this visit.  General:   Alert and oriented. No distress noted. Pleasant and cooperative.  Head:  Normocephalic and atraumatic. Eyes:  Conjuctiva clear without scleral icterus. Mouth:  Oral mucosa pink and moist. Good dentition. No lesions. Lungs:  Clear to auscultation bilaterally. No wheezes, rales, or rhonchi. No distress.  Heart:  S1, S2 present without murmurs appreciated.  Abdomen:  +BS, soft, non-tender and non-distended. No rebound or guarding. No HSM or masses noted. Rectal: *** Msk:  Symmetrical without gross deformities. Normal posture. Extremities:  Without edema. Neurologic:  Alert and  oriented x4 Psych:  Alert and cooperative. Normal mood and affect.   Assessment  Melanni CELESTER ARMENTEROS is a 62 y.o. female with a history of anxiety, glioblastoma in 2009, degenerative disc disease, GERD, seizures, and constipation presenting today with complaint of dysphagia.  Constipation:  Dysphagia:  PLAN   ***     Venetia Night, MSN, FNP-BC, AGACNP-BC [facility]

## 2022-05-19 ENCOUNTER — Encounter: Payer: Self-pay | Admitting: Gastroenterology

## 2022-05-27 ENCOUNTER — Telehealth: Payer: Self-pay | Admitting: Obstetrics & Gynecology

## 2022-05-27 MED ORDER — SULFAMETHOXAZOLE-TRIMETHOPRIM 800-160 MG PO TABS: 1.0000 | ORAL_TABLET | Freq: Two times a day (BID) | ORAL | 0 refills | Status: DC

## 2022-05-27 NOTE — Telephone Encounter (Signed)
Patient has a boil again and is requesting medication be sent in to pharmacy.  Has tried warm compresses and suggestions from last visit but not getting any better. [facility] in [place].

## 2022-05-27 NOTE — Telephone Encounter (Signed)
Pt is requesting medication for a boil. Please advise

## 2022-06-27 NOTE — Progress Notes (Signed)
GI Office Note    Referring Provider: Donetta Potts, MD Primary Care Physician:  Donetta Potts, MD  Primary Gastroenterologist: Roetta Sessions, MD   Chief Complaint   Chief Complaint  Patient presents with   Gastroesophageal Reflux    Having issues with throwing up acid no matter what she eats. Also having issues with constipation.     History of Present Illness   Jill Harrell is a 62 y.o. female presenting today for further evaluation of GERD, constipation.   Last seen in 08/2019 with dysphagia, GERD, constipation. 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 Negative Cologuard in 2017, previously declined colonoscopies.   Patient has since came off pain medications. She is still having issues with constipation. May have one BM per week. Associated with lower abdominal pain. Takes miralax or metamucil if no BM in 2 days. Does not take daily. No melena, brbpr. Stools [place] 1. She tried a family members amitiza for two weeks and it was very helpful. She is also having a lot of issues with acid reflux. Has severe heartburn within 20 minutes after eating. Associated with regurgitation and vomiting at times. Worse with spicy foods but can happen with other foods too. Has not seen any blood in the emesis. Does have some solid food and large pill dysphagia. Has tried Kaopectate, Pepto, Prilosec OTC.  Taking excedrin migraine only occasion.   EGD 04/2018:Normal esophagus. Status post dilation for history of dysphagia. Diffuse moderate mucosal changes in the entire stomach consistent with portal hypertensive gastropathy. Biopsy with chronic inactive gastritis, no H. pylori.   Abd U/S 2021: IMPRESSION: 1. Increased hepatic echogenicity typical of hepatic steatosis. There is no capsular nodularity to suggest cirrhosis. 2. No splenomegaly. 3. Small simple cysts  in the liver and left kidney. 4. Post cholecystectomy without biliary dilatation  Medications   Current Outpatient Medications  Medication Sig Dispense Refill   ALPRAZolam (XANAX) 1 MG tablet Take 1 tablet by mouth every 6 (six) hours as needed.     Aspirin-Acetaminophen-Caffeine (EXCEDRIN MIGRAINE PO) Take by mouth.     budesonide-formoterol (SYMBICORT) 160-4.5 MCG/ACT inhaler Inhale 2 puffs into the lungs 2 (two) times daily. 1 Inhaler 11   divalproex (DEPAKOTE) 500 MG DR tablet Take 3 tablets (1,500 mg total) by mouth at bedtime. (Patient taking differently: Take 1,000 mg by mouth at bedtime.) 90 tablet 3   estradiol (VIVELLE-DOT) 0.1 MG/24HR patch Place 1 patch (0.1 mg total) onto the skin 2 (two) times a week. 24 patch 3   mirabegron ER (MYRBETRIQ) 50 MG TB24 tablet Take 1 tablet (50 mg total) by mouth daily. 90 tablet 3   Multiple Vitamin (MULTIVITAMIN) tablet Take 1 tablet by mouth daily.     Omega-3 Fatty Acids (FISH OIL) 1000 MG CAPS Take by mouth daily.      polyethylene glycol (MIRALAX / GLYCOLAX) 17 g packet Take 17 g by mouth.     Promethazine HCl (PHENERGAN PO) Take by mouth.     zolpidem (AMBIEN) 10 MG tablet   0   No current facility-administered medications for this visit.    Allergies   Allergies as of 06/28/2022 - Review Complete 06/28/2022  Allergen Reaction Noted   Mixed ragweed Other (See Comments) 04/17/2014   Pollen extract Other (See Comments) 04/17/2014     Review of Systems   General: Negative for anorexia, weight loss, fever, chills,  fatigue, weakness. Eyes: Negative for vision changes.  ENT: Negative for hoarseness,  nasal congestion. CV: Negative for chest pain, angina, palpitations, dyspnea on exertion, peripheral edema.  Respiratory: Negative for dyspnea at rest, dyspnea on exertion, cough, sputum, wheezing.  GI: See history of present illness. GU:  Negative for dysuria, hematuria, urinary incontinence, urinary frequency, nocturnal urination.   MS: chronic joint pain. Osteoporosis.  Derm: Negative for rash or itching.  Neuro: Negative for weakness, abnormal sensation, seizure, frequent headaches, memory loss,  confusion.  Psych: Negative for anxiety, depression, suicidal ideation, hallucinations.  Endo: Negative for unusual weight change.  Heme: Negative for bruising or bleeding. Allergy: Negative for rash or hives.  Physical Exam   BP 103/68 (BP Location: Right Arm, Patient Position: Sitting, Cuff Size: Large)   Pulse 71   Temp 98.1 F (36.7 C) (Oral)   Ht 5\' 4"  (1.626 m)   Wt 183 lb 3.2 oz (83.1 kg)   SpO2 95%   BMI 31.45 kg/m    General: Well-nourished, well-developed in no acute distress.  Head: Normocephalic, atraumatic.   Eyes: Conjunctiva pink, no icterus. Mouth: Oropharyngeal mucosa moist and pink , no lesions erythema or exudate. Dentition in poor repair. Neck: Supple without thyromegaly, masses, or lymphadenopathy.  Lungs: Clear to auscultation bilaterally.  Heart: Regular rate and rhythm, no murmurs rubs or gallops.  Abdomen: Bowel sounds are normal,  nondistended, no hepatosplenomegaly or masses,  no abdominal bruits or hernia, no rebound or guarding.  Mild lower abdominal tenderness.  Rectal: not performed Extremities: No lower extremity edema. No clubbing or deformities.  Neuro: Alert and oriented x 4 , grossly normal neurologically.  Skin: Warm and dry, no rash or jaundice.   Psych: Alert and cooperative, normal mood and affect.  Labs   None available  Imaging Studies   No results found.  Assessment   GERD/dysphagia: reflux symptoms hard to manage, associated with heartburn and vomiting. Now developing some solid food and large pill dysphagia. Last EGD in 2020. Patient wants to try change in medication before considering upper endoscopy.  Constipation: poorly controlled  PLAN   Start pantoprazole 40 mg daily before breakfast and evening meal. Start Amitiza 24 mcg twice daily with food  for constipation. Short course of sucralfate suspension 4 times daily for 10 days.  If not affordable she can use Maalox or Mylanta.  Avoid Pepto-Bismol or other bismuth products for now. Return to the office in 6 weeks.  Call sooner if symptoms are not improving. Would consider updating colon cancer screening if patient agreeable, previously agreed to Cologuard.     Leanna Battles. Melvyn Neth, MHS, PA-C [facility]

## 2022-06-28 ENCOUNTER — Encounter: Payer: Self-pay | Admitting: Gastroenterology

## 2022-06-28 ENCOUNTER — Ambulatory Visit: Payer: Medicare HMO | Admitting: Gastroenterology

## 2022-06-28 VITALS — BP 103/68 | HR 71 | Temp 98.1°F | Ht 64.0 in | Wt 183.2 lb

## 2022-06-28 DIAGNOSIS — R1319 Other dysphagia: Secondary | ICD-10-CM

## 2022-06-28 DIAGNOSIS — K219 Gastro-esophageal reflux disease without esophagitis: Secondary | ICD-10-CM

## 2022-06-28 DIAGNOSIS — K5900 Constipation, unspecified: Secondary | ICD-10-CM | POA: Diagnosis not present

## 2022-06-28 MED ORDER — PANTOPRAZOLE SODIUM 40 MG PO TBEC: 40.0000 mg | DELAYED_RELEASE_TABLET | Freq: Two times a day (BID) | ORAL | 1 refills | Status: DC

## 2022-06-28 MED ORDER — LUBIPROSTONE 24 MCG PO CAPS: 24.0000 ug | ORAL_CAPSULE | Freq: Two times a day (BID) | ORAL | 1 refills | Status: DC

## 2022-06-28 MED ORDER — SUCRALFATE 1 GM/10ML PO SUSP: 1.0000 g | Freq: Four times a day (QID) | ORAL | 1 refills | Status: DC

## 2022-06-28 NOTE — Patient Instructions (Addendum)
Start pantoprazole 40mg  twice daily before breakfast and evening meal for acid reflux. Start amitiza 24 mcg twice daily with food for constipation. Sucralfate suspension sent in to take for 10 days to coat your esophagus. If this is not affordable, you can take maalox or mylanta. Avoid products similar to Pepto that have bismuth in them. Return to the office in six weeks. Call sooner if symptoms aren't improvement.

## 2022-08-12 ENCOUNTER — Ambulatory Visit: Payer: Medicare HMO | Admitting: Obstetrics & Gynecology

## 2022-08-17 ENCOUNTER — Ambulatory Visit: Payer: Medicare HMO | Admitting: Gastroenterology

## 2022-08-17 ENCOUNTER — Encounter: Payer: Self-pay | Admitting: Gastroenterology

## 2022-08-17 VITALS — BP 134/70 | HR 71 | Temp 97.6°F | Ht 64.0 in | Wt 180.8 lb

## 2022-08-17 DIAGNOSIS — K219 Gastro-esophageal reflux disease without esophagitis: Secondary | ICD-10-CM

## 2022-08-17 DIAGNOSIS — Z1211 Encounter for screening for malignant neoplasm of colon: Secondary | ICD-10-CM

## 2022-08-17 DIAGNOSIS — K5900 Constipation, unspecified: Secondary | ICD-10-CM | POA: Diagnosis not present

## 2022-08-17 DIAGNOSIS — R1319 Other dysphagia: Secondary | ICD-10-CM

## 2022-08-17 MED ORDER — PANTOPRAZOLE SODIUM 40 MG PO TBEC: 40.0000 mg | DELAYED_RELEASE_TABLET | Freq: Two times a day (BID) | ORAL | 3 refills | Status: DC

## 2022-08-17 MED ORDER — LUBIPROSTONE 24 MCG PO CAPS: 24.0000 ug | ORAL_CAPSULE | Freq: Two times a day (BID) | ORAL | 3 refills | Status: DC

## 2022-08-17 NOTE — H&P (View-Only) (Signed)
   GI Office Note    Referring Provider: Williams, Gordon F, MD Primary Care Physician:  Williams, Gordon F, MD  Primary Gastroenterologist: Michael Rourk, MD   Chief Complaint   Chief Complaint  Patient presents with   Follow-up    Doing well    History of Present Illness   Jill Harrell is a 62 y.o. female presenting today for follow up. Last seen in April for GERD, constipation.  At time of last office visit she was having difficult to manage reflux symptoms associated with heartburn and vomiting.  Some solid food and large pill dysphagia.  She wanted to try changing medication, holding off on endoscopy.  We started her on pantoprazole 40 mg twice daily.  She was started on Amitiza 24 mcg twice daily for constipation.  Short course of Carafate suspension for 10 days.  Doing well at this time.  No heartburn or abdominal pain since being on pantoprazole.  Solid food dysphagia resolved.  Having some dysphagia to large pills only.  EGD with dilation several years back was helpful.  Bowel movements better on Amitiza.  No melena or rectal bleeding.  Due for colon cancer screening, previously preferred Cologuard.   EGD 04/2018:Normal esophagus. Status post dilation for history of dysphagia. Diffuse moderate mucosal changes in the entire stomach consistent with portal hypertensive gastropathy. Biopsy with chronic inactive gastritis, no H. pylori.    Abd U/S 2021: IMPRESSION: 1. Increased hepatic echogenicity typical of hepatic steatosis. There is no capsular nodularity to suggest cirrhosis. 2. No splenomegaly. 3. Small simple cysts in the liver and left kidney. 4. Post cholecystectomy without biliary dilatation   Medications   Current Outpatient Medications  Medication Sig Dispense Refill   ALPRAZolam (XANAX) 1 MG tablet Take 1 tablet by mouth every 6 (six) hours as needed.     budesonide-formoterol (SYMBICORT) 160-4.5 MCG/ACT inhaler Inhale 2 puffs into the lungs 2 (two)  times daily. 1 Inhaler 11   divalproex (DEPAKOTE) 500 MG DR tablet Take 3 tablets (1,500 mg total) by mouth at bedtime. (Patient taking differently: Take 1,000 mg by mouth at bedtime.) 90 tablet 3   estradiol (VIVELLE-DOT) 0.1 MG/24HR patch Place 1 patch (0.1 mg total) onto the skin 2 (two) times a week. 24 patch 3   HYDROcodone-acetaminophen (NORCO) 10-325 MG tablet 1 tablet as needed Orally every 6 hrs     lubiprostone (AMITIZA) 24 MCG capsule Take 1 capsule (24 mcg total) by mouth 2 (two) times daily with a meal. 180 capsule 1   mirabegron ER (MYRBETRIQ) 50 MG TB24 tablet Take 1 tablet (50 mg total) by mouth daily. 90 tablet 3   Multiple Vitamin (MULTIVITAMIN) tablet Take 1 tablet by mouth daily.     Omega-3 Fatty Acids (FISH OIL) 1000 MG CAPS Take by mouth daily.      pantoprazole (PROTONIX) 40 MG tablet Take 1 tablet (40 mg total) by mouth 2 (two) times daily before a meal. 180 tablet 1   Promethazine HCl (PHENERGAN PO) Take by mouth.     zolpidem (AMBIEN) 10 MG tablet   0   No current facility-administered medications for this visit.    Allergies   Allergies as of 08/17/2022 - Review Complete 08/17/2022  Allergen Reaction Noted   Mixed ragweed Other (See Comments) 04/17/2014   Pollen extract Other (See Comments) 04/17/2014     Review of Systems   General: Negative for anorexia, weight loss, fever, chills, fatigue, weakness. ENT: Negative for hoarseness, nasal congestion.    See HPI CV: Negative for chest pain, angina, palpitations, dyspnea on exertion, peripheral edema.  Respiratory: Negative for dyspnea at rest, dyspnea on exertion, cough, sputum, wheezing.  GI: See history of present illness. GU:  Negative for dysuria, hematuria, urinary incontinence, urinary frequency, nocturnal urination.  Endo: Negative for unusual weight change.     Physical Exam   BP 134/70 (BP Location: Left Arm, Patient Position: Sitting, Cuff Size: Normal)   Pulse 71   Temp 97.6 F (36.4 C) (Oral)    Ht 5' 4" (1.626 m)   Wt 180 lb 12.8 oz (82 kg)   SpO2 96%   BMI 31.03 kg/m    General: Well-nourished, well-developed in no acute distress.  Eyes: No icterus. Mouth: Oropharyngeal mucosa moist and pink  Lungs: Clear to auscultation bilaterally.  Heart: Regular rate and rhythm, no murmurs rubs or gallops.  Abdomen: Bowel sounds are normal, nontender, nondistended, no hepatosplenomegaly or masses, no abdominal bruits or hernia , no rebound or guarding.  Rectal: not performed Extremities: No lower extremity edema. No clubbing or deformities. Neuro: Alert and oriented x 4   Skin: Warm and dry, no jaundice.   Psych: Alert and cooperative, normal mood and affect.  Labs   No Recent labs Imaging Studies   No results found.  Assessment   GERD/dysphagia: Improved.  Some large pill dysphagia persist.  Offered EGD/EGD but patient declines at this time.  Constipation: Doing well at this time.  Colon cancer screening: Cologuard completed in 2017.  Patient has declined colonoscopies but interested in pursuing Cologuard.   PLAN   Cologuard. Decrease pantoprazole to once daily.   Patient currently wants to hold off on endoscopy with esophageal dilation.  She will call if she changes her mind.   Continue Amitiza 24 mcg twice daily with food. Return office visit in 6 months  Samella Lucchetti S. Lounette Sloan, MHS, PA-C [facility]  

## 2022-08-17 NOTE — Progress Notes (Signed)
GI Office Note    Referring Provider: Donetta Potts, MD Primary Care Physician:  Donetta Potts, MD  Primary Gastroenterologist: Roetta Sessions, MD   Chief Complaint   Chief Complaint  Patient presents with   Follow-up    Doing well    History of Present Illness   Jill Harrell is a 62 y.o. female presenting today for follow up. Last seen in April for GERD, constipation.  At time of last office visit she was having difficult to manage reflux symptoms associated with heartburn and vomiting.  Some solid food and large pill dysphagia.  She wanted to try changing medication, holding off on endoscopy.  We started her on pantoprazole 40 mg twice daily.  She was started on Amitiza 24 mcg twice daily for constipation.  Short course of Carafate suspension for 10 days.  Doing well at this time.  No heartburn or abdominal pain since being on pantoprazole.  Solid food dysphagia resolved.  Having some dysphagia to large pills only.  EGD with dilation several years back was helpful.  Bowel movements better on Amitiza.  No melena or rectal bleeding.  Due for colon cancer screening, previously preferred Cologuard.   EGD 04/2018:Normal esophagus. Status post dilation for history of dysphagia. Diffuse moderate mucosal changes in the entire stomach consistent with portal hypertensive gastropathy. Biopsy with chronic inactive gastritis, no H. pylori.    Abd U/S 2021: IMPRESSION: 1. Increased hepatic echogenicity typical of hepatic steatosis. There is no capsular nodularity to suggest cirrhosis. 2. No splenomegaly. 3. Small simple cysts in the liver and left kidney. 4. Post cholecystectomy without biliary dilatation   Medications   Current Outpatient Medications  Medication Sig Dispense Refill   ALPRAZolam (XANAX) 1 MG tablet Take 1 tablet by mouth every 6 (six) hours as needed.     budesonide-formoterol (SYMBICORT) 160-4.5 MCG/ACT inhaler Inhale 2 puffs into the lungs 2 (two)  times daily. 1 Inhaler 11   divalproex (DEPAKOTE) 500 MG DR tablet Take 3 tablets (1,500 mg total) by mouth at bedtime. (Patient taking differently: Take 1,000 mg by mouth at bedtime.) 90 tablet 3   estradiol (VIVELLE-DOT) 0.1 MG/24HR patch Place 1 patch (0.1 mg total) onto the skin 2 (two) times a week. 24 patch 3   HYDROcodone-acetaminophen (NORCO) 10-325 MG tablet 1 tablet as needed Orally every 6 hrs     lubiprostone (AMITIZA) 24 MCG capsule Take 1 capsule (24 mcg total) by mouth 2 (two) times daily with a meal. 180 capsule 1   mirabegron ER (MYRBETRIQ) 50 MG TB24 tablet Take 1 tablet (50 mg total) by mouth daily. 90 tablet 3   Multiple Vitamin (MULTIVITAMIN) tablet Take 1 tablet by mouth daily.     Omega-3 Fatty Acids (FISH OIL) 1000 MG CAPS Take by mouth daily.      pantoprazole (PROTONIX) 40 MG tablet Take 1 tablet (40 mg total) by mouth 2 (two) times daily before a meal. 180 tablet 1   Promethazine HCl (PHENERGAN PO) Take by mouth.     zolpidem (AMBIEN) 10 MG tablet   0   No current facility-administered medications for this visit.    Allergies   Allergies as of 08/17/2022 - Review Complete 08/17/2022  Allergen Reaction Noted   Mixed ragweed Other (See Comments) 04/17/2014   Pollen extract Other (See Comments) 04/17/2014     Review of Systems   General: Negative for anorexia, weight loss, fever, chills, fatigue, weakness. ENT: Negative for hoarseness, nasal congestion.  See HPI CV: Negative for chest pain, angina, palpitations, dyspnea on exertion, peripheral edema.  Respiratory: Negative for dyspnea at rest, dyspnea on exertion, cough, sputum, wheezing.  GI: See history of present illness. GU:  Negative for dysuria, hematuria, urinary incontinence, urinary frequency, nocturnal urination.  Endo: Negative for unusual weight change.     Physical Exam   BP 134/70 (BP Location: Left Arm, Patient Position: Sitting, Cuff Size: Normal)   Pulse 71   Temp 97.6 F (36.4 C) (Oral)    Ht 5\' 4"  (1.626 m)   Wt 180 lb 12.8 oz (82 kg)   SpO2 96%   BMI 31.03 kg/m    General: Well-nourished, well-developed in no acute distress.  Eyes: No icterus. Mouth: Oropharyngeal mucosa moist and pink  Lungs: Clear to auscultation bilaterally.  Heart: Regular rate and rhythm, no murmurs rubs or gallops.  Abdomen: Bowel sounds are normal, nontender, nondistended, no hepatosplenomegaly or masses, no abdominal bruits or hernia , no rebound or guarding.  Rectal: not performed Extremities: No lower extremity edema. No clubbing or deformities. Neuro: Alert and oriented x 4   Skin: Warm and dry, no jaundice.   Psych: Alert and cooperative, normal mood and affect.  Labs   No Recent labs Imaging Studies   No results found.  Assessment   GERD/dysphagia: Improved.  Some large pill dysphagia persist.  Offered EGD/EGD but patient declines at this time.  Constipation: Doing well at this time.  Colon cancer screening: Cologuard completed in 2017.  Patient has declined colonoscopies but interested in pursuing Cologuard.   PLAN   Cologuard. Decrease pantoprazole to once daily.   Patient currently wants to hold off on endoscopy with esophageal dilation.  She will call if she changes her mind.   Continue Amitiza 24 mcg twice daily with food. Return office visit in 6 months  Leanna Battles. Melvyn Neth, MHS, PA-C [facility]

## 2022-08-17 NOTE — Patient Instructions (Signed)
Continue amitiza twice daily for constipation. Continue pantoprazole 40mg  twice daily for reflux. In three months, try dropping down to once daily if tolerated. Cologuard testing to be ordered. We will reach out with results once received.  Call if you want to proceed with upper endoscopy to stretch your esophagus.  Return to the office in six months.

## 2022-08-22 ENCOUNTER — Other Ambulatory Visit: Payer: Self-pay | Admitting: Obstetrics & Gynecology

## 2022-08-25 ENCOUNTER — Encounter: Payer: Self-pay | Admitting: *Deleted

## 2022-08-25 ENCOUNTER — Telehealth: Payer: Self-pay

## 2022-08-25 NOTE — Telephone Encounter (Signed)
Ok to schedule EGD/ED with Rourk. ASA 3. Dx: dysphagia.

## 2022-08-25 NOTE — Telephone Encounter (Signed)
Pt called stating that she got choked on mashed potatoes last night and has decided that she would like to go ahead and be scheduled to have her throat stretched.

## 2022-08-25 NOTE — Telephone Encounter (Signed)
Pt has been scheduled for 09/03/22. Instructions sent via MyChart

## 2022-08-26 ENCOUNTER — Telehealth: Payer: Self-pay | Admitting: *Deleted

## 2022-08-26 NOTE — Telephone Encounter (Signed)
[facility]   Pre-op date : 08/30/22, Monday at 9:30 am

## 2022-08-26 NOTE — Telephone Encounter (Signed)
LMTRC

## 2022-08-27 NOTE — Telephone Encounter (Signed)
Pt has been informed of pre-op date of 08/30/22 at 9:30 am at [facility].

## 2022-08-30 ENCOUNTER — Inpatient Hospital Stay (HOSPITAL_COMMUNITY)
Admission: RE | Admit: 2022-08-30 | Discharge: 2022-08-30 | Disposition: A | Discharge status: Home / Self Care | Payer: Medicare HMO | Source: Ambulatory Visit

## 2022-08-30 ENCOUNTER — Encounter (HOSPITAL_COMMUNITY): Payer: Self-pay

## 2022-08-30 NOTE — Pre-Procedure Instructions (Signed)
Attempted pre-op phonecall. Left VM for her to call us back. 

## 2022-08-31 NOTE — Telephone Encounter (Signed)
[employer] PA:  Approved Authorization #[id number]  Tracking [id number] Dates of service 09/03/2022 - 12/03/2022

## 2022-09-03 ENCOUNTER — Ambulatory Visit (HOSPITAL_COMMUNITY)
Admission: RE | Admit: 2022-09-03 | Discharge: 2022-09-03 | Disposition: A | Discharge status: Home / Self Care | Payer: Medicare HMO | Source: Ambulatory Visit | Attending: Internal Medicine | Admitting: Internal Medicine

## 2022-09-03 ENCOUNTER — Encounter (HOSPITAL_COMMUNITY)
Admission: RE | Disposition: A | Discharge status: Home / Self Care | Payer: Self-pay | Source: Ambulatory Visit | Attending: Internal Medicine

## 2022-09-03 ENCOUNTER — Encounter (HOSPITAL_COMMUNITY): Payer: Self-pay | Admitting: Internal Medicine

## 2022-09-03 ENCOUNTER — Ambulatory Visit (HOSPITAL_COMMUNITY): Payer: Medicare HMO | Admitting: Certified Registered Nurse Anesthetist

## 2022-09-03 DIAGNOSIS — R1310 Dysphagia, unspecified: Secondary | ICD-10-CM | POA: Diagnosis not present

## 2022-09-03 DIAGNOSIS — K5900 Constipation, unspecified: Secondary | ICD-10-CM | POA: Insufficient documentation

## 2022-09-03 DIAGNOSIS — K319 Disease of stomach and duodenum, unspecified: Secondary | ICD-10-CM | POA: Diagnosis not present

## 2022-09-03 DIAGNOSIS — Z85841 Personal history of malignant neoplasm of brain: Secondary | ICD-10-CM | POA: Diagnosis not present

## 2022-09-03 DIAGNOSIS — K259 Gastric ulcer, unspecified as acute or chronic, without hemorrhage or perforation: Secondary | ICD-10-CM | POA: Diagnosis not present

## 2022-09-03 DIAGNOSIS — Z79899 Other long term (current) drug therapy: Secondary | ICD-10-CM | POA: Diagnosis not present

## 2022-09-03 DIAGNOSIS — Z1211 Encounter for screening for malignant neoplasm of colon: Secondary | ICD-10-CM | POA: Diagnosis not present

## 2022-09-03 DIAGNOSIS — K219 Gastro-esophageal reflux disease without esophagitis: Secondary | ICD-10-CM | POA: Diagnosis not present

## 2022-09-03 DIAGNOSIS — Z7951 Long term (current) use of inhaled steroids: Secondary | ICD-10-CM | POA: Insufficient documentation

## 2022-09-03 DIAGNOSIS — F419 Anxiety disorder, unspecified: Secondary | ICD-10-CM

## 2022-09-03 DIAGNOSIS — F17200 Nicotine dependence, unspecified, uncomplicated: Secondary | ICD-10-CM | POA: Insufficient documentation

## 2022-09-03 HISTORY — PX: MALONEY DILATION: SHX5535

## 2022-09-03 HISTORY — PX: BIOPSY: SHX5522

## 2022-09-03 HISTORY — PX: ESOPHAGOGASTRODUODENOSCOPY (EGD) WITH PROPOFOL: SHX5813

## 2022-09-03 SURGERY — ESOPHAGOGASTRODUODENOSCOPY (EGD) WITH PROPOFOL
Anesthesia: General

## 2022-09-03 MED ORDER — LACTATED RINGERS IV SOLN: INTRAVENOUS | Status: DC | PRN

## 2022-09-03 MED ORDER — PROPOFOL 500 MG/50ML IV EMUL
INTRAVENOUS | Status: DC | PRN
  Administered 2022-09-03: 180 ug/kg/min via INTRAVENOUS

## 2022-09-03 MED ORDER — PROPOFOL 10 MG/ML IV BOLUS
INTRAVENOUS | Status: DC | PRN
  Administered 2022-09-03: 80 mg via INTRAVENOUS

## 2022-09-03 NOTE — Op Note (Signed)
[facility] Patient Name: Jill Harrell Procedure Date: 09/03/2022 1:58 PM MRN: [id number] Date of Birth: 1961-02-04 Attending MD: Gennette Pac , MD, [id number] CSN: [id number] Age: 62 Admit Type: Outpatient Procedure:                Upper GI endoscopy Indications:              Dysphagia Providers:                Gennette Pac, MD, Sheran Fava, Dyann Ruddle Referring MD:              Medicines:                Propofol per Anesthesia Complications:            No immediate complications. Estimated Blood Loss:     Estimated blood loss was minimal. Procedure:                Pre-Anesthesia Assessment:                           - Prior to the procedure, a History and Physical                            was performed, and patient medications and                            allergies were reviewed. The patient's tolerance of                            previous anesthesia was also reviewed. The risks                            and benefits of the procedure and the sedation                            options and risks were discussed with the patient.                            All questions were answered, and informed consent                            was obtained. Prior Anticoagulants: The patient has                            taken no anticoagulant or antiplatelet agents. ASA                            Grade Assessment: III - A patient with severe                            systemic disease. After reviewing the risks and  benefits, the patient was deemed in satisfactory                            condition to undergo the procedure.                           After obtaining informed consent, the endoscope was                            passed under direct vision. Throughout the                            procedure, the patient's blood pressure, pulse, and                            oxygen saturations were monitored  continuously. The                            [id number]-[id number] ([id number]) scope was introduced through the                            mouth, and advanced to the second part of duodenum.                            The upper GI endoscopy was accomplished without                            difficulty. The patient tolerated the procedure                            well. Scope In: 2:18:23 PM Scope Out: 2:25:23 PM Total Procedure Duration: 0 hours 7 minutes 0 seconds  Findings:      The examined esophagus was normal. Gastric cavity empty. (3) 3 mm antral       ulcers with surrounding "satellite" erosions. No infiltrating process       observed. Pylorus patent. .      The duodenal bulb and second portion of the duodenum were normal. The       scope was withdrawn. Dilation was performed with a Maloney dilator with       mild resistance at 54 Fr. The scope was withdrawn. Dilation was       performed with a Maloney dilator with mild resistance at 56 Fr. The       dilation site was examined and showed no change. Estimated blood loss:       none. Finally, the small antral ulcers were biopsied. Impression:               - Normal esophagus. Dilated. Small gastric ulcers                            and erosions. Status post biopsy                           - Normal duodenal bulb and second portion of the  duodenum.                           - Moderate Sedation:      Moderate (conscious) sedation was personally administered by an       anesthesia professional. The following parameters were monitored: oxygen       saturation, heart rate, blood pressure, respiratory rate, EKG, adequacy       of pulmonary ventilation, and response to care. Recommendation:           - Patient has a contact number available for                            emergencies. The signs and symptoms of potential                            delayed complications were discussed with the                             patient. Return to normal activities tomorrow.                            Written discharge instructions were provided to the                            patient.                           - Advance diet as tolerated. Continue Protonix 40                            mg twice daily. Follow-up on pathology. Further                            recommendations to follow. Procedure Code(s):        --- Professional ---                           [id number], Esophagogastroduodenoscopy, flexible,                            transoral; diagnostic, including collection of                            specimen(s) by brushing or washing, when performed                            (separate procedure)                           43450, Dilation of esophagus, by unguided sound or                            bougie, single or multiple passes Diagnosis Code(s):        --- Professional ---  R13.10, Dysphagia, unspecified CPT copyright 2022 [employer]. All rights reserved. The codes documented in this report are preliminary and upon coder review may  be revised to meet current compliance requirements. Gerrit Friends. Alicha Raspberry, MD Gennette Pac, MD 09/03/2022 2:30:54 PM This report has been signed electronically. Number of Addenda: 0

## 2022-09-03 NOTE — Interval H&P Note (Signed)
History and Physical Interval Note:  09/03/2022 2:06 PM  Jill Harrell  has presented today for surgery, with the diagnosis of dysphagia.  The various methods of treatment have been discussed with the patient and family. After consideration of risks, benefits and other options for treatment, the patient has consented to  Procedure(s) with comments: ESOPHAGOGASTRODUODENOSCOPY (EGD) WITH PROPOFOL (N/A) - 2:30 pm, asa 3 MALONEY DILATION (N/A) as a surgical intervention.  The patient's history has been reviewed, patient examined, no change in status, stable for surgery.  I have reviewed the patient's chart and labs.  Questions were answered to the patient's satisfaction.     Jill Harrell    no change.  Here for EGD with esophageal dilation is feasible/appropriate per plan. The risks, benefits, limitations, alternatives and imponderables have been reviewed with the patient. Potential for esophageal dilation, biopsy, etc. have also been reviewed.  Questions have been answered. All parties agreeable.

## 2022-09-03 NOTE — Anesthesia Postprocedure Evaluation (Signed)
Anesthesia Post Note  Patient: Jill Harrell  Procedure(s) Performed: ESOPHAGOGASTRODUODENOSCOPY (EGD) WITH PROPOFOL MALONEY DILATION BIOPSY  Patient location during evaluation: Phase II Anesthesia Type: General Level of consciousness: awake Pain management: pain level controlled Vital Signs Assessment: post-procedure vital signs reviewed and stable Respiratory status: spontaneous breathing and respiratory function stable Cardiovascular status: blood pressure returned to baseline and stable Postop Assessment: no headache and no apparent nausea or vomiting Anesthetic complications: no Comments: Late entry   No notable events documented.   Last Vitals:  Vitals:   09/03/22 1326 09/03/22 1427  BP: (!) 128/43 (!) 144/58  Pulse: 61 84  Resp: 12 18  Temp: 36.6 C 36.6 C  SpO2: 96%     Last Pain:  Vitals:   09/03/22 1427  TempSrc:   PainSc: 0-No pain                 Windell Norfolk

## 2022-09-03 NOTE — Transfer of Care (Signed)
Immediate Anesthesia Transfer of Care Note  Patient: Jill Harrell  Procedure(s) Performed: ESOPHAGOGASTRODUODENOSCOPY (EGD) WITH PROPOFOL MALONEY DILATION BIOPSY  Patient Location: Short Stay  Anesthesia Type:General  Level of Consciousness: awake, alert , and oriented  Airway & Oxygen Therapy: Patient Spontanous Breathing  Post-op Assessment: Report given to RN, Post -op Vital signs reviewed and stable, Patient moving all extremities X 4, and Patient able to stick tongue midline  Post vital signs: Reviewed  Last Vitals:  Vitals Value Taken Time  BP 144/58 09/03/22 1427  Temp 36.6 C 09/03/22 1427  Pulse 84 09/03/22 1427  Resp 18 09/03/22 1427  SpO2      Last Pain:  Vitals:   09/03/22 1415  TempSrc:   PainSc: 0-No pain         Complications: No notable events documented.

## 2022-09-03 NOTE — Anesthesia Preprocedure Evaluation (Signed)
Anesthesia Evaluation  Patient identified by MRN, date of birth, ID band Patient awake    Reviewed: Allergy & Precautions, H&P , NPO status , Patient's Chart, lab work & pertinent test results, reviewed documented beta blocker date and time   Airway Mallampati: II  TM Distance: >3 FB Neck ROM: full    Dental no notable dental hx.    Pulmonary neg pulmonary ROS, shortness of breath, Current Smoker   Pulmonary exam normal breath sounds clear to auscultation       Cardiovascular Exercise Tolerance: Good negative cardio ROS  Rhythm:regular Rate:Normal     Neuro/Psych  Headaches, Seizures -,   Anxiety     negative neurological ROS  negative psych ROS   GI/Hepatic negative GI ROS, Neg liver ROS,GERD  ,,  Endo/Other  negative endocrine ROS    Renal/GU negative Renal ROS  negative genitourinary   Musculoskeletal   Abdominal   Peds  Hematology negative hematology ROS (+)   Anesthesia Other Findings   Reproductive/Obstetrics negative OB ROS                             Anesthesia Physical Anesthesia Plan  ASA: 3  Anesthesia Plan: General   Post-op Pain Management:    Induction:   PONV Risk Score and Plan: Propofol infusion  Airway Management Planned:   Additional Equipment:   Intra-op Plan:   Post-operative Plan:   Informed Consent: I have reviewed the patients History and Physical, chart, labs and discussed the procedure including the risks, benefits and alternatives for the proposed anesthesia with the patient or authorized representative who has indicated his/her understanding and acceptance.     Dental Advisory Given  Plan Discussed with: CRNA  Anesthesia Plan Comments:        Anesthesia Quick Evaluation

## 2022-09-03 NOTE — Discharge Instructions (Signed)
EGD Discharge instructions Please read the instructions outlined below and refer to this sheet in the next few weeks. These discharge instructions provide you with general information on caring for yourself after you leave the hospital. Your doctor may also give you specific instructions. While your treatment has been planned according to the most current medical practices available, unavoidable complications occasionally occur. If you have any problems or questions after discharge, please call your doctor. ACTIVITY You may resume your regular activity but move at a slower pace for the next 24 hours.  Take frequent rest periods for the next 24 hours.  Walking will help expel (get rid of) the air and reduce the bloated feeling in your abdomen.  No driving for 24 hours (because of the anesthesia (medicine) used during the test).  You may shower.  Do not sign any important legal documents or operate any machinery for 24 hours (because of the anesthesia used during the test).  NUTRITION Drink plenty of fluids.  You may resume your normal diet.  Begin with a light meal and progress to your normal diet.  Avoid alcoholic beverages for 24 hours or as instructed by your caregiver.  MEDICATIONS You may resume your normal medications unless your caregiver tells you otherwise.  WHAT YOU CAN EXPECT TODAY You may experience abdominal discomfort such as a feeling of fullness or "gas" pains.  FOLLOW-UP Your doctor will discuss the results of your test with you.  SEEK IMMEDIATE MEDICAL ATTENTION IF ANY OF THE FOLLOWING OCCUR: Excessive nausea (feeling sick to your stomach) and/or vomiting.  Severe abdominal pain and distention (swelling).  Trouble swallowing.  Temperature over 101 F (37.8 C).  Rectal bleeding or vomiting of blood.      Your esophagus was nicely stretched today  I found a couple of small ulcers in your stomach.  Biopsies taken  Further recommendations to follow pending review of  pathology report  Continue Protonix 40 mg orally twice daily.  At patient request, I called Nikala Nghiem and [phone] - reviewed findings and recommendations

## 2022-09-07 DIAGNOSIS — Z9221 Personal history of antineoplastic chemotherapy: Secondary | ICD-10-CM | POA: Diagnosis not present

## 2022-09-07 DIAGNOSIS — G894 Chronic pain syndrome: Secondary | ICD-10-CM | POA: Diagnosis not present

## 2022-09-07 DIAGNOSIS — Z79891 Long term (current) use of opiate analgesic: Secondary | ICD-10-CM | POA: Diagnosis not present

## 2022-09-07 DIAGNOSIS — F064 Anxiety disorder due to known physiological condition: Secondary | ICD-10-CM | POA: Diagnosis not present

## 2022-09-07 DIAGNOSIS — G620 Drug-induced polyneuropathy: Secondary | ICD-10-CM | POA: Diagnosis not present

## 2022-09-07 DIAGNOSIS — M545 Low back pain, unspecified: Secondary | ICD-10-CM | POA: Diagnosis not present

## 2022-09-07 LAB — SURGICAL PATHOLOGY

## 2022-09-09 ENCOUNTER — Ambulatory Visit: Payer: Medicare HMO | Admitting: Obstetrics & Gynecology

## 2022-09-09 ENCOUNTER — Encounter (HOSPITAL_COMMUNITY): Payer: Self-pay | Admitting: Internal Medicine

## 2022-09-12 ENCOUNTER — Encounter: Payer: Self-pay | Admitting: Internal Medicine

## 2022-10-15 ENCOUNTER — Ambulatory Visit: Payer: Medicare HMO | Admitting: Obstetrics & Gynecology

## 2022-11-18 ENCOUNTER — Ambulatory Visit: Payer: Medicare HMO | Admitting: Obstetrics & Gynecology

## 2022-12-13 ENCOUNTER — Telehealth: Payer: Self-pay | Admitting: Internal Medicine

## 2022-12-13 ENCOUNTER — Telehealth: Payer: Self-pay | Admitting: Family Medicine

## 2022-12-13 NOTE — Telephone Encounter (Signed)
error 

## 2022-12-13 NOTE — Telephone Encounter (Signed)
Need new patient approval

## 2022-12-14 NOTE — Telephone Encounter (Signed)
Called patient left voicemail to return to return call.

## 2022-12-15 NOTE — Telephone Encounter (Signed)
Pt states has pain management will not need controlled from provider

## 2023-01-04 ENCOUNTER — Ambulatory Visit: Payer: Medicare HMO | Admitting: Obstetrics & Gynecology

## 2023-02-08 ENCOUNTER — Ambulatory Visit: Payer: Medicare HMO | Admitting: Gastroenterology

## 2023-02-08 ENCOUNTER — Encounter: Payer: Self-pay | Admitting: Gastroenterology

## 2023-02-08 NOTE — Progress Notes (Unsigned)
GI Office Note    Referring Provider: Donetta Potts, MD Primary Care Physician:  Gustavus Bryant, [facility]  Primary Gastroenterologist: Roetta Sessions, MD   Chief Complaint   No chief complaint on file.   History of Present Illness   Jill Harrell is a 62 y.o. female presenting today for follow up. Last seen in 07/2022. H/o GERD, constipation. Ordered cologuard at last ov, not completed ***   EGD 08/2022: for dysphagia -normal esophagus s/p dilation -small gastric ulcers and erosions, biopsies with reactive gastropathy, no h.pylori  EGD 04/2018:Normal esophagus. Status post dilation for history of dysphagia. Diffuse moderate mucosal changes in the entire stomach consistent with portal hypertensive gastropathy. Biopsy with chronic inactive gastritis, no H. pylori.    Abd U/S 2021: IMPRESSION: 1. Increased hepatic echogenicity typical of hepatic steatosis. There is no capsular nodularity to suggest cirrhosis. 2. No splenomegaly. 3. Small simple cysts in the liver and left kidney. 4. Post cholecystectomy without biliary dilatation     Medications   Current Outpatient Medications  Medication Sig Dispense Refill   ALPRAZolam (XANAX) 1 MG tablet Take 1 tablet by mouth every 6 (six) hours as needed.     budesonide-formoterol (SYMBICORT) 160-4.5 MCG/ACT inhaler Inhale 2 puffs into the lungs 2 (two) times daily. 1 Inhaler 11   divalproex (DEPAKOTE) 500 MG DR tablet Take 3 tablets (1,500 mg total) by mouth at bedtime. (Patient taking differently: Take 1,000 mg by mouth at bedtime.) 90 tablet 3   estradiol (VIVELLE-DOT) 0.1 MG/24HR patch Place 1 patch (0.1 mg total) onto the skin 2 (two) times a week. 24 patch 3   HYDROcodone-acetaminophen (NORCO) 10-325 MG tablet 1 tablet as needed Orally every 6 hrs     lubiprostone (AMITIZA) 24 MCG capsule Take 1 capsule (24 mcg total) by mouth 2 (two) times daily with a meal. 180 capsule 3   mirabegron ER (MYRBETRIQ) 50 MG TB24  tablet Take 1 tablet by mouth once daily 30 tablet 11   Multiple Vitamin (MULTIVITAMIN) tablet Take 1 tablet by mouth daily.     Omega-3 Fatty Acids (FISH OIL) 1000 MG CAPS Take by mouth daily.      pantoprazole (PROTONIX) 40 MG tablet Take 1 tablet (40 mg total) by mouth 2 (two) times daily before a meal. 180 tablet 3   Promethazine HCl (PHENERGAN PO) Take by mouth.     sertraline (ZOLOFT) 50 MG tablet Take 50 mg by mouth daily.     zolpidem (AMBIEN) 10 MG tablet   0   No current facility-administered medications for this visit.    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 Marcelina Morel [facility]   Cancer [facility])    Degenerative disc disease at L5-S1 level    GERD (gastroesophageal reflux disease)    Seizures (HCC)    brain cancer    Past Surgical History   Past Surgical History:  Procedure Laterality Date   ABDOMINAL HYSTERECTOMY     APPENDECTOMY     BIOPSY  04/24/2018   Procedure: BIOPSY;  Surgeon: Corbin Ade, MD;  [facility];  Service:  Endoscopy;;  gastric   BIOPSY  09/03/2022   Procedure: BIOPSY;  Surgeon: Corbin Ade, MD;  [facility];  Service: Endoscopy;;   BRAIN SURGERY X2     CHOLECYSTECTOMY     ESOPHAGOGASTRODUODENOSCOPY (EGD) WITH PROPOFOL N/A 04/24/2018   Dr. Jena Gauss: Normal esophagus.  Status post dilation for history of dysphagia.  Diffuse moderate mucosal changes in the entire stomach consistent with portal hypertensive gastropathy.  Biopsy with chronic inactive gastritis, no H. pylori.   ESOPHAGOGASTRODUODENOSCOPY (EGD) WITH PROPOFOL N/A 09/03/2022   Procedure: ESOPHAGOGASTRODUODENOSCOPY (EGD) WITH PROPOFOL;  Surgeon: Corbin Ade, MD;  [facility];  Service: Endoscopy;  Laterality: N/A;  2:30 pm, asa 3   EXCISIONAL HEMORRHOIDECTOMY     MALONEY DILATION N/A 04/24/2018   Procedure: MALONEY DILATION;  Surgeon: Corbin Ade, MD;  [facility];  Service: Endoscopy;  Laterality: N/A;   MALONEY DILATION N/A 09/03/2022   Procedure: Elease Hashimoto DILATION;  Surgeon: Corbin Ade, MD;  [facility];  Service: Endoscopy;  Laterality: N/A;    Past Family History   Family History  Adopted: Yes  Problem Relation Age of Onset   Heart disease Father    Hypotension Father    Diabetes Father    Allergic rhinitis Father    Hyperlipidemia Maternal Grandfather    Hypertension Maternal Grandfather    Heart disease Maternal Grandfather    Allergic rhinitis Maternal Grandfather    Allergic rhinitis Mother    Allergic rhinitis Maternal Grandmother    Lymphoma Maternal Grandmother    Asthma Brother    Allergic rhinitis Brother    Asthma Brother    Allergic rhinitis Brother    COPD Brother    Allergic rhinitis Brother    Angioedema Neg Hx    Atopy Neg Hx    Eczema Neg Hx    Immunodeficiency Neg Hx    Urticaria Neg Hx    Colon cancer Neg Hx     Past Social History   Social History   Socioeconomic History   Marital status: Married    Spouse name: Not on file   Number of children: Not on file   Years of education: Not on file   Highest education level: Not on file  Occupational History   Not on file  Tobacco Use   Smoking status: Every Day    Current packs/day: 1.00    Average packs/day: 1 pack/day for 23.0 years (23.0 ttl pk-yrs)    Types: Cigarettes   Smokeless tobacco: Never  Vaping Use   Vaping status: Never Used  Substance and Sexual Activity   Alcohol use: Yes    Alcohol/week: 0.0 standard drinks of alcohol    Comment: special occassion   Drug use: No   Sexual activity: Yes    Birth control/protection: Surgical    Comment: hyst  Other Topics Concern   Not on  file  Social History Narrative   Not on file   Social Determinants of Health   Financial Resource Strain: Low Risk  (08/10/2021)   Overall Financial Resource Strain (CARDIA)    Difficulty of Paying Living Expenses: Not very hard  Food Insecurity: No Food Insecurity (08/10/2021)   Hunger Vital Sign    Worried About Running Out of Food in the Last Year: Never true    Ran Out of Food in the Last Year: Never true  Transportation Needs: No Transportation Needs (08/10/2021)   PRAPARE - Transportation    Lack of  Transportation (Medical): No    Lack of Transportation (Non-Medical): No  Physical Activity: Insufficiently Active (08/10/2021)   Exercise Vital Sign    Days of Exercise per Week: 2 days    Minutes of Exercise per Session: 20 min  Stress: Stress Concern Present (08/10/2021)   [employer] of Occupational Health - Occupational Stress Questionnaire    Feeling of Stress : Rather much  Social Connections: Socially Integrated (08/10/2021)   Social Connection and Isolation Panel [NHANES]    Frequency of Communication with Friends and Family: Once a week    Frequency of Social Gatherings with Friends and Family: Twice a week    Attends Religious Services: More than 4 times per year    Active Member of [employer] or Organizations: Yes    Attends Engineer, structural: More than 4 times per year    Marital Status: Married  Catering manager Violence: Not At Risk (08/10/2021)   Humiliation, Afraid, Rape, and Kick questionnaire    Fear of Current or Ex-Partner: No    Emotionally Abused: No    Physically Abused: No    Sexually Abused: No    Review of Systems   General: Negative for anorexia, weight loss, fever, chills, fatigue, weakness. ENT: Negative for hoarseness, difficulty swallowing , nasal congestion. CV: Negative for chest pain, angina, palpitations, dyspnea on exertion, peripheral edema.  Respiratory: Negative for dyspnea at rest, dyspnea on exertion, cough, sputum,  wheezing.  GI: See history of present illness. GU:  Negative for dysuria, hematuria, urinary incontinence, urinary frequency, nocturnal urination.  Endo: Negative for unusual weight change.     Physical Exam   There were no vitals taken for this visit.   General: Well-nourished, well-developed in no acute distress.  Eyes: No icterus. Mouth: Oropharyngeal mucosa moist and pink , no lesions erythema or exudate. Lungs: Clear to auscultation bilaterally.  Heart: Regular rate and rhythm, no murmurs rubs or gallops.  Abdomen: Bowel sounds are normal, nontender, nondistended, no hepatosplenomegaly or masses,  no abdominal bruits or hernia , no rebound or guarding.  Rectal: ***  Extremities: No lower extremity edema. No clubbing or deformities. Neuro: Alert and oriented x 4   Skin: Warm and dry, no jaundice.   Psych: Alert and cooperative, normal mood and affect.  Labs   *** Imaging Studies   No results found.  Assessment       PLAN   ***   Jill Harrell. Melvyn Neth, MHS, PA-C [facility]

## 2023-02-21 ENCOUNTER — Telehealth: Payer: Self-pay | Admitting: Gastroenterology

## 2023-02-21 MED ORDER — PANTOPRAZOLE SODIUM 40 MG PO TBEC: 40.0000 mg | DELAYED_RELEASE_TABLET | Freq: Two times a day (BID) | ORAL | 3 refills | Status: DC

## 2023-02-21 NOTE — Telephone Encounter (Signed)
Pt is aware of her OV with [facility] on 03/09/2023. She asked for a refill on her pantoprazole and for it to go to [facility] in [place].

## 2023-03-09 ENCOUNTER — Ambulatory Visit: Payer: Medicare HMO | Admitting: Gastroenterology

## 2023-03-14 ENCOUNTER — Ambulatory Visit: Payer: Medicare HMO | Admitting: Gastroenterology

## 2023-03-16 ENCOUNTER — Encounter: Payer: Self-pay | Admitting: *Deleted

## 2023-03-16 ENCOUNTER — Ambulatory Visit: Payer: Medicare HMO | Admitting: Gastroenterology

## 2023-03-16 ENCOUNTER — Encounter: Payer: Self-pay | Admitting: Gastroenterology

## 2023-03-16 VITALS — BP 125/71 | HR 81 | Temp 97.9°F | Ht 64.0 in | Wt 164.8 lb

## 2023-03-16 DIAGNOSIS — R1013 Epigastric pain: Secondary | ICD-10-CM | POA: Insufficient documentation

## 2023-03-16 DIAGNOSIS — R1319 Other dysphagia: Secondary | ICD-10-CM

## 2023-03-16 DIAGNOSIS — R1110 Vomiting, unspecified: Secondary | ICD-10-CM

## 2023-03-16 DIAGNOSIS — R1310 Dysphagia, unspecified: Secondary | ICD-10-CM

## 2023-03-16 DIAGNOSIS — K219 Gastro-esophageal reflux disease without esophagitis: Secondary | ICD-10-CM | POA: Diagnosis not present

## 2023-03-16 DIAGNOSIS — K5900 Constipation, unspecified: Secondary | ICD-10-CM

## 2023-03-16 DIAGNOSIS — R112 Nausea with vomiting, unspecified: Secondary | ICD-10-CM | POA: Insufficient documentation

## 2023-03-16 DIAGNOSIS — R634 Abnormal weight loss: Secondary | ICD-10-CM | POA: Diagnosis not present

## 2023-03-16 DIAGNOSIS — Z8711 Personal history of peptic ulcer disease: Secondary | ICD-10-CM

## 2023-03-16 MED ORDER — FAMOTIDINE 20 MG PO TABS: 20.0000 mg | ORAL_TABLET | Freq: Every day | ORAL | 5 refills | Status: DC

## 2023-03-16 NOTE — Progress Notes (Signed)
 GI Office Note    Referring Provider: Equiptment, [facility] Primary Care Physician:  [facility]  Primary Gastroenterologist: Rheba Cedar, MD   Chief Complaint   Chief Complaint  Patient presents with   Follow-up    Medicines feel like they are getting stuck and dissolves and makes her esophagus burn    History of Present Illness   Jill Harrell is a 63 y.o. female presenting today for follow up. Last seen in 07/2022. H/o GERD, constipation. Did not complete cologuard in 07/2022. Seen by oncology regarding her glioblastoma, stable with plans for next surveillance imaging in two years.   EGD 08/2022: -normal esophagus s/p dilation -small antral ulcers -reactive gastropathy, no h.pylori   EGD 04/2018:Normal esophagus. Status post dilation for history of dysphagia. Diffuse moderate mucosal changes in the entire stomach consistent with portal hypertensive gastropathy. Biopsy with chronic inactive gastritis, no H. pylori.    Abd U/S 2021: IMPRESSION: 1. Increased hepatic echogenicity typical of hepatic steatosis. There is no capsular nodularity to suggest cirrhosis. 2. No splenomegaly. 3. Small simple cysts in the liver and left kidney. 4. Post cholecystectomy without biliary dilatation   Today: Issues swallowing again over the past 6-8 weeks. Initially was doing well after last EGD/ED. Her esophagus appeared normal at that time. She states she has lost 15 pounds in this timeframe. Eating soup. Cannot swallow solid foods. Denies heartburn. Takes pantoprazole  BID and states she has esomeprazole she takes as needed. Not sure where it came from. She is sleeping on a pillow wedge. She is staying up several hours between meals and laying down. She wakes up at 2 am almost nightly with N/V. Complains of epigastric pain during the past two months, constant. BMs regular taking amitiza  once daily. No melena, brbpr. No NSAIDs/ASA powders. 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   Wt Readings from Last 3 Encounters:  03/16/23 164 lb 12.8 oz (74.8 kg)  08/17/22 180 lb 12.8 oz (82 kg)  06/28/22 183 lb 3.2 oz (83.1 kg)    Medications   Current Outpatient Medications  Medication Sig Dispense Refill   ALPRAZolam  (XANAX ) 1 MG tablet Take 1 tablet by mouth every 6 (six) hours as needed.     budesonide -formoterol  (SYMBICORT ) 160-4.5 MCG/ACT inhaler Inhale 2 puffs into the lungs 2 (two) times daily. 1 Inhaler 11   divalproex  (DEPAKOTE ) 500 MG DR tablet Take 3 tablets (1,500 mg total) by mouth at bedtime. (Patient taking differently: Take 1,000 mg by mouth at bedtime.) 90 tablet 3   estradiol  (VIVELLE -DOT) 0.1 MG/24HR patch Place 1 patch (0.1 mg total) onto the skin 2 (two) times a week. 24 patch 3   lubiprostone  (AMITIZA ) 24 MCG capsule Take 1 capsule (24 mcg total) by mouth 2 (two) times daily with a meal. 180 capsule 3   mirabegron  ER (MYRBETRIQ ) 50 MG TB24 tablet Take 1 tablet by mouth once daily 30 tablet 11   Multiple Vitamin (MULTIVITAMIN) tablet Take 1 tablet by mouth daily.     Omega-3 Fatty Acids (FISH OIL) 1000 MG CAPS Take by mouth daily.      pantoprazole  (PROTONIX ) 40 MG tablet Take 1 tablet (40 mg total) by mouth 2 (two) times daily before a meal. 180 tablet 3   Promethazine  HCl (PHENERGAN  PO) Take by mouth.     sertraline  (ZOLOFT ) 50 MG tablet Take 50 mg by mouth daily.     zolpidem  (AMBIEN ) 10 MG  tablet   0   No current facility-administered medications for this visit.    Allergies   Allergies as of 03/16/2023 - Review Complete 03/16/2023  Allergen Reaction Noted   Mixed ragweed Other (See Comments) 04/17/2014   Pollen extract Other (See Comments) 04/17/2014      Review of Systems   General: Negative for anorexia, fever, chills, fatigue, weakness. See hpi ENT: Negative for hoarseness, difficulty swallowing, nasal congestion. CV: Negative for chest pain, angina, palpitations,  dyspnea on exertion, peripheral edema.  Respiratory: Negative for dyspnea at rest, dyspnea on exertion, cough, sputum, wheezing.  GI: See history of present illness. GU:  Negative for dysuria, hematuria, urinary incontinence, urinary frequency, nocturnal urination.  Endo: Negative for unusual weight change.     Physical Exam   BP 125/71 (BP Location: Right Arm, Patient Position: Sitting, Cuff Size: Normal)   Pulse 81   Temp 97.9 F (36.6 C) (Oral)   Ht 5\' 4"  (1.626 m)   Wt 164 lb 12.8 oz (74.8 kg)   SpO2 95%   BMI 28.29 kg/m    General: Well-nourished, well-developed in no acute distress.  Eyes: No icterus. Mouth: Oropharyngeal mucosa moist and pink, no lesions erythema or exudate. Lungs: Clear to auscultation bilaterally.  Heart: Regular rate and rhythm, no murmurs rubs or gallops.  Abdomen: Bowel sounds are normal, nontender, nondistended, no hepatosplenomegaly or masses,  no abdominal bruits or hernia , no rebound or guarding.  Rectal: not performed  Extremities: No lower extremity edema. No clubbing or deformities. Neuro: Alert and oriented x 4   Skin: Warm and dry, no jaundice.   Psych: Alert and cooperative, normal mood and affect.  Labs   None available  Imaging Studies   No results found.  Assessment/Plan:   GERD/dysphagia/epigastric pain/weight loss/history of antral ulcers -recurrent symptoms over the past two months, unable to swallow well, mostly consuming liquids/soft foods. Has lost weight. Nocturnal vomiting and/or regurgitation. Pill dysphagia. Esophageal appeared normal 08/2022. She had antral ulcers at that time. -UGI series to evaluate for esophageal stricture, persistent ulcer, etc -continue pantoprazole  40mg  BID before meals -add pepcid  20mg  at bedtime -antireflux measures  Constipation: -continue amitiza  once to twice daily     Trudie Fuse. [name], MHS, PA-C [facility]

## 2023-03-16 NOTE — Patient Instructions (Addendum)
 Please stop esomeprazole. Continue pantoprazole  40mg  twice daily before breakfast and evening meal. Add famotidine  20mg  at bedtime. Sit up at least 30 minutes after taking medication. Allow 3-4 hours between eating and laying down. Continue lubiprostone  (Amitiza ) once to twice daily for constipation. Upper gi series to be scheduled.

## 2023-03-16 NOTE — Progress Notes (Signed)
 Jill Harrell

## 2023-03-21 ENCOUNTER — Inpatient Hospital Stay
Admission: RE | Admit: 2023-03-21 | Discharge: 2023-03-21 | Disposition: A | Discharge status: Home / Self Care | Payer: Medicare HMO | Source: Ambulatory Visit | Attending: Gastroenterology | Admitting: Gastroenterology

## 2023-04-01 ENCOUNTER — Ambulatory Visit
Admission: EM | Admit: 2023-04-01 | Discharge: 2023-04-01 | Disposition: A | Discharge status: Home / Self Care | Payer: Medicare HMO | Attending: Nurse Practitioner | Admitting: Nurse Practitioner

## 2023-04-01 DIAGNOSIS — J029 Acute pharyngitis, unspecified: Secondary | ICD-10-CM | POA: Diagnosis present

## 2023-04-01 DIAGNOSIS — R059 Cough, unspecified: Secondary | ICD-10-CM | POA: Diagnosis present

## 2023-04-01 DIAGNOSIS — J22 Unspecified acute lower respiratory infection: Secondary | ICD-10-CM | POA: Diagnosis present

## 2023-04-01 LAB — POCT RAPID STREP A (OFFICE): Rapid Strep A Screen: NEGATIVE

## 2023-04-01 MED ORDER — AMOXICILLIN-POT CLAVULANATE 875-125 MG PO TABS: 1.0000 | ORAL_TABLET | Freq: Two times a day (BID) | ORAL | 0 refills | Status: DC

## 2023-04-01 MED ORDER — PROMETHAZINE-DM 6.25-15 MG/5ML PO SYRP: 5.0000 mL | ORAL_SOLUTION | Freq: Four times a day (QID) | ORAL | 0 refills | Status: DC | PRN

## 2023-04-01 MED ORDER — PREDNISONE 20 MG PO TABS: 40.0000 mg | ORAL_TABLET | Freq: Every day | ORAL | 0 refills | Status: AC

## 2023-04-01 NOTE — Discharge Instructions (Addendum)
The rapid strep test was negative.  A throat culture is pending.  You will be contacted if the pending test result is abnormal.  You also have access to the results via MyChart. Take medication as prescribed. Increase fluids and allow for plenty of rest. May take over-the-counter Tylenol as needed for pain, fever, or general discomfort. Warm salt water gargles 3-4 times daily as needed for throat pain or discomfort. Recommend warm tea with honey and lemon, or Chloraseptic throat spray or throat lozenges for throat pain or discomfort. Recommend using a humidifier in your bedroom at nighttime during sleep and sleeping elevated on pillows while cough symptoms persist. The cough may continue after you have completed the medications.  If you are generally feeling well, but continue to have a persistent nagging cough, continue to drink plenty of fluids and use over-the-counter cough medications and cough drops.  If you experience worsening cough, shortness of breath, difficulty eating, or continued fever, please follow-up in this clinic or with your primary care physician for further evaluation. Follow-up as needed.

## 2023-04-01 NOTE — ED Notes (Signed)
Will not send throat culture

## 2023-04-01 NOTE — ED Provider Notes (Signed)
[facility]    CSN: [id number] Arrival date & time: 04/01/23  1726      History   Chief Complaint No chief complaint on file.   HPI Jill Harrell is a 63 y.o. female.   The history is provided by the patient.   Patient presents for complaints of fever, sore throat, cough, and chest congestion that is been present for the past 2 weeks.  Tmax 102, last fever was yesterday.  Patient also endorses wheezing.  Denies headache, ear pain, difficulty breathing, chest pain, abdominal pain, nausea, vomiting, diarrhea, or rash.  Patient states that she is a current smoker, but states that she has not been smoking since her symptoms started.  Reports that she has been taking over-the-counter cough and cold medications with minimal relief.  Of note, patient with history of glioblastoma.  Past Medical History:  Diagnosis Date   Anxiety    Arthritis    hip and wrist   Brain cancer (HCC)    2009 glioblastoma (stage 4) Dr. Marcelina Morel [facility]   Cancer [facility])    Degenerative disc disease at L5-S1 level    GERD (gastroesophageal reflux disease)    Seizures (HCC)    brain cancer    Patient Active Problem List   Diagnosis Date Noted   Sore throat 04/01/2023   N&V (nausea and vomiting) 03/16/2023   Abdominal pain, epigastric 03/16/2023   Loss of weight 03/16/2023   Abnormal findings on esophagogastroduodenoscopy (EGD) 09/11/2019   Medication overuse headache 12/05/2018   GERD (gastroesophageal reflux disease) 06/29/2018   Esophageal dysphagia 02/16/2018   Constipation 01/07/2016   Abdominal pain, periumbilical 01/07/2016   Aortic atherosclerosis (HCC) 12/11/2015   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 07/07/2015   Coughing 07/07/2015   Dyspnea 07/07/2015   Glioblastoma determined by biopsy of brain (HCC) 04/21/2015   Migraines 04/21/2015   Seizures (HCC) 04/10/2015   Symptomatic menopausal or female climacteric states 08/20/2013    Past  Surgical History:  Procedure Laterality Date   ABDOMINAL HYSTERECTOMY     APPENDECTOMY     BIOPSY  04/24/2018   Procedure: BIOPSY;  Surgeon: Corbin Ade, MD;  [facility];  Service: Endoscopy;;  gastric   BIOPSY  09/03/2022   Procedure: BIOPSY;  Surgeon: Corbin Ade, MD;  [facility]: [facility];  Service: Endoscopy;;   BRAIN SURGERY X2     CHOLECYSTECTOMY     ESOPHAGOGASTRODUODENOSCOPY (EGD) WITH PROPOFOL N/A 04/24/2018   Dr. Jena Gauss: Normal esophagus.  Status post dilation for history of dysphagia.  Diffuse moderate mucosal changes in the entire stomach consistent with portal hypertensive gastropathy.  Biopsy with chronic inactive gastritis, no H. pylori.   ESOPHAGOGASTRODUODENOSCOPY (EGD) WITH PROPOFOL N/A 09/03/2022   Procedure: ESOPHAGOGASTRODUODENOSCOPY (EGD) WITH PROPOFOL;  Surgeon: Corbin Ade, MD;  Location: [facility];  Service: Endoscopy;  Laterality: N/A;  2:30 pm, asa 3   EXCISIONAL HEMORRHOIDECTOMY     MALONEY DILATION N/A 04/24/2018   Procedure: MALONEY DILATION;  Surgeon: Corbin Ade, MD;  [facility]: [facility];  Service: Endoscopy;  Laterality: N/A;   MALONEY DILATION N/A 09/03/2022   Procedure: Elease Hashimoto DILATION;  Surgeon: Corbin Ade, MD;  Location: [facility];  Service: Endoscopy;  Laterality: N/A;    OB History     Gravida  4   Para  0   Term      Preterm      AB  4   Living  0  SAB  4   IAB      Ectopic      Multiple      Live Births               Home Medications    Prior to Admission medications   Medication Sig Start Date End Date Taking? Authorizing Provider  amoxicillin-clavulanate (AUGMENTIN) 875-125 MG tablet Take 1 tablet by mouth every 12 (twelve) hours. 04/01/23  Yes Leath-Warren, Sadie Haber, NP  predniSONE (DELTASONE) 20 MG tablet Take 2 tablets (40 mg total) by mouth daily with breakfast for 5 days. 04/01/23 04/06/23 Yes Leath-Warren, Sadie Haber, NP  promethazine-dextromethorphan  (PROMETHAZINE-DM) 6.25-15 MG/5ML syrup Take 5 mLs by mouth 4 (four) times daily as needed. 04/01/23  Yes Leath-Warren, Sadie Haber, NP  ALPRAZolam Prudy Feeler) 1 MG tablet Take 1 tablet by mouth every 6 (six) hours as needed. 11/21/18   [provider]  budesonide-formoterol (SYMBICORT) 160-4.5 MCG/ACT inhaler Inhale 2 puffs into the lungs 2 (two) times daily. 10/15/16   Donita Brooks, MD  Buprenorphine HCl-Naloxone HCl (SUBOXONE) 8-2 MG FILM Place 8 mg of opioid under the tongue daily.    [provider]  divalproex (DEPAKOTE) 500 MG DR tablet Take 3 tablets (1,500 mg total) by mouth at bedtime. Patient taking differently: Take 1,000 mg by mouth at bedtime. 04/21/15   Penland, Novella Olive, MD  estradiol (VIVELLE-DOT) 0.1 MG/24HR patch Place 1 patch (0.1 mg total) onto the skin 2 (two) times a week. 02/01/22   Lazaro Arms, MD  famotidine (PEPCID) 20 MG tablet Take 1 tablet (20 mg total) by mouth at bedtime. 03/16/23   Tiffany Kocher, PA-C  lubiprostone (AMITIZA) 24 MCG capsule Take 1 capsule (24 mcg total) by mouth 2 (two) times daily with a meal. 08/17/22   Tiffany Kocher, PA-C  mirabegron ER (MYRBETRIQ) 50 MG TB24 tablet Take 1 tablet by mouth once daily 08/25/22   Lazaro Arms, MD  Multiple Vitamin (MULTIVITAMIN) tablet Take 1 tablet by mouth daily.    [provider]  Omega-3 Fatty Acids (FISH OIL) 1000 MG CAPS Take by mouth daily.     [provider]  pantoprazole (PROTONIX) 40 MG tablet Take 1 tablet (40 mg total) by mouth 2 (two) times daily before a meal. 02/21/23   Tiffany Kocher, PA-C  Promethazine HCl (PHENERGAN PO) Take by mouth.    [provider]  sertraline (ZOLOFT) 50 MG tablet Take 50 mg by mouth daily.    [provider]  zolpidem (AMBIEN) 10 MG tablet  03/21/17   [provider]    Family History Family History  Adopted: Yes  Problem Relation Age of Onset   Heart disease Father    Hypotension Father    Diabetes  Father    Allergic rhinitis Father    Hyperlipidemia Maternal Grandfather    Hypertension Maternal Grandfather    Heart disease Maternal Grandfather    Allergic rhinitis Maternal Grandfather    Allergic rhinitis Mother    Allergic rhinitis Maternal Grandmother    Lymphoma Maternal Grandmother    Asthma Brother    Allergic rhinitis Brother    Asthma Brother    Allergic rhinitis Brother    COPD Brother    Allergic rhinitis Brother    Angioedema Neg Hx    Atopy Neg Hx    Eczema Neg Hx    Immunodeficiency Neg Hx    Urticaria Neg Hx    Colon cancer Neg Hx  Social History Social History   Tobacco Use   Smoking status: Every Day    Current packs/day: 0.50    Average packs/day: 1 pack/day for 23.1 years (23.0 ttl pk-yrs)    Types: Cigarettes    Start date: 02/29/2000   Smokeless tobacco: Never  Vaping Use   Vaping status: Never Used  Substance Use Topics   Alcohol use: Not Currently    Comment: special occassion   Drug use: No     Allergies   Mixed ragweed and Pollen extract   Review of Systems Review of Systems Per HPI  Physical Exam Triage Vital Signs ED Triage Vitals  Encounter Vitals Group     BP 04/01/23 1818 130/71     Systolic BP Percentile --      Diastolic BP Percentile --      Pulse Rate 04/01/23 1818 71     Resp 04/01/23 1818 16     Temp 04/01/23 1818 98.2 F (36.8 C)     Temp Source 04/01/23 1818 Oral     SpO2 04/01/23 1818 96 %     Weight --      Height --      Head Circumference --      Peak Flow --      Pain Score 04/01/23 1819 5     Pain Loc --      Pain Education --      Exclude from Growth Chart --    No data found.  Updated Vital Signs BP 130/71 (BP Location: Right Arm)   Pulse 71   Temp 98.2 F (36.8 C) (Oral)   Resp 16   SpO2 96%   Visual Acuity Right Eye Distance:   Left Eye Distance:   Bilateral Distance:    Right Eye Near:   Left Eye Near:    Bilateral Near:     Physical Exam Vitals and nursing note  reviewed.  Constitutional:      General: She is not in acute distress.    Appearance: Normal appearance.  HENT:     Head: Normocephalic.     Right Ear: Tympanic membrane, ear canal and external ear normal.     Left Ear: Tympanic membrane, ear canal and external ear normal.     Nose: Congestion present.     Right Turbinates: Enlarged and swollen.     Left Turbinates: Enlarged and swollen.     Right Sinus: No maxillary sinus tenderness or frontal sinus tenderness.     Left Sinus: No maxillary sinus tenderness or frontal sinus tenderness.     Mouth/Throat:     Lips: Pink.     Mouth: Mucous membranes are moist.     Pharynx: Uvula midline. Posterior oropharyngeal erythema and postnasal drip present. No pharyngeal swelling, oropharyngeal exudate or uvula swelling.  Eyes:     Extraocular Movements: Extraocular movements intact.     Conjunctiva/sclera: Conjunctivae normal.     Pupils: Pupils are equal, round, and reactive to light.  Cardiovascular:     Rate and Rhythm: Normal rate and regular rhythm.     Pulses: Normal pulses.     Heart sounds: Normal heart sounds.  Pulmonary:     Effort: Pulmonary effort is normal. No respiratory distress.     Breath sounds: Normal breath sounds. No stridor. No wheezing, rhonchi or rales.  Chest:     Chest wall: No tenderness.  Abdominal:     General: Bowel sounds are normal.     Palpations: Abdomen is  soft.     Tenderness: There is no abdominal tenderness.  Musculoskeletal:     Cervical back: Normal range of motion.  Lymphadenopathy:     Cervical: No cervical adenopathy.  Skin:    General: Skin is warm and dry.  Neurological:     General: No focal deficit present.     Mental Status: She is alert and oriented to person, place, and time.  Psychiatric:        Mood and Affect: Mood normal.        Behavior: Behavior normal.      UC Treatments / Results  Labs (all labs ordered are listed, but only abnormal results are displayed) Labs Reviewed   POCT RAPID STREP A (OFFICE) - Normal  CULTURE, GROUP A STREP [facility])    EKG   Radiology No results found.  Procedures Procedures (including critical care time)  Medications Ordered in UC Medications - No data to display  Initial Impression / Assessment and Plan / UC Course  I have reviewed the triage vital signs and the nursing notes.  Pertinent labs & imaging results that were available during my care of the patient were reviewed by me and considered in my medical decision making (see chart for details).  On exam, lung sounds are clear throughout, room air sats at 96%.  The rapid strep test was negative.  Will forego throat culture as patient is being treated with Augmentin, which will cover any streptococcal etiology if present.  Symptoms consistent with lower respiratory infection.  Will treat empirically with Augmentin 875/125 mg tablets twice daily for the next 7 days, prednisone 40 mg for bronchial inflammation, and Promethazine DM for the cough.  Supportive care recommendations were provided and discussed with the patient to include fluids, rest, over-the-counter analgesics, and sleeping elevated along with using a humidifier during sleep.  Discussed indications with the patient regarding follow-up.  Patient was in agreement with this plan of care and verbalizes understanding.  All questions were answered.  Patient stable for discharge.   Final Clinical Impressions(s) / UC Diagnoses   Final diagnoses:  Sore throat  Lower respiratory infection  Cough, unspecified type     Discharge Instructions      The rapid strep test and COVID/flu test were negative.  A throat culture is pending.  You will be contacted if the pending test result is abnormal.  You also have access to the results via MyChart. Take medication as prescribed. Increase fluids and allow for plenty of rest. May take over-the-counter Tylenol as needed for pain, fever, or general discomfort. Warm salt water  gargles 3-4 times daily as needed for throat pain or discomfort. Recommend warm tea with honey and lemon, or Chloraseptic throat spray or throat lozenges for throat pain or discomfort. Recommend using a humidifier in your bedroom at nighttime during sleep and sleeping elevated on pillows while cough symptoms persist. The cough may continue after you have completed the medications.  If you are generally feeling well, but continue to have a persistent nagging cough, continue to drink plenty of fluids and use over-the-counter cough medications and cough drops.  If you experience worsening cough, shortness of breath, difficulty eating, or continued fever, please follow-up in this clinic or with your primary care physician for further evaluation. Follow-up as needed.     ED Prescriptions     Medication Sig Dispense Auth. Provider   amoxicillin-clavulanate (AUGMENTIN) 875-125 MG tablet Take 1 tablet by mouth every 12 (twelve) hours. 14 tablet Leath-Warren,  Sadie Haber, NP   predniSONE (DELTASONE) 20 MG tablet Take 2 tablets (40 mg total) by mouth daily with breakfast for 5 days. 10 tablet Leath-Warren, Sadie Haber, NP   promethazine-dextromethorphan (PROMETHAZINE-DM) 6.25-15 MG/5ML syrup Take 5 mLs by mouth 4 (four) times daily as needed. 118 mL Leath-Warren, Sadie Haber, NP      PDMP not reviewed this encounter.   Abran Cantor, NP 04/02/23 1108

## 2023-04-01 NOTE — ED Triage Notes (Signed)
Pt reports  she has some congestion, hurts swallow, fever, and a cough x 2 weeks

## 2023-04-04 LAB — CULTURE, GROUP A STREP (THRC)

## 2023-04-06 ENCOUNTER — Other Ambulatory Visit: Payer: Self-pay | Admitting: Obstetrics & Gynecology

## 2023-04-08 ENCOUNTER — Encounter (HOSPITAL_COMMUNITY): Payer: Self-pay | Admitting: Radiology

## 2023-04-08 ENCOUNTER — Ambulatory Visit (HOSPITAL_COMMUNITY)
Admission: RE | Admit: 2023-04-08 | Discharge: 2023-04-08 | Disposition: A | Discharge status: Home / Self Care | Payer: Medicare HMO | Source: Ambulatory Visit | Attending: Gastroenterology | Admitting: Gastroenterology

## 2023-04-08 DIAGNOSIS — R1013 Epigastric pain: Secondary | ICD-10-CM | POA: Diagnosis present

## 2023-04-08 DIAGNOSIS — R112 Nausea with vomiting, unspecified: Secondary | ICD-10-CM | POA: Diagnosis present

## 2023-04-08 DIAGNOSIS — K219 Gastro-esophageal reflux disease without esophagitis: Secondary | ICD-10-CM | POA: Diagnosis present

## 2023-04-08 DIAGNOSIS — R634 Abnormal weight loss: Secondary | ICD-10-CM | POA: Diagnosis present

## 2023-04-08 DIAGNOSIS — R1319 Other dysphagia: Secondary | ICD-10-CM | POA: Insufficient documentation

## 2023-04-25 ENCOUNTER — Ambulatory Visit: Payer: Self-pay | Admitting: Internal Medicine

## 2023-05-03 ENCOUNTER — Ambulatory Visit: Payer: Medicare HMO | Admitting: Internal Medicine

## 2023-05-04 ENCOUNTER — Other Ambulatory Visit (HOSPITAL_COMMUNITY): Payer: Self-pay | Admitting: Obstetrics & Gynecology

## 2023-05-04 DIAGNOSIS — Z1231 Encounter for screening mammogram for malignant neoplasm of breast: Secondary | ICD-10-CM

## 2023-05-05 ENCOUNTER — Ambulatory Visit (HOSPITAL_COMMUNITY)
Admission: RE | Admit: 2023-05-05 | Discharge: 2023-05-05 | Disposition: A | Discharge status: Home / Self Care | Source: Ambulatory Visit | Attending: Obstetrics & Gynecology | Admitting: Obstetrics & Gynecology

## 2023-05-05 ENCOUNTER — Encounter (HOSPITAL_COMMUNITY): Payer: Self-pay

## 2023-05-05 DIAGNOSIS — Z1231 Encounter for screening mammogram for malignant neoplasm of breast: Secondary | ICD-10-CM | POA: Insufficient documentation

## 2023-05-10 ENCOUNTER — Ambulatory Visit: Payer: Medicare HMO | Admitting: Internal Medicine

## 2023-05-10 ENCOUNTER — Encounter: Payer: Self-pay | Admitting: Obstetrics & Gynecology

## 2023-05-10 ENCOUNTER — Ambulatory Visit: Payer: Medicare HMO | Admitting: Obstetrics & Gynecology

## 2023-05-10 VITALS — BP 126/65 | HR 76 | Ht 65.0 in | Wt 163.0 lb

## 2023-05-10 DIAGNOSIS — N3281 Overactive bladder: Secondary | ICD-10-CM

## 2023-05-10 DIAGNOSIS — F418 Other specified anxiety disorders: Secondary | ICD-10-CM

## 2023-05-10 DIAGNOSIS — N951 Menopausal and female climacteric states: Secondary | ICD-10-CM | POA: Diagnosis not present

## 2023-05-10 DIAGNOSIS — G4700 Insomnia, unspecified: Secondary | ICD-10-CM

## 2023-05-10 DIAGNOSIS — Z0000 Encounter for general adult medical examination without abnormal findings: Secondary | ICD-10-CM | POA: Diagnosis not present

## 2023-05-10 DIAGNOSIS — Z01419 Encounter for gynecological examination (general) (routine) without abnormal findings: Secondary | ICD-10-CM

## 2023-05-10 MED ORDER — ALPRAZOLAM 1 MG PO TABS: 1.0000 mg | ORAL_TABLET | Freq: Two times a day (BID) | ORAL | 3 refills | Status: DC | PRN

## 2023-05-10 MED ORDER — TRAZODONE HCL 50 MG PO TABS: 50.0000 mg | ORAL_TABLET | Freq: Every day | ORAL | 3 refills | Status: DC

## 2023-05-10 MED ORDER — ESTRADIOL 0.1 MG/24HR TD PTTW: 1.0000 | MEDICATED_PATCH | TRANSDERMAL | 3 refills | Status: AC

## 2023-05-10 MED ORDER — SERTRALINE HCL 100 MG PO TABS: 100.0000 mg | ORAL_TABLET | Freq: Every day | ORAL | 11 refills | Status: DC

## 2023-05-10 MED ORDER — MIRABEGRON ER 50 MG PO TB24: 50.0000 mg | ORAL_TABLET | Freq: Every day | ORAL | 11 refills | Status: DC

## 2023-05-10 NOTE — Progress Notes (Signed)
 Subjective:     Jill Harrell is a 63 y.o. female here for a routine exam.  No LMP recorded. Patient has had a hysterectomy. [id number] Birth Control Method:  menopausal Menstrual Calendar(currently): amenorrhea  Current complaints: Jill Harrell.   Current acute medical issues:     Recent Gynecologic History No LMP recorded. Patient has had a hysterectomy. Last Pap: 2 years ago,  normal Last mammogram: 2025,  normal  Past Medical History:  Diagnosis Date   Anxiety    Arthritis    hip and wrist   Brain cancer (HCC)    2009 glioblastoma (stage 4) Dr. Marcelina Morel [facility]   Cancer [facility])    Degenerative disc disease at L5-S1 level    GERD (gastroesophageal reflux disease)    Seizures (HCC)    brain cancer    Past Surgical History:  Procedure Laterality Date   ABDOMINAL HYSTERECTOMY     APPENDECTOMY     BIOPSY  04/24/2018   Procedure: BIOPSY;  Surgeon: Corbin Ade, MD;  [facility];  Service: Endoscopy;;  gastric   BIOPSY  09/03/2022   Procedure: BIOPSY;  Surgeon: Corbin Ade, MD;  [facility];  Service: Endoscopy;;   BRAIN SURGERY X2     CHOLECYSTECTOMY     ESOPHAGOGASTRODUODENOSCOPY (EGD) WITH PROPOFOL N/A 04/24/2018   Dr. Jena Gauss: Normal esophagus.  Status post dilation for history of dysphagia.  Diffuse moderate mucosal changes in the entire stomach consistent with portal hypertensive gastropathy.  Biopsy with chronic inactive gastritis, no H. pylori.   ESOPHAGOGASTRODUODENOSCOPY (EGD) WITH PROPOFOL N/A 09/03/2022   Procedure: ESOPHAGOGASTRODUODENOSCOPY (EGD) WITH PROPOFOL;  Surgeon: Corbin Ade, MD;  [facility];  Service: Endoscopy;  Laterality: N/A;  2:30 pm, asa 3   EXCISIONAL HEMORRHOIDECTOMY     MALONEY DILATION N/A 04/24/2018   Procedure: MALONEY DILATION;  Surgeon: Corbin Ade, MD;  [facility];  Service: Endoscopy;  Laterality: N/A;   MALONEY DILATION N/A 09/03/2022   Procedure: Elease Hashimoto DILATION;  Surgeon: Corbin Ade, MD;  [facility];  Service: Endoscopy;  Laterality: N/A;    OB History     Gravida  4   Para  0   Term      Preterm      AB  4   Living  0      SAB  4   IAB      Ectopic      Multiple      Live Births              Social History   Socioeconomic History   Marital status: Married    Spouse name: Not on file   Number of children: Not on file   Years of education: Not on file   Highest education level: Not on file  Occupational History   Not on file  Tobacco Use   Smoking status: Every Day    Current packs/day: 0.50    Average packs/day: 1 pack/day for 23.2 years (23.1 ttl pk-yrs)    Types: Cigarettes    Start date: 02/29/2000   Smokeless tobacco: Never  Vaping Use   Vaping status: Never Used  Substance and Sexual Activity   Alcohol use: Not Currently    Comment: special occassion   Drug use: No   Sexual activity: Yes    Birth control/protection: Surgical    Comment: hyst  Other Topics Concern   Not on file  Social History  Narrative   Not on file   Social Drivers of Health   Financial Resource Strain: Low Risk  (08/10/2021)   Overall Financial Resource Strain (CARDIA)    Difficulty of Paying Living Expenses: Not very hard  Food Insecurity: No Food Insecurity (08/10/2021)   Hunger Vital Sign    Worried About Running Out of Food in the Last Year: Never true    Ran Out of Food in the Last Year: Never true  Transportation Needs: No Transportation Needs (08/10/2021)   PRAPARE - Administrator, Civil Service (Medical): No    Lack of Transportation (Non-Medical): No  Physical Activity: Insufficiently Active (08/10/2021)   Exercise Vital Sign    Days of Exercise per Week: 2 days    Minutes of Exercise per Session: 20 min  Stress: Stress Concern Present (08/10/2021)   [employer] of Occupational Health - Occupational Stress Questionnaire    Feeling of Stress : Rather much  Social Connections: Socially Integrated  (08/10/2021)   Social Connection and Isolation Panel [NHANES]    Frequency of Communication with Friends and Family: Once a week    Frequency of Social Gatherings with Friends and Family: Twice a week    Attends Religious Services: More than 4 times per year    Active Member of [employer] or Organizations: Yes    Attends Engineer, structural: More than 4 times per year    Marital Status: Married    Family History  Adopted: Yes  Problem Relation Age of Onset   Heart disease Father    Hypotension Father    Diabetes Father    Allergic rhinitis Father    Hyperlipidemia Maternal Grandfather    Hypertension Maternal Grandfather    Heart disease Maternal Grandfather    Allergic rhinitis Maternal Grandfather    Allergic rhinitis Mother    Allergic rhinitis Maternal Grandmother    Lymphoma Maternal Grandmother    Asthma Brother    Allergic rhinitis Brother    Asthma Brother    Allergic rhinitis Brother    COPD Brother    Allergic rhinitis Brother    Angioedema Neg Hx    Atopy Neg Hx    Eczema Neg Hx    Immunodeficiency Neg Hx    Urticaria Neg Hx    Colon cancer Neg Hx      Current Outpatient Medications:    ALPRAZolam (XANAX) 1 MG tablet, Take 1 tablet (1 mg total) by mouth 2 (two) times daily as needed for anxiety., Disp: 60 tablet, Rfl: 3   amoxicillin-clavulanate (AUGMENTIN) 875-125 MG tablet, Take 1 tablet by mouth every 12 (twelve) hours., Disp: 14 tablet, Rfl: 0   budesonide-formoterol (SYMBICORT) 160-4.5 MCG/ACT inhaler, Inhale 2 puffs into the lungs 2 (two) times daily., Disp: 1 Inhaler, Rfl: 11   Buprenorphine HCl-Naloxone HCl (SUBOXONE) 8-2 MG FILM, Place 8 mg of opioid under the tongue daily., Disp: , Rfl:    famotidine (PEPCID) 20 MG tablet, Take 1 tablet (20 mg total) by mouth at bedtime., Disp: 30 tablet, Rfl: 5   lubiprostone (AMITIZA) 24 MCG capsule, Take 1 capsule (24 mcg total) by mouth 2 (two) times daily with a meal., Disp: 180 capsule, Rfl: 3   Multiple  Vitamin (MULTIVITAMIN) tablet, Take 1 tablet by mouth daily., Disp: , Rfl:    Omega-3 Fatty Acids (FISH OIL) 1000 MG CAPS, Take by mouth daily. , Disp: , Rfl:    pantoprazole (PROTONIX) 40 MG tablet, Take 1 tablet (40 mg total) by mouth  2 (two) times daily before a meal., Disp: 180 tablet, Rfl: 3   promethazine-dextromethorphan (PROMETHAZINE-DM) 6.25-15 MG/5ML syrup, Take 5 mLs by mouth 4 (four) times daily as needed., Disp: 118 mL, Rfl: 0   sertraline (ZOLOFT) 100 MG tablet, Take 1 tablet (100 mg total) by mouth daily., Disp: 30 tablet, Rfl: 11   traZODone (DESYREL) 50 MG tablet, Take 1 tablet (50 mg total) by mouth at bedtime., Disp: 30 tablet, Rfl: 3   divalproex (DEPAKOTE) 500 MG DR tablet, Take 3 tablets (1,500 mg total) by mouth at bedtime. (Patient not taking: Reported on 05/10/2023), Disp: 90 tablet, Rfl: 3   [START ON 05/12/2023] estradiol (VIVELLE-DOT) 0.1 MG/24HR patch, Place 1 patch (0.1 mg total) onto the skin 2 (two) times a week., Disp: 24 patch, Rfl: 3   fluconazole (DIFLUCAN) 150 MG tablet, TAKE 1 TABLET BY MOUTH NOW THEN  REPEAT  IN  3  DAYS (Patient not taking: Reported on 05/10/2023), Disp: 2 tablet, Rfl: 3   mirabegron ER (MYRBETRIQ) 50 MG TB24 tablet, Take 1 tablet (50 mg total) by mouth daily., Disp: 90 each, Rfl: 11   Promethazine HCl (PHENERGAN PO), Take by mouth., Disp: , Rfl:    zolpidem (AMBIEN) 10 MG tablet, , Disp: , Rfl: 0  Review of Systems  Review of Systems  Constitutional: Negative for fever, chills, weight loss, malaise/fatigue and diaphoresis.  HENT: Negative for hearing loss, ear pain, nosebleeds, congestion, sore throat, neck pain, tinnitus and ear discharge.   Eyes: Negative for blurred vision, double vision, photophobia, pain, discharge and redness.  Respiratory: Negative for cough, hemoptysis, sputum production, shortness of breath, wheezing and stridor.   Cardiovascular: Negative for chest pain, palpitations, orthopnea, claudication, leg swelling and PND.   Gastrointestinal: negative for abdominal pain. Negative for heartburn, nausea, vomiting, diarrhea, constipation, blood in stool and melena.  Genitourinary: Negative for dysuria, urgency, frequency, hematuria and flank pain.  Musculoskeletal: Negative for myalgias, back pain, joint pain and falls.  Skin: Negative for itching and rash.  Neurological: Negative for dizziness, tingling, tremors, sensory change, speech change, focal weakness, seizures, loss of consciousness, weakness and headaches.  Endo/Heme/Allergies: Negative for environmental allergies and polydipsia. Does not bruise/bleed easily.  Psychiatric/Behavioral: Negative for depression, suicidal ideas, hallucinations, memory loss and substance abuse. The patient is not nervous/anxious and does not have insomnia.        Objective:  Blood pressure 126/65, pulse 76, height 5\' 5"  (1.651 m), weight 163 lb (73.9 kg).   Physical Exam  Vitals reviewed. Constitutional: She is oriented to person, place, and time. She appears well-developed and well-nourished.  HENT:  Head: Normocephalic and atraumatic.        Right Ear: External ear normal.  Left Ear: External ear normal.  Nose: Nose normal.  Mouth/Throat: Oropharynx is clear and moist.  Eyes: Conjunctivae and EOM are normal. Pupils are equal, round, and reactive to light. Right eye exhibits no discharge. Left eye exhibits no discharge. No scleral icterus.  Neck: Normal range of motion. Neck supple. No tracheal deviation present. No thyromegaly present.  Cardiovascular: Normal rate, regular rhythm, normal heart sounds and intact distal pulses.  Exam reveals no gallop and no friction rub.   No murmur heard. Respiratory: Effort normal and breath sounds normal. No respiratory distress. She has no wheezes. She has no rales. She exhibits no tenderness.  GI: Soft. Bowel sounds are normal. She exhibits no distension and no mass. There is no tenderness. There is no rebound and no guarding.   Genitourinary:  Breasts no masses skin changes or nipple changes bilaterally      Vulva is normal without lesions Vagina is pink moist without discharge Cervix normal in appearance and pap is done Uterus is normal size shape and contour Adnexa is negative with normal sized ovaries   Musculoskeletal: Normal range of motion. She exhibits no edema and no tenderness.  Neurological: She is alert and oriented to person, place, and time. She has normal reflexes. She displays normal reflexes. No cranial nerve deficit. She exhibits normal muscle tone. Coordination normal.  Skin: Skin is warm and dry. No rash noted. No erythema. No pallor.  Psychiatric: She has a normal mood and affect. Her behavior is normal. Judgment and thought content normal.       Medications Ordered at today's visit: Meds ordered this encounter  Medications   estradiol (VIVELLE-DOT) 0.1 MG/24HR patch    Sig: Place 1 patch (0.1 mg total) onto the skin 2 (two) times a week.    Dispense:  24 patch    Refill:  3   mirabegron ER (MYRBETRIQ) 50 MG TB24 tablet    Sig: Take 1 tablet (50 mg total) by mouth daily.    Dispense:  90 each    Refill:  11   sertraline (ZOLOFT) 100 MG tablet    Sig: Take 1 tablet (100 mg total) by mouth daily.    Dispense:  30 tablet    Refill:  11   ALPRAZolam (XANAX) 1 MG tablet    Sig: Take 1 tablet (1 mg total) by mouth 2 (two) times daily as needed for anxiety.    Dispense:  60 tablet    Refill:  3   traZODone (DESYREL) 50 MG tablet    Sig: Take 1 tablet (50 mg total) by mouth at bedtime.    Dispense:  30 tablet    Refill:  3    Other orders placed at today's visit: No orders of the defined types were placed in this encounter.    ASSESSMENT + PLAN:    ICD-10-CM   1. 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 Vasomotor symptoms due to menopause  N95.1     4. Depression with anxiety  F41.8     5. Insomnia, unspecified  type  G47.00           Return in about 3 years (around 05/10/2026) for yearly, with Dr Despina Hidden.

## 2023-05-11 ENCOUNTER — Encounter: Payer: Self-pay | Admitting: Obstetrics & Gynecology

## 2023-05-17 ENCOUNTER — Ambulatory Visit: Admitting: Internal Medicine

## 2023-05-17 ENCOUNTER — Encounter: Payer: Self-pay | Admitting: Internal Medicine

## 2023-09-21 ENCOUNTER — Ambulatory Visit: Admitting: Gastroenterology

## 2023-09-21 ENCOUNTER — Encounter: Payer: Self-pay | Admitting: Gastroenterology

## 2023-10-06 ENCOUNTER — Telehealth: Payer: Self-pay | Admitting: *Deleted

## 2023-10-06 NOTE — Telephone Encounter (Signed)
 Voice mail left and she is saying new different pain in her abdomen--since this is a new place and she has an appt 8/26 we need to move up to [facility] on 8/12 that follow appt that afternoon

## 2023-10-12 ENCOUNTER — Ambulatory Visit (INDEPENDENT_AMBULATORY_CARE_PROVIDER_SITE_OTHER): Admitting: Gastroenterology

## 2023-10-12 ENCOUNTER — Encounter: Payer: Self-pay | Admitting: Gastroenterology

## 2023-10-12 VITALS — BP 117/65 | HR 61 | Temp 98.4°F | Ht 65.0 in | Wt 170.4 lb

## 2023-10-12 DIAGNOSIS — R109 Unspecified abdominal pain: Secondary | ICD-10-CM | POA: Diagnosis not present

## 2023-10-12 DIAGNOSIS — R110 Nausea: Secondary | ICD-10-CM | POA: Diagnosis not present

## 2023-10-12 DIAGNOSIS — K5909 Other constipation: Secondary | ICD-10-CM

## 2023-10-12 DIAGNOSIS — K5903 Drug induced constipation: Secondary | ICD-10-CM

## 2023-10-12 DIAGNOSIS — K219 Gastro-esophageal reflux disease without esophagitis: Secondary | ICD-10-CM | POA: Diagnosis not present

## 2023-10-12 DIAGNOSIS — R1013 Epigastric pain: Secondary | ICD-10-CM

## 2023-10-12 DIAGNOSIS — R112 Nausea with vomiting, unspecified: Secondary | ICD-10-CM

## 2023-10-12 MED ORDER — SUCRALFATE 1 G PO TABS: 1.0000 g | ORAL_TABLET | Freq: Three times a day (TID) | ORAL | 0 refills | Status: DC

## 2023-10-12 MED ORDER — PANTOPRAZOLE SODIUM 40 MG PO TBEC: 40.0000 mg | DELAYED_RELEASE_TABLET | Freq: Two times a day (BID) | ORAL | 3 refills | Status: DC

## 2023-10-12 MED ORDER — FAMOTIDINE 20 MG PO TABS: 20.0000 mg | ORAL_TABLET | Freq: Two times a day (BID) | ORAL | 1 refills | Status: DC

## 2023-10-12 NOTE — Progress Notes (Signed)
 GI Office Note    Referring Provider: [facility] Primary Care Physician:  [facility]  Primary Gastroenterologist: Ozell Hollingshead, MD   Chief Complaint   Chief Complaint  Patient presents with   Abdominal Pain    States that she is having upper abd pain now    History of Present Illness   Jill Harrell is a 63 y.o. female presenting today for further evaluation of a new pain she is experiencing. She was last seen in 03/2023. She has h/o GERD, constipation, dysphagia.   Discussed the use of AI scribe software for clinical note transcription with the patient, who gave verbal consent to proceed.   She experiences severe upper abdominal pain. The pain began the night before she called the office, around September 26, 2023, and is severe enough to prevent her from lying on her stomach. She alternates between using a heating pad and an ice pack to alleviate the pain, which she describes as a muscle spasm causing tightness and difficulty breathing, leading to a panic attack. The pain is intermittent but difficult to alleviate once it starts.  She takes pantoprazole  twice daily for her GERD and Amitiza  every other day to manage her constipation. Her husband purchased acid reducers, suspecting acid reflux might be contributing to the issue. She reports swelling and burning sensations in the area when it swells. She has also taken Phenergan , prescribed to her husband, to manage nausea. She experiences vomiting two to three times a week and denies recent heartburn or indigestion. No dysphagia.  She has bowel movements every morning, which is unusual for her, with stool consistency varying from very hard to very loose, including episodes of watery diarrhea once or twice a week. No blood or black stools are noted. She has been taking ibuprofen  800 mg, prescribed by her dentist for dental pain, three to four times a day since early July. She is concerned about the impact of  ibuprofen  on her stomach due to her history of ulcers.  She was previously on Suboxone for pain management but has been off it since June 2025.      Prior Data   UGI series 04/2023: IMPRESSION: Unremarkable upper GI series.  EGD 08/2022: -normal esophagus s/p dilation -small antral ulcers -reactive gastropathy, no h.pylori    EGD 04/2018:Normal esophagus. Status post dilation for history of dysphagia. Diffuse moderate mucosal changes in the entire stomach consistent with portal hypertensive gastropathy. Biopsy with chronic inactive gastritis, no H. pylori.    Abd U/S 2021: IMPRESSION: 1. Increased hepatic echogenicity typical of hepatic steatosis. There is no capsular nodularity to suggest cirrhosis. 2. No splenomegaly. 3. Small simple cysts in the liver and left kidney. 4. Post cholecystectomy without biliary dilatation     Medications   Current Outpatient Medications  Medication Sig Dispense Refill   ALPRAZolam  (XANAX ) 1 MG tablet Take 1 tablet (1 mg total) by mouth 2 (two) times daily as needed for anxiety. 60 tablet 3   amoxicillin -clavulanate (AUGMENTIN ) 875-125 MG tablet Take 1 tablet by mouth every 12 (twelve) hours. 14 tablet 0   budesonide -formoterol  (SYMBICORT ) 160-4.5 MCG/ACT inhaler Inhale 2 puffs into the lungs 2 (two) times daily. 1 Inhaler 11   Buprenorphine HCl-Naloxone HCl (SUBOXONE) 8-2 MG FILM Place 8 mg of opioid under the tongue daily.     divalproex  (DEPAKOTE ) 500 MG DR tablet Take 3 tablets (1,500 mg total) by mouth at bedtime. 90 tablet 3   estradiol  (VIVELLE -DOT) 0.1  MG/24HR patch Place 1 patch (0.1 mg total) onto the skin 2 (two) times a week. 24 patch 3   famotidine  (PEPCID ) 20 MG tablet Take 1 tablet (20 mg total) by mouth at bedtime. 30 tablet 5   lubiprostone  (AMITIZA ) 24 MCG capsule Take 1 capsule (24 mcg total) by mouth 2 (two) times daily with a meal. 180 capsule 3   mirabegron  ER (MYRBETRIQ ) 50 MG TB24 tablet Take 1 tablet (50 mg total) by mouth  daily. 90 each 11   Multiple Vitamin (MULTIVITAMIN) tablet Take 1 tablet by mouth daily.     Omega-3 Fatty Acids (FISH OIL) 1000 MG CAPS Take by mouth daily.      pantoprazole  (PROTONIX ) 40 MG tablet Take 1 tablet (40 mg total) by mouth 2 (two) times daily before a meal. 180 tablet 3   Promethazine  HCl (PHENERGAN  PO) Take by mouth.     sertraline  (ZOLOFT ) 100 MG tablet Take 1 tablet (100 mg total) by mouth daily. 30 tablet 11   traZODone  (DESYREL ) 50 MG tablet Take 1 tablet (50 mg total) by mouth at bedtime. 30 tablet 3   zolpidem  (AMBIEN ) 10 MG tablet   0   No current facility-administered medications for this visit.    Allergies   Allergies as of 10/12/2023 - Review Complete 10/12/2023  Allergen Reaction Noted   Mixed ragweed Other (See Comments) 04/17/2014   Pollen extract Other (See Comments) 04/17/2014      Review of Systems   General: Negative for anorexia, weight loss, fever, chills, fatigue, weakness. ENT: Negative for hoarseness, difficulty swallowing , nasal congestion. CV: Negative for chest pain, angina, palpitations, dyspnea on exertion, peripheral edema.  Respiratory: Negative for dyspnea at rest, dyspnea on exertion, cough, sputum, wheezing.  GI: See history of present illness. GU:  Negative for dysuria, hematuria, urinary incontinence, urinary frequency, nocturnal urination.  Endo: Negative for unusual weight change.     Physical Exam   BP 117/65 (BP Location: Right Arm, Patient Position: Sitting, Cuff Size: Normal)   Pulse 61   Temp 98.4 F (36.9 C) (Oral)   Ht 5' 5 (1.651 m)   Wt 170 lb 6.4 oz (77.3 kg)   SpO2 98%   BMI 28.36 kg/m    General: Well-nourished, well-developed in no acute distress.  Eyes: No icterus. Mouth: Oropharyngeal mucosa moist and pink   Lungs: Clear to auscultation bilaterally.  Heart: Regular rate and rhythm, no murmurs rubs or gallops.  Abdomen: Bowel sounds are normal, nondistended, no hepatosplenomegaly or masses,  no  abdominal bruits or hernia , no rebound or guarding. Epig tenderness, moderate Rectal: not performed Extremities: No lower extremity edema. No clubbing or deformities. Neuro: Alert and oriented x 4   Skin: Warm and dry, no jaundice.   Psych: Alert and cooperative, normal mood and affect.  Labs   04/2023: WBC 11.3, Hgb 16, Plt 360, glucose 77, BUN 13, creatinine 0.7, sodium 143, potassium 3.8, albumin 4.2, total bilirubin less than 0.2, alk phos 92, AST 12, ALT 12, vitamin D 17.4  Imaging Studies   No results found.  Assessment/Plan:     Upper abdominal pain/GERD/vomiting Intermittent upper abdominal pain with swelling and vomiting, exacerbated by eating, began around September 25, 2023. Severe pain prevents lying on the stomach. History of gastric ulcers noted on last egd 08/2022. UGI series 04/2023 unremarkable. S/p cholecystectomy.  Differential diagnosis includes ulcer exacerbation due to ibuprofen  use, given history of gastric ulcers and recent increase in ibuprofen  intake for dental pain. Cannot  exclude other etiologies at this time.   - Order CT of the abdomen/pelvis with contrast to evaluate for ulcer exacerbation and rule out other causes of abdominal pain. -cbc, cmet, lipase - Advise discontinuation of ibuprofen  due to potential exacerbation of gastric ulcers. -continue pantoprazole  40mg  bid before meals -increase famotidine  to 20mg  BID at lunch and bedtime. -add carafate  1g at meals and at bedtime for 2 weeks   Chronic constipation   Chronic constipation managed with Amitiza  taken every other day. Bowel movements occur daily but are irregular in consistency, ranging from hard to watery. Watery stools occur once or twice a week, possibly due to incomplete evacuation of solid stool. - Continue current regimen of Amitiza  as needed for constipation.  Colon cancer screening -did not complete cologuard last year -unaware of previous colonoscopy -discuss after acute symptoms  addressed  Fatty liver Noted on prior u/s in 2021. -reassess at time of upcoming labs and CT of the abdomen             Sonny RAMAN. Ezzard, MHS, PA-C [facility]

## 2023-10-12 NOTE — Patient Instructions (Addendum)
 Please complete labs and CT scan.   Continue pantoprazole  40mg  twice daily before breakfast and evening meal. New RX sent to pharmacy.  Increase famotidine  to 20mg  twice daily at lunch and bedtime. New RX sent to pharmacy.  Add carafate  1gram at meals and at bedtime for 2 weeks. New RX sent to pharmacy.   Try to reduce ibuprofen  use, this is likely contributing to your abdominal pain.

## 2023-10-13 ENCOUNTER — Telehealth: Payer: Self-pay | Admitting: *Deleted

## 2023-10-13 NOTE — Telephone Encounter (Signed)
 Called pt and gave appt for CT. She voiced understanding.

## 2023-10-13 NOTE — Telephone Encounter (Signed)
 PA approved via cohere Authorization #[id number]  DOS: 10/13/2023 - 12/12/2023

## 2023-10-24 ENCOUNTER — Telehealth: Payer: Self-pay | Admitting: Obstetrics & Gynecology

## 2023-10-24 MED ORDER — FLUCONAZOLE 150 MG PO TABS: ORAL_TABLET | ORAL | 1 refills | Status: DC

## 2023-10-24 NOTE — Telephone Encounter (Signed)
 Pt aware that diflucan  sent to [facility] in [place]

## 2023-10-24 NOTE — Telephone Encounter (Signed)
 Patient has started on an antibiotic and is in need of a refill on diflucan . Patient would like it sent to the [facility] in [place]. Please advise.

## 2023-10-24 NOTE — Addendum Note (Signed)
 Addended by: Hila Bolding A on: 10/24/2023 01:25 PM   Modules accepted: Orders

## 2023-10-25 ENCOUNTER — Ambulatory Visit: Admitting: Gastroenterology

## 2023-10-27 ENCOUNTER — Encounter: Payer: Self-pay | Admitting: Gastroenterology

## 2023-11-14 ENCOUNTER — Other Ambulatory Visit: Payer: Self-pay | Admitting: Obstetrics & Gynecology

## 2023-11-17 ENCOUNTER — Ambulatory Visit (HOSPITAL_COMMUNITY)
Admission: RE | Admit: 2023-11-17 | Discharge: 2023-11-17 | Disposition: A | Discharge status: Home / Self Care | Source: Ambulatory Visit | Attending: Gastroenterology | Admitting: Gastroenterology

## 2023-11-17 DIAGNOSIS — R1013 Epigastric pain: Secondary | ICD-10-CM | POA: Insufficient documentation

## 2023-11-17 DIAGNOSIS — R112 Nausea with vomiting, unspecified: Secondary | ICD-10-CM | POA: Insufficient documentation

## 2023-11-17 DIAGNOSIS — K5903 Drug induced constipation: Secondary | ICD-10-CM | POA: Insufficient documentation

## 2023-11-17 DIAGNOSIS — K219 Gastro-esophageal reflux disease without esophagitis: Secondary | ICD-10-CM | POA: Diagnosis present

## 2023-11-17 MED ORDER — IOHEXOL 300 MG/ML  SOLN
100.0000 mL | Freq: Once | INTRAMUSCULAR | Status: AC | PRN
  Administered 2023-11-17: 100 mL via INTRAVENOUS

## 2023-11-20 ENCOUNTER — Ambulatory Visit: Payer: Self-pay | Admitting: Gastroenterology

## 2023-11-28 NOTE — Telephone Encounter (Signed)
 Pt was made aware and verbalized understanding. Pt stated that her abdominal pain is better now. F/u appt has been made.

## 2023-12-05 ENCOUNTER — Telehealth: Payer: Self-pay

## 2023-12-05 NOTE — Telephone Encounter (Signed)
 Please advise, asking if she can become a new patient.  Copied from CRM #[id number]. Topic: Appointments - Scheduling Inquiry for Clinic >> Dec 02, 2023  1:09 PM Lauren C wrote: Reason for CRM: Pt calling in requesting to become established with Dr. Suzzane Blanch. She says her husband Gail Vendetti has been a patient of his for a long time. She would like to know if Dr. Blanch would accept her as a new pt and make an exception. If not, she would be okay with seeing someone else, but he is preferred. Please give her a call to let her know of his response at [phone] >> Dec 02, 2023  2:37 PM Geni HERO wrote: Unfortunately Dr . Blanch is not taking new patients his panel is full >> Dec 02, 2023  2:33 PM Tinnie C wrote: Pt is okay to wait until February, she is just wanting to know if Dr. Blanch will make a new patient exception. Did he deny the request?  >> Dec 02, 2023  1:49 PM Geni HERO wrote: You can schedule new patient appointments with [facility] or [facility] but we are all booked out until #rf week of February

## 2024-01-10 ENCOUNTER — Ambulatory Visit: Admitting: Gastroenterology

## 2024-01-18 ENCOUNTER — Ambulatory Visit: Admitting: Internal Medicine

## 2024-03-01 ENCOUNTER — Encounter: Payer: Self-pay | Admitting: Gastroenterology

## 2024-03-06 ENCOUNTER — Ambulatory Visit: Admitting: Gastroenterology

## 2024-03-06 ENCOUNTER — Encounter: Payer: Self-pay | Admitting: Gastroenterology

## 2024-03-06 VITALS — BP 137/88 | HR 76 | Temp 97.7°F | Ht 65.0 in | Wt 165.8 lb

## 2024-03-06 DIAGNOSIS — K219 Gastro-esophageal reflux disease without esophagitis: Secondary | ICD-10-CM | POA: Diagnosis not present

## 2024-03-06 DIAGNOSIS — K5909 Other constipation: Secondary | ICD-10-CM

## 2024-03-06 DIAGNOSIS — Z1211 Encounter for screening for malignant neoplasm of colon: Secondary | ICD-10-CM

## 2024-03-06 DIAGNOSIS — E559 Vitamin D deficiency, unspecified: Secondary | ICD-10-CM | POA: Insufficient documentation

## 2024-03-06 DIAGNOSIS — K760 Fatty (change of) liver, not elsewhere classified: Secondary | ICD-10-CM | POA: Insufficient documentation

## 2024-03-06 DIAGNOSIS — K5900 Constipation, unspecified: Secondary | ICD-10-CM

## 2024-03-06 MED ORDER — LUBIPROSTONE 24 MCG PO CAPS: 24.0000 ug | ORAL_CAPSULE | Freq: Two times a day (BID) | ORAL | 3 refills | Status: AC

## 2024-03-06 MED ORDER — PANTOPRAZOLE SODIUM 40 MG PO TBEC: 40.0000 mg | DELAYED_RELEASE_TABLET | Freq: Two times a day (BID) | ORAL | 3 refills | Status: AC

## 2024-03-06 MED ORDER — FAMOTIDINE 20 MG PO TABS: 20.0000 mg | ORAL_TABLET | Freq: Two times a day (BID) | ORAL | 1 refills | Status: AC

## 2024-03-06 MED ORDER — SUCRALFATE 1 G PO TABS: 1.0000 g | ORAL_TABLET | Freq: Three times a day (TID) | ORAL | 0 refills | Status: AC

## 2024-03-06 NOTE — Patient Instructions (Signed)
 Please complete labs and cologuard test. We will be in touch with results as available.   I have refilled your lubiprostone , pantoprazole , famotidine , and your carafate .

## 2024-03-06 NOTE — Progress Notes (Signed)
 "    GI Office Note    Referring Provider: [facility] Primary Care Physician:  [facility]  Primary Gastroenterologist: Ozell Hollingshead, MD   Chief Complaint   Chief Complaint  Patient presents with   Follow-up    Follow up.     History of Present Illness   Jill Harrell is a 64 y.o. female presenting today for follow-up.  She was last seen in August 2025.  She has a history of GERD, constipation, dysphagia, fatty liver, vit D deficiency.  At last office visit she was complaining of upper abdominal pain.  At last office visit she was having intermittent upper abdominal pain with swelling and vomiting exacerbated by meals which began in July 2025.  She has a history of gastric ulcers on EGD in July 2024.  Upper GI series in February 2025 unremarkable.  She status postcholecystectomy.  We ordered a CT scan to evaluate for abdominal pain and advise discontinuing ibuprofen .  Continued on pantoprazole  40 mg twice daily and increase famotidine  to 20 mg twice daily before lunch and bedtime.  Added Carafate  1 g before every meal nightly for 2 weeks.  CT abdomen pelvis with contrast November 17, 2023 was unremarkable.  She did not complete labs that were ordered (lipase, CBC, CMET).  She is due for colon cancer screening.  Discussed the use of AI scribe software for clinical note transcription with the patient, who gave verbal consent to proceed.  History of Present Illness    She has not completed colorectal cancer screening although she states that she turned it in couple of years ago. I could only find a Cologuard order from June 2024 was cancelled (after not being completed). She has never had a colonoscopy and prefers Cologuard.   Constipation is well controlled with lubiprostone  (Amitiza ), and she is nearly out of this medication. No melena, brbpr.   She uses pantoprazole  for GERD and completed a two week course of sucralfate  in August 2025 for abdominal  pain. She currently describes her abdominal symptoms as amazing. She is doing well. No abdominal pain or heartburn.  She notes markedly increased appetite and feels hungry all the time, which is new for her. She has joined [employer]. She has no dysphagia. She is on a soft diet because of recent dental extractions and is working toward dentures.    Wt Readings from Last 3 Encounters:  03/06/24 165 lb 12.8 oz (75.2 kg)  10/12/23 170 lb 6.4 oz (77.3 kg)  05/10/23 163 lb (73.9 kg)      Prior Data     Results  Labs from April 08, 2023: Creatinine 0.7, total bilirubin less than 0.2, alk phos 92, AST 12, ALT 12, white blood cell count 11.3, hemoglobin 16, platelets 360, vit D 17.4L  CT abdomen pelvis with contrast November 17, 2023: IMPRESSION: Negative.  No acute findings or other significant abnormality.  UGI series 04/2023: IMPRESSION: Unremarkable upper GI series.   EGD 08/2022: -normal esophagus s/p dilation -small antral ulcers -reactive gastropathy, no h.pylori    EGD 04/2018:Normal esophagus. Status post dilation for history of dysphagia. Diffuse moderate mucosal changes in the entire stomach consistent with portal hypertensive gastropathy. Biopsy with chronic inactive gastritis, no H. pylori.    Abd U/S 2021: IMPRESSION: 1. Increased hepatic echogenicity typical of hepatic steatosis. There is no capsular nodularity to suggest cirrhosis. 2. No splenomegaly. 3. Small simple cysts in the liver and left kidney. 4. Post cholecystectomy without biliary  dilatation    Medications   Current Outpatient Medications  Medication Sig Dispense Refill   ALPRAZolam  (XANAX ) 1 MG tablet Take 1 tablet (1 mg total) by mouth 2 (two) times daily as needed for anxiety. 60 tablet 3   budesonide -formoterol  (SYMBICORT ) 160-4.5 MCG/ACT inhaler Inhale 2 puffs into the lungs 2 (two) times daily. 1 Inhaler 11   divalproex  (DEPAKOTE ) 500 MG DR tablet Take 3 tablets (1,500 mg total) by  mouth at bedtime. 90 tablet 3   estradiol  (VIVELLE -DOT) 0.1 MG/24HR patch Place 1 patch (0.1 mg total) onto the skin 2 (two) times a week. 24 patch 3   famotidine  (PEPCID ) 20 MG tablet Take 1 tablet (20 mg total) by mouth 2 (two) times daily. 60 tablet 1   lubiprostone  (AMITIZA ) 24 MCG capsule Take 1 capsule (24 mcg total) by mouth 2 (two) times daily with a meal. 180 capsule 3   Multiple Vitamin (MULTIVITAMIN) tablet Take 1 tablet by mouth daily.     MYRBETRIQ  50 MG TB24 tablet Take 1 tablet by mouth once daily 30 tablet 11   Omega-3 Fatty Acids (FISH OIL) 1000 MG CAPS Take by mouth daily.      pantoprazole  (PROTONIX ) 40 MG tablet Take 1 tablet (40 mg total) by mouth 2 (two) times daily before a meal. 180 tablet 3   Promethazine  HCl (PHENERGAN  PO) Take by mouth.     sertraline  (ZOLOFT ) 100 MG tablet Take 1 tablet (100 mg total) by mouth daily. 30 tablet 11   sucralfate  (CARAFATE ) 1 g tablet Take 1 tablet (1 g total) by mouth 4 (four) times daily -  with meals and at bedtime for 14 days. 56 tablet 0   traZODone  (DESYREL ) 50 MG tablet Take 1 tablet (50 mg total) by mouth at bedtime. 30 tablet 3   zolpidem  (AMBIEN ) 10 MG tablet   0   No current facility-administered medications for this visit.    Allergies   Allergies as of 03/06/2024 - Review Complete 03/06/2024  Allergen Reaction Noted   Mixed ragweed Other (See Comments) 04/17/2014   Pollen extract Other (See Comments) 04/17/2014     Past Medical History   Past Medical History:  Diagnosis Date   Anxiety    Arthritis    hip and wrist   Brain cancer (HCC)    2009 glioblastoma (stage 4) Dr. Haidee [facility]   Cancer [facility])    Degenerative disc disease at L5-S1 level    GERD (gastroesophageal reflux disease)    Seizures (HCC)    brain cancer    Past Surgical History   Past Surgical History:  Procedure Laterality Date   ABDOMINAL HYSTERECTOMY     APPENDECTOMY     BIOPSY  04/24/2018   Procedure: BIOPSY;  Surgeon: Shaaron Lamar HERO, MD;  [facility]: [facility];  Service: Endoscopy;;  gastric   BIOPSY  09/03/2022   Procedure: BIOPSY;  Surgeon: Shaaron Lamar HERO, MD;  [facility]: [facility];  Service: Endoscopy;;   BRAIN SURGERY X2     CHOLECYSTECTOMY     ESOPHAGOGASTRODUODENOSCOPY (EGD) WITH PROPOFOL  N/A 04/24/2018   Dr. Shaaron: Normal esophagus.  Status post dilation for history of dysphagia.  Diffuse moderate mucosal changes in the entire stomach consistent with portal hypertensive gastropathy.  Biopsy with chronic inactive gastritis, no H. pylori.   ESOPHAGOGASTRODUODENOSCOPY (EGD) WITH PROPOFOL  N/A 09/03/2022   Procedure: ESOPHAGOGASTRODUODENOSCOPY (EGD) WITH PROPOFOL ;  Surgeon: Shaaron Lamar HERO, MD;  [facility]: [facility];  Service: Endoscopy;  Laterality: N/A;  2:30 pm, asa 3   EXCISIONAL HEMORRHOIDECTOMY     MALONEY DILATION N/A 04/24/2018   Procedure: MALONEY DILATION;  Surgeon: Shaaron Lamar HERO, MD;  [facility]: [facility];  Service: Endoscopy;  Laterality: N/A;   MALONEY DILATION N/A 09/03/2022   Procedure: AGAPITO DILATION;  Surgeon: Shaaron Lamar HERO, MD;  Location: [facility];  Service: Endoscopy;  Laterality: N/A;    Past Family History   Family History  Adopted: Yes  Problem Relation Age of Onset   Heart disease Father    Hypotension Father    Diabetes Father    Allergic rhinitis Father    Hyperlipidemia Maternal Grandfather    Hypertension Maternal Grandfather    Heart disease Maternal Grandfather    Allergic rhinitis Maternal Grandfather    Allergic rhinitis Mother    Allergic rhinitis Maternal Grandmother    Lymphoma Maternal Grandmother    Asthma Brother    Allergic rhinitis Brother    Asthma Brother    Allergic rhinitis Brother    COPD Brother    Allergic rhinitis Brother    Angioedema Neg Hx    Atopy Neg Hx    Eczema Neg Hx    Immunodeficiency Neg Hx    Urticaria Neg Hx    Colon cancer Neg Hx     Past Social History   Social History   Socioeconomic History   Marital status:  Married    Spouse name: Not on file   Number of children: Not on file   Years of education: Not on file   Highest education level: Not on file  Occupational History   Not on file  Tobacco Use   Smoking status: Every Day    Current packs/day: 0.50    Average packs/day: 1 pack/day for 24.0 years (23.5 ttl pk-yrs)    Types: Cigarettes    Start date: 02/29/2000   Smokeless tobacco: Never  Vaping Use   Vaping status: Never Used  Substance and Sexual Activity   Alcohol  use: Not Currently    Comment: special occassion   Drug use: No   Sexual activity: Yes    Birth control/protection: Surgical    Comment: hyst  Other Topics Concern   Not on file  Social History Narrative   Not on file   Social Drivers of Health   Tobacco Use: High Risk (03/06/2024)   Patient History    Smoking Tobacco Use: Every Day    Smokeless Tobacco Use: Never    Passive Exposure: Not on file  Financial Resource Strain: Low Risk (08/10/2021)   Overall Financial Resource Strain (CARDIA)    Difficulty of Paying Living Expenses: Not very hard  Food Insecurity: No Food Insecurity (08/10/2021)   Hunger Vital Sign    Worried About Running Out of Food in the Last Year: Never true    Ran Out of Food in the Last Year: Never true  Transportation Needs: No Transportation Needs (08/10/2021)   PRAPARE - Administrator, Civil Service (Medical): No    Lack of Transportation (Non-Medical): No  Physical Activity: Insufficiently Active (08/10/2021)   Exercise Vital Sign    Days of Exercise per Week: 2 days    Minutes of Exercise per Session: 20 min  Stress: Stress Concern Present (08/10/2021)   [employer] of Occupational Health - Occupational Stress Questionnaire    Feeling of Stress : Rather much  Social Connections: Socially Integrated (08/10/2021)   Social Connection and Isolation Panel    Frequency of Communication  with Friends and Family: Once a week    Frequency of Social Gatherings with Friends and  Family: Twice a week    Attends Religious Services: More than 4 times per year    Active Member of [employer] or Organizations: Yes    Attends Engineer, Structural: More than 4 times per year    Marital Status: Married  Catering Manager Violence: Not At Risk (08/10/2021)   Humiliation, Afraid, Rape, and Kick questionnaire    Fear of Current or Ex-Partner: No    Emotionally Abused: No    Physically Abused: No    Sexually Abused: No  Depression (PHQ2-9): High Risk (05/10/2023)   Depression (PHQ2-9)    PHQ-2 Score: 20  Alcohol  Screen: Low Risk (08/10/2021)   Alcohol  Screen    Last Alcohol  Screening Score (AUDIT): 0  Housing: Medium Risk (08/10/2021)   Housing    Last Housing Risk Score: 1  Utilities: Not on file  Health Literacy: Not on file    Review of Systems   General: Negative for anorexia, weight loss, fever, chills, +fatigue, weakness. ENT: Negative for hoarseness, difficulty swallowing , nasal congestion. CV: Negative for chest pain, angina, palpitations, dyspnea on exertion, peripheral edema.  Respiratory: Negative for dyspnea at rest, dyspnea on exertion, cough, sputum, wheezing.  GI: See history of present illness. GU:  Negative for dysuria, hematuria, urinary incontinence, urinary frequency, nocturnal urination.  Endo: Negative for unusual weight change.     Physical Exam   BP 137/88 (BP Location: Right Arm, Patient Position: Sitting, Cuff Size: Large)   Pulse 76   Temp 97.7 F (36.5 C) (Temporal)   Ht 5' 5 (1.651 m)   Wt 165 lb 12.8 oz (75.2 kg)   BMI 27.59 kg/m    General: Well-nourished, well-developed in no acute distress.  Eyes: No icterus. Mouth: Oropharyngeal mucosa moist and pink   Lungs: Clear to auscultation bilaterally.  Heart: Regular rate and rhythm, no murmurs rubs or gallops.  Abdomen: Bowel sounds are normal, nontender, nondistended, no hepatosplenomegaly or masses,  no abdominal bruits or hernia , no rebound or guarding.  Rectal: not  performed Extremities: No lower extremity edema. No clubbing or deformities. Neuro: Alert and oriented x 4   Skin: Warm and dry, no jaundice.   Psych: Alert and cooperative, normal mood and affect.  Labs   See above Imaging Studies   No results found.  Assessment/Plan:    Assessment & Plan Colorectal cancer screening - She declined colonoscopy, prefers non-invasive testing. - She is agreeable to Cologuard for colorectal cancer screening.   Fatty liver noted on previous imaging (U/S 2021) -CT of liver with contrast 10/2023 unremarkable -LFTs normal one year ago -recheck labs for FIB4 calculation -recheck vit D level given increased risk for developing fibrosis has been noted in patients with vitamin D deficiency. -recommend daily exercise, weight management, limit alcohol  use to rare use   Gastroesophageal reflux disease -continue pantoprazole  40mg  BID before meals -can use famotidine  20mg  bid prn breakthrough symptoms -limited supply of carafate  to have on hand for breakthrough epigastric burning, worked well for her last year. She is aware that this will not be chronic RX   Constipation Chronic constipation well-managed with lubiprostone .  -continue lubiprostone  24mcg BID with meals         Jill Harrell. Ezzard, MHS, PA-C [facility]  "

## 2024-03-13 ENCOUNTER — Encounter: Payer: Self-pay | Admitting: Internal Medicine

## 2024-03-13 ENCOUNTER — Ambulatory Visit: Admitting: Internal Medicine

## 2024-03-13 VITALS — BP 132/81 | HR 79 | Ht 65.0 in | Wt 168.4 lb

## 2024-03-13 DIAGNOSIS — K219 Gastro-esophageal reflux disease without esophagitis: Secondary | ICD-10-CM | POA: Diagnosis not present

## 2024-03-13 DIAGNOSIS — K5900 Constipation, unspecified: Secondary | ICD-10-CM | POA: Diagnosis not present

## 2024-03-13 DIAGNOSIS — J440 Chronic obstructive pulmonary disease with acute lower respiratory infection: Secondary | ICD-10-CM

## 2024-03-13 DIAGNOSIS — C719 Malignant neoplasm of brain, unspecified: Secondary | ICD-10-CM | POA: Diagnosis not present

## 2024-03-13 DIAGNOSIS — R739 Hyperglycemia, unspecified: Secondary | ICD-10-CM | POA: Diagnosis not present

## 2024-03-13 DIAGNOSIS — F331 Major depressive disorder, recurrent, moderate: Secondary | ICD-10-CM | POA: Diagnosis not present

## 2024-03-13 DIAGNOSIS — J209 Acute bronchitis, unspecified: Secondary | ICD-10-CM | POA: Insufficient documentation

## 2024-03-13 DIAGNOSIS — E782 Mixed hyperlipidemia: Secondary | ICD-10-CM

## 2024-03-13 DIAGNOSIS — J019 Acute sinusitis, unspecified: Secondary | ICD-10-CM | POA: Insufficient documentation

## 2024-03-13 DIAGNOSIS — F411 Generalized anxiety disorder: Secondary | ICD-10-CM | POA: Diagnosis not present

## 2024-03-13 MED ORDER — FLUTICASONE-SALMETEROL 250-50 MCG/ACT IN AEPB: 1.0000 | INHALATION_SPRAY | Freq: Two times a day (BID) | RESPIRATORY_TRACT | 3 refills | Status: AC

## 2024-03-13 MED ORDER — TRAZODONE HCL 50 MG PO TABS: 50.0000 mg | ORAL_TABLET | Freq: Every day | ORAL | 5 refills | Status: AC

## 2024-03-13 MED ORDER — ALBUTEROL SULFATE HFA 108 (90 BASE) MCG/ACT IN AERS: 2.0000 | INHALATION_SPRAY | Freq: Four times a day (QID) | RESPIRATORY_TRACT | 2 refills | Status: AC | PRN

## 2024-03-13 MED ORDER — ALPRAZOLAM 1 MG PO TABS: 1.0000 mg | ORAL_TABLET | Freq: Two times a day (BID) | ORAL | 3 refills | Status: AC | PRN

## 2024-03-13 MED ORDER — AZITHROMYCIN 250 MG PO TABS: ORAL_TABLET | ORAL | 0 refills | Status: AC

## 2024-03-13 MED ORDER — SERTRALINE HCL 100 MG PO TABS: 100.0000 mg | ORAL_TABLET | Freq: Every day | ORAL | 3 refills | Status: AC

## 2024-03-13 MED ORDER — METHYLPREDNISOLONE 4 MG PO TBPK: ORAL_TABLET | ORAL | 0 refills | Status: AC

## 2024-03-13 NOTE — Progress Notes (Signed)
 "  New Patient Office Visit  Subjective:  Patient ID: Jill Harrell, female    DOB: 08/27/1960  Age: 64 y.o. MRN: [id number]  CC:  Chief Complaint  Patient presents with   Establish Care    Establish care.    HPI Jill Harrell is a 64 y.o. female with past medical history of COPD, glioblastoma multiforme, GERD, GAD and MDD who presents for establishing care.  COPD: She reports recent worsening of cough and dyspnea for the last 2 weeks.  Denies any fever or chills.  She has Symbicort  currently, but does not use it regularly.  Denies hemoptysis, chest pain or palpitations currently.  She still smokes 0.5 pack/day, but has cut down significantly compared to prior.  History of glioblastoma multiforme: Followed by [facility] oncology.  Gets surveillance MRI.  She also takes Depakote  500 mg nightly for seizure prevention.  GERD: She takes pantoprazole  40 mg twice daily and Pepcid  20 mg twice daily.  Followed by GI currently.  She also has history of chronic constipation, takes Amitiza .  GAD and MDD: She has uncontrolled anxiety currently as she has run out of her Xanax .  She used to take Xanax  1 mg twice daily.  She also takes Zoloft  100 mg QD and trazodone  50 mg nightly.  Denies SI or HI currently.    Past Medical History:  Diagnosis Date   Anxiety    Arthritis    hip and wrist   Brain cancer (HCC)    2009 glioblastoma (stage 4) Dr. Haidee [facility]   Cancer [facility])    Degenerative disc disease at L5-S1 level    GERD (gastroesophageal reflux disease)    Seizures (HCC)    brain cancer    Past Surgical History:  Procedure Laterality Date   ABDOMINAL HYSTERECTOMY     APPENDECTOMY     BIOPSY  04/24/2018   Procedure: BIOPSY;  Surgeon: Shaaron Lamar HERO, MD;  [facility];  Service: Endoscopy;;  gastric   BIOPSY  09/03/2022   Procedure: BIOPSY;  Surgeon: Shaaron Lamar HERO, MD;  [facility];  Service: Endoscopy;;   BRAIN SURGERY X2     CHOLECYSTECTOMY      ESOPHAGOGASTRODUODENOSCOPY (EGD) WITH PROPOFOL  N/A 04/24/2018   Dr. Shaaron: Normal esophagus.  Status post dilation for history of dysphagia.  Diffuse moderate mucosal changes in the entire stomach consistent with portal hypertensive gastropathy.  Biopsy with chronic inactive gastritis, no H. pylori.   ESOPHAGOGASTRODUODENOSCOPY (EGD) WITH PROPOFOL  N/A 09/03/2022   Procedure: ESOPHAGOGASTRODUODENOSCOPY (EGD) WITH PROPOFOL ;  Surgeon: Shaaron Lamar HERO, MD;  [facility];  Service: Endoscopy;  Laterality: N/A;  2:30 pm, asa 3   EXCISIONAL HEMORRHOIDECTOMY     MALONEY DILATION N/A 04/24/2018   Procedure: MALONEY DILATION;  Surgeon: Shaaron Lamar HERO, MD;  [facility];  Service: Endoscopy;  Laterality: N/A;   MALONEY DILATION N/A 09/03/2022   Procedure: AGAPITO DILATION;  Surgeon: Shaaron Lamar HERO, MD;  [facility]: [facility];  Service: Endoscopy;  Laterality: N/A;    Family History  Adopted: Yes  Problem Relation Age of Onset   Heart disease Father    Hypotension Father    Diabetes Father    Allergic rhinitis Father    Hyperlipidemia Maternal Grandfather    Hypertension Maternal Grandfather    Heart disease Maternal Grandfather    Allergic rhinitis Maternal Grandfather    Allergic rhinitis Mother    Allergic rhinitis Maternal Grandmother    Lymphoma Maternal Grandmother  Asthma Brother    Allergic rhinitis Brother    Asthma Brother    Allergic rhinitis Brother    COPD Brother    Allergic rhinitis Brother    Angioedema Neg Hx    Atopy Neg Hx    Eczema Neg Hx    Immunodeficiency Neg Hx    Urticaria Neg Hx    Colon cancer Neg Hx     Social History   Socioeconomic History   Marital status: Married    Spouse name: Not on file   Number of children: Not on file   Years of education: Not on file   Highest education level: Not on file  Occupational History   Not on file  Tobacco Use   Smoking status: Every Day    Current packs/day: 0.50    Average packs/day: 1  pack/day for 24.0 years (23.5 ttl pk-yrs)    Types: Cigarettes    Start date: 02/29/2000   Smokeless tobacco: Never   Tobacco comments:    1/2 ppd as of 03/13/24  Vaping Use   Vaping status: Never Used  Substance and Sexual Activity   Alcohol  use: Not Currently    Comment: special occassion   Drug use: No   Sexual activity: Yes    Birth control/protection: Surgical    Comment: hyst  Other Topics Concern   Not on file  Social History Narrative   Not on file   Social Drivers of Health   Tobacco Use: High Risk (03/13/2024)   Patient History    Smoking Tobacco Use: Every Day    Smokeless Tobacco Use: Never    Passive Exposure: Not on file  Financial Resource Strain: Low Risk (08/10/2021)   Overall Financial Resource Strain (CARDIA)    Difficulty of Paying Living Expenses: Not very hard  Food Insecurity: No Food Insecurity (08/10/2021)   Hunger Vital Sign    Worried About Running Out of Food in the Last Year: Never true    Ran Out of Food in the Last Year: Never true  Transportation Needs: No Transportation Needs (08/10/2021)   PRAPARE - Administrator, Civil Service (Medical): No    Lack of Transportation (Non-Medical): No  Physical Activity: Insufficiently Active (08/10/2021)   Exercise Vital Sign    Days of Exercise per Week: 2 days    Minutes of Exercise per Session: 20 min  Stress: Stress Concern Present (08/10/2021)   [employer] of Occupational Health - Occupational Stress Questionnaire    Feeling of Stress : Rather much  Social Connections: Socially Integrated (08/10/2021)   Social Connection and Isolation Panel    Frequency of Communication with Friends and Family: Once a week    Frequency of Social Gatherings with Friends and Family: Twice a week    Attends Religious Services: More than 4 times per year    Active Member of [employer] or Organizations: Yes    Attends Banker Meetings: More than 4 times per year    Marital Status: Married   Catering Manager Violence: Not At Risk (08/10/2021)   Humiliation, Afraid, Rape, and Kick questionnaire    Fear of Current or Ex-Partner: No    Emotionally Abused: No    Physically Abused: No    Sexually Abused: No  Depression (PHQ2-9): High Risk (03/13/2024)   Depression (PHQ2-9)    PHQ-2 Score: 17  Alcohol  Screen: Low Risk (08/10/2021)   Alcohol  Screen    Last Alcohol  Screening Score (AUDIT): 0  Housing: Medium Risk (08/10/2021)  Housing    Last Housing Risk Score: 1  Utilities: Not on file  Health Literacy: Not on file    ROS Review of Systems  Constitutional:  Negative for chills and fever.  HENT:  Positive for congestion and ear pain (Right sided). Negative for sore throat.   Eyes:  Negative for pain and discharge.  Respiratory:  Positive for cough and shortness of breath.   Cardiovascular:  Negative for chest pain and palpitations.  Gastrointestinal:  Negative for diarrhea, nausea and vomiting.  Endocrine: Negative for polydipsia and polyuria.  Genitourinary:  Negative for dysuria and hematuria.  Musculoskeletal:  Negative for neck pain and neck stiffness.  Skin:  Negative for rash.  Neurological:  Negative for dizziness and weakness.  Psychiatric/Behavioral:  Positive for dysphoric mood and sleep disturbance. Negative for agitation and behavioral problems. The patient is nervous/anxious.     Objective:   Today's Vitals: BP 132/81   Pulse 79   Ht 5' 5 (1.651 m)   Wt 168 lb 6.4 oz (76.4 kg)   SpO2 93%   BMI 28.02 kg/m   Physical Exam Vitals reviewed.  Constitutional:      General: She is not in acute distress.    Appearance: She is not diaphoretic.  HENT:     Head:     Comments: S/p craniotomy, scar C/D/I    Right Ear: External ear normal. Tenderness present.     Left Ear: External ear normal.     Nose: Nose normal.     Mouth/Throat:     Mouth: Mucous membranes are moist.  Eyes:     General: No scleral icterus.    Extraocular Movements: Extraocular  movements intact.  Cardiovascular:     Rate and Rhythm: Normal rate and regular rhythm.     Heart sounds: Normal heart sounds. No murmur heard. Pulmonary:     Breath sounds: Wheezing (Mild, diffuse) present. No rales.  Musculoskeletal:     Cervical back: Neck supple. No tenderness.     Right lower leg: No edema.     Left lower leg: No edema.  Skin:    General: Skin is warm.     Findings: No rash.  Neurological:     General: No focal deficit present.     Mental Status: She is alert and oriented to person, place, and time.  Psychiatric:        Mood and Affect: Mood normal.        Behavior: Behavior normal.     Assessment & Plan:   Problem List Items Addressed This Visit       Respiratory   Acute bronchitis with COPD (HCC)   Recent worsening of cough and dyspnea likely due to acute bronchitis Also has acute sinusitis symptoms currently Started empiric azithromycin  Started Medrol  Dosepak Robitussin as needed for cough Switch to to Advair from Symbicort  due to cost concern, was noncompliant to Symbicort  anyways Albuterol  as needed for dyspnea or wheezing Explained about the role of maintenance and rescue inhaler, she expressed understanding.      Relevant Medications   fluticasone -salmeterol (ADVAIR) 250-50 MCG/ACT AEPB   albuterol  (VENTOLIN  HFA) 108 (90 Base) MCG/ACT inhaler   azithromycin  (ZITHROMAX ) 250 MG tablet   methylPREDNISolone  (MEDROL  DOSEPAK) 4 MG TBPK tablet   Acute sinusitis   Nasal congestion and ear pain likely due to sinusitis Started empiric azithromycin  Nasal saline spray as needed for nasal congestion      Relevant Medications   azithromycin  (ZITHROMAX ) 250 MG tablet  methylPREDNISolone  (MEDROL  DOSEPAK) 4 MG TBPK tablet     Digestive   GERD (gastroesophageal reflux disease)   Overall well-controlled with pantoprazole  40 mg BID and Pepcid  20 mg BID        Nervous and Auditory   Glioblastoma determined by biopsy of brain [facility])   S/p  craniotomy and chemoradiation in 2009 Followed by [facility] oncology - gets surveillance MRI of brain On Depakote  500 mg for seizure prophylaxis      Relevant Medications   ALPRAZolam  (XANAX ) 1 MG tablet   azithromycin  (ZITHROMAX ) 250 MG tablet   methylPREDNISolone  (MEDROL  DOSEPAK) 4 MG TBPK tablet     Other   Constipation   Chronic constipation Followed by GI-on Amitiza       GAD (generalized anxiety disorder) - Primary   Uncontrolled recently Refilled Xanax  1 mg BID after reviewing PDMP Continue Zoloft  100 mg QD Continue trazodone  100 mg nightly for insomnia      Relevant Medications   ALPRAZolam  (XANAX ) 1 MG tablet   sertraline  (ZOLOFT ) 100 MG tablet   traZODone  (DESYREL ) 50 MG tablet   Moderate episode of recurrent major depressive disorder (HCC)   Uncontrolled recently due to worsening of anxiety Continue Zoloft  100 mg QD Continue trazodone  100 mg nightly for insomnia Refilled Xanax       Relevant Medications   ALPRAZolam  (XANAX ) 1 MG tablet   sertraline  (ZOLOFT ) 100 MG tablet   traZODone  (DESYREL ) 50 MG tablet   Other Visit Diagnoses       Mixed hyperlipidemia       Relevant Orders   Lipid Profile     Hyperglycemia       Relevant Orders   Hemoglobin A1c         Deferred flu vaccine today as she has acute bronchitis and sinusitis symptoms.  Outpatient Encounter Medications as of 03/13/2024  Medication Sig   albuterol  (VENTOLIN  HFA) 108 (90 Base) MCG/ACT inhaler Inhale 2 puffs into the lungs every 6 (six) hours as needed for wheezing or shortness of breath.   azithromycin  (ZITHROMAX ) 250 MG tablet Take 2 tablets on day 1, then 1 tablet daily on days 2 through 5   divalproex  (DEPAKOTE ) 500 MG DR tablet Take 500 mg by mouth at bedtime.   estradiol  (VIVELLE -DOT) 0.1 MG/24HR patch Place 1 patch (0.1 mg total) onto the skin 2 (two) times a week.   famotidine  (PEPCID ) 20 MG tablet Take 1 tablet (20 mg total) by mouth 2 (two) times daily. As needed.    fluticasone -salmeterol (ADVAIR) 250-50 MCG/ACT AEPB Inhale 1 puff into the lungs in the morning and at bedtime.   lubiprostone  (AMITIZA ) 24 MCG capsule Take 1 capsule (24 mcg total) by mouth 2 (two) times daily with a meal.   methylPREDNISolone  (MEDROL  DOSEPAK) 4 MG TBPK tablet Take as package instructions.   Multiple Vitamin (MULTIVITAMIN) tablet Take 1 tablet by mouth daily.   MYRBETRIQ  50 MG TB24 tablet Take 1 tablet by mouth once daily   Omega-3 Fatty Acids (FISH OIL) 1000 MG CAPS Take by mouth daily.    pantoprazole  (PROTONIX ) 40 MG tablet Take 1 tablet (40 mg total) by mouth 2 (two) times daily before a meal.   Promethazine  HCl (PHENERGAN  PO) Take by mouth.   sucralfate  (CARAFATE ) 1 g tablet Take 1 tablet (1 g total) by mouth 4 (four) times daily -  with meals and at bedtime. As needed   [DISCONTINUED] ALPRAZolam  (XANAX ) 1 MG tablet Take 1 tablet (1 mg total) by mouth 2 (two) times  daily as needed for anxiety.   [DISCONTINUED] budesonide -formoterol  (SYMBICORT ) 160-4.5 MCG/ACT inhaler Inhale 2 puffs into the lungs 2 (two) times daily.   [DISCONTINUED] divalproex  (DEPAKOTE  ER) 500 MG 24 hr tablet Take 500 mg by mouth daily.   [DISCONTINUED] divalproex  (DEPAKOTE ) 500 MG DR tablet Take 3 tablets (1,500 mg total) by mouth at bedtime.   [DISCONTINUED] sertraline  (ZOLOFT ) 100 MG tablet Take 1 tablet (100 mg total) by mouth daily.   [DISCONTINUED] traZODone  (DESYREL ) 50 MG tablet Take 1 tablet (50 mg total) by mouth at bedtime.   [DISCONTINUED] zolpidem  (AMBIEN ) 10 MG tablet    ALPRAZolam  (XANAX ) 1 MG tablet Take 1 tablet (1 mg total) by mouth 2 (two) times daily as needed for anxiety.   sertraline  (ZOLOFT ) 100 MG tablet Take 1 tablet (100 mg total) by mouth daily.   traZODone  (DESYREL ) 50 MG tablet Take 1 tablet (50 mg total) by mouth at bedtime.   No facility-administered encounter medications on file as of 03/13/2024.    Follow-up: Return in about 3 months (around 06/11/2024) for GAD.    Suzzane MARLA Blanch, MD "

## 2024-03-13 NOTE — Assessment & Plan Note (Signed)
 Chronic constipation Followed by Jimmy Amitiza 

## 2024-03-13 NOTE — Assessment & Plan Note (Signed)
 Recent worsening of cough and dyspnea likely due to acute bronchitis Also has acute sinusitis symptoms currently Started empiric azithromycin  Started Medrol  Dosepak Robitussin as needed for cough Switch to to Advair from Symbicort  due to cost concern, was noncompliant to Symbicort  anyways Albuterol  as needed for dyspnea or wheezing Explained about the role of maintenance and rescue inhaler, she expressed understanding.

## 2024-03-13 NOTE — Assessment & Plan Note (Signed)
 Uncontrolled recently Refilled Xanax  1 mg BID after reviewing PDMP Continue Zoloft  100 mg QD Continue trazodone  100 mg nightly for insomnia

## 2024-03-13 NOTE — Assessment & Plan Note (Signed)
 Nasal congestion and ear pain likely due to sinusitis Started empiric azithromycin  Nasal saline spray as needed for nasal congestion

## 2024-03-13 NOTE — Assessment & Plan Note (Signed)
 Overall well-controlled with pantoprazole  40 mg BID and Pepcid  20 mg BID

## 2024-03-13 NOTE — Patient Instructions (Addendum)
 Please start taking Zoloft  100 mg once daily. Please take Xanax  1 mg as needed for anxiety.  Please start using Advair twice daily regularly and use Albuterol  as needed for shortness of breath.  Please continue to take medications as prescribed.  Please continue to follow low salt diet and perform moderate exercise/walking as tolerated.

## 2024-03-13 NOTE — Assessment & Plan Note (Addendum)
 S/p craniotomy and chemoradiation in 2009 Followed by [facility] oncology - gets surveillance MRI of brain On Depakote  500 mg for seizure prophylaxis

## 2024-03-13 NOTE — Assessment & Plan Note (Signed)
 Uncontrolled recently due to worsening of anxiety Continue Zoloft  100 mg QD Continue trazodone  100 mg nightly for insomnia Refilled Xanax 

## 2024-03-14 LAB — CBC WITH DIFFERENTIAL/PLATELET
Basophils Absolute: 0 x10E3/uL (ref 0.0–0.2)
Basos: 1 %
EOS (ABSOLUTE): 0.2 x10E3/uL (ref 0.0–0.4)
Eos: 2 %
Hematocrit: 44.2 % (ref 34.0–46.6)
Hemoglobin: 14.3 g/dL (ref 11.1–15.9)
Immature Grans (Abs): 0 x10E3/uL (ref 0.0–0.1)
Immature Granulocytes: 0 %
Lymphocytes Absolute: 2.6 x10E3/uL (ref 0.7–3.1)
Lymphs: 34 %
MCH: 29.5 pg (ref 26.6–33.0)
MCHC: 32.4 g/dL (ref 31.5–35.7)
MCV: 91 fL (ref 79–97)
Monocytes Absolute: 0.6 x10E3/uL (ref 0.1–0.9)
Monocytes: 8 %
Neutrophils Absolute: 4.2 x10E3/uL (ref 1.4–7.0)
Neutrophils: 54 %
Platelets: 307 x10E3/uL (ref 150–450)
RBC: 4.85 x10E6/uL (ref 3.77–5.28)
RDW: 13.4 % (ref 11.7–15.4)
WBC: 7.6 x10E3/uL (ref 3.4–10.8)

## 2024-03-14 LAB — COMPREHENSIVE METABOLIC PANEL WITH GFR
ALT: 12 IU/L (ref 0–32)
AST: 16 IU/L (ref 0–40)
Albumin: 4 g/dL (ref 3.9–4.9)
Alkaline Phosphatase: 93 IU/L (ref 49–135)
BUN/Creatinine Ratio: 20 (ref 12–28)
BUN: 14 mg/dL (ref 8–27)
Bilirubin Total: <0.2 mg/dL (ref 0.0–1.2)
CO2: 22 mmol/L (ref 20–29)
Calcium: 9.2 mg/dL (ref 8.7–10.3)
Chloride: 104 mmol/L (ref 96–106)
Creatinine, Ser: 0.71 mg/dL (ref 0.57–1.00)
Globulin, Total: 3.2 g/dL (ref 1.5–4.5)
Glucose: 97 mg/dL (ref 70–99)
Potassium: 4.9 mmol/L (ref 3.5–5.2)
Sodium: 141 mmol/L (ref 134–144)
Total Protein: 7.2 g/dL (ref 6.0–8.5)
eGFR: 97 mL/min/1.73

## 2024-03-14 LAB — LIPID PANEL
Chol/HDL Ratio: 4.7 ratio — ABNORMAL HIGH (ref 0.0–4.4)
Cholesterol, Total: 214 mg/dL — ABNORMAL HIGH (ref 100–199)
HDL: 46 mg/dL
LDL Chol Calc (NIH): 121 mg/dL — ABNORMAL HIGH (ref 0–99)
Triglycerides: 270 mg/dL — ABNORMAL HIGH (ref 0–149)
VLDL Cholesterol Cal: 47 mg/dL — ABNORMAL HIGH (ref 5–40)

## 2024-03-14 LAB — VITAMIN D 25 HYDROXY (VIT D DEFICIENCY, FRACTURES): Vit D, 25-Hydroxy: 14.5 ng/mL — ABNORMAL LOW (ref 30.0–100.0)

## 2024-03-14 LAB — HEMOGLOBIN A1C
Est. average glucose Bld gHb Est-mCnc: 120 mg/dL
Hgb A1c MFr Bld: 5.8 % — ABNORMAL HIGH (ref 4.8–5.6)

## 2024-03-24 ENCOUNTER — Ambulatory Visit: Payer: Self-pay | Admitting: Gastroenterology

## 2024-03-24 DIAGNOSIS — E559 Vitamin D deficiency, unspecified: Secondary | ICD-10-CM

## 2024-03-24 MED ORDER — VITAMIN D (ERGOCALCIFEROL) 1.25 MG (50000 UNIT) PO CAPS: 50000.0000 [IU] | ORAL_CAPSULE | ORAL | 0 refills | Status: AC

## 2024-03-26 ENCOUNTER — Ambulatory Visit: Payer: Self-pay

## 2024-03-26 NOTE — Telephone Encounter (Signed)
 FYI Only or Action Required?: Action required by provider: update on patient condition and appt 2/19.  Patient was last seen in primary care on 03/13/2024 by Tobie Suzzane POUR, MD.  Called Nurse Triage reporting Cough.  Symptoms began about a month ago.  Interventions attempted: Prescription medications: zpack, Medrol  dosepak and Rest, hydration, or home remedies.  Symptoms are: unchanged.  Triage Disposition: See Physician Within 24 Hours  Patient/caregiver understands and will follow disposition?: No, wishes to speak with PCP  Message from Antwanette L sent at 03/26/2024 12:05 PM EST  Reason for Triage: The pt reports continued coughing w/ yellow-green phlegm and notes a rattling sensation in the upper chest.   Reason for Disposition  [1] Continuous (nonstop) coughing interferes with work or school AND [2] no improvement using cough treatment per Care Advice  Answer Assessment - Initial Assessment Questions Patient seen 1/13 for coughcongestion, given prednisone  and zpack- has been going on for about 2 months now- -Prednisone  and azithromycin - not real help - just phlegm coming up- otherwise no improvement  Restarted Mucinex 1/20, Vicks cough suppressant Upper chest hurts with coughing so much-  Rattling in lungs when laying down- less with sitting up   Denies SOB, Dizziness. Had fever after completion of ABX but has since resolved.   Appt 2/19 due to schedule. Discussed other offices as option, Urgent Care or ED. Placed on waitlist to be seen sooner.   1. ONSET: When did the cough begin?      2 months  2. SEVERITY: How bad is the cough today?      Moderate  3. SPUTUM: Describe the color of your sputum (e.g., none, dry cough; clear, white, yellow, green)     Yellow-green  4. HEMOPTYSIS: Are you coughing up any blood? If Yes, ask: How much? (e.g., flecks, streaks, tablespoons, etc.)     Denies  5. DIFFICULTY BREATHING: Are you having difficulty breathing? If Yes,  ask: How bad is it? (e.g., mild, moderate, severe)      Denies  6. FEVER: Do you have a fever? If Yes, ask: What is your temperature, how was it measured, and when did it start?     Twice but after finished Prednisone  last week  7. CARDIAC HISTORY: Do you have any history of heart disease? (e.g., heart attack, congestive heart failure)      migraines 8. LUNG HISTORY: Do you have any history of lung disease?  (e.g., pulmonary embolus, asthma, emphysema)     Bronchitis with COPD  9. PE RISK FACTORS: Do you have a history of blood clots? (or: recent major surgery, recent prolonged travel, bedridden)     Denies  10. OTHER SYMPTOMS: Do you have any other symptoms? (e.g., runny nose, wheezing, chest pain)       Runny nose, sneezing  Protocols used: Cough - Acute Productive-A-AH

## 2024-03-27 NOTE — Telephone Encounter (Signed)
 Patient scheduled.

## 2024-04-06 LAB — COLOGUARD

## 2024-04-09 ENCOUNTER — Ambulatory Visit: Payer: Self-pay | Admitting: Family Medicine

## 2024-04-19 ENCOUNTER — Ambulatory Visit: Payer: Self-pay

## 2024-06-19 ENCOUNTER — Ambulatory Visit: Payer: Self-pay | Admitting: Internal Medicine
